# Patient Record
Sex: Female | Born: 1985 | Race: Black or African American | Hispanic: No | Marital: Single | Smoking: Former smoker
Health system: Southern US, Community
[De-identification: ages and names within clinical notes are randomized; demographics above are authoritative.]

## PROBLEM LIST (undated history)

## (undated) DIAGNOSIS — R059 Cough, unspecified: Secondary | ICD-10-CM

## (undated) DIAGNOSIS — J45909 Unspecified asthma, uncomplicated: Secondary | ICD-10-CM

## (undated) DIAGNOSIS — M5431 Sciatica, right side: Secondary | ICD-10-CM

## (undated) DIAGNOSIS — Z8619 Personal history of other infectious and parasitic diseases: Secondary | ICD-10-CM

## (undated) DIAGNOSIS — R05 Cough: Secondary | ICD-10-CM

## (undated) DIAGNOSIS — O24419 Gestational diabetes mellitus in pregnancy, unspecified control: Secondary | ICD-10-CM

## (undated) DIAGNOSIS — R0602 Shortness of breath: Secondary | ICD-10-CM

## (undated) DIAGNOSIS — M549 Dorsalgia, unspecified: Secondary | ICD-10-CM

## (undated) DIAGNOSIS — J449 Chronic obstructive pulmonary disease, unspecified: Secondary | ICD-10-CM

## (undated) DIAGNOSIS — R222 Localized swelling, mass and lump, trunk: Secondary | ICD-10-CM

## (undated) DIAGNOSIS — J40 Bronchitis, not specified as acute or chronic: Secondary | ICD-10-CM

## (undated) DIAGNOSIS — I8390 Asymptomatic varicose veins of unspecified lower extremity: Secondary | ICD-10-CM

## (undated) DIAGNOSIS — R079 Chest pain, unspecified: Secondary | ICD-10-CM

## (undated) DIAGNOSIS — D649 Anemia, unspecified: Secondary | ICD-10-CM

## (undated) DIAGNOSIS — E049 Nontoxic goiter, unspecified: Secondary | ICD-10-CM

## (undated) DIAGNOSIS — E739 Lactose intolerance, unspecified: Secondary | ICD-10-CM

## (undated) HISTORY — DX: Shortness of breath: R06.02

## (undated) HISTORY — DX: Anemia, unspecified: D64.9

## (undated) HISTORY — PX: TONSILLECTOMY: SUR1361

## (undated) HISTORY — DX: Dorsalgia, unspecified: M54.9

## (undated) HISTORY — PX: TONSILECTOMY, ADENOIDECTOMY, BILATERAL MYRINGOTOMY AND TUBES: SHX2538

## (undated) HISTORY — PX: ADENOIDECTOMY: SUR15

## (undated) HISTORY — DX: Nontoxic goiter, unspecified: E04.9

## (undated) HISTORY — DX: Chronic obstructive pulmonary disease, unspecified: J44.9

## (undated) HISTORY — DX: Chest pain, unspecified: R07.9

## (undated) HISTORY — DX: Asymptomatic varicose veins of unspecified lower extremity: I83.90

## (undated) HISTORY — DX: Bronchitis, not specified as acute or chronic: J40

## (undated) HISTORY — DX: Gestational diabetes mellitus in pregnancy, unspecified control: O24.419

## (undated) HISTORY — DX: Lactose intolerance, unspecified: E73.9

## (undated) HISTORY — DX: Localized swelling, mass and lump, trunk: R22.2

## (undated) HISTORY — DX: Personal history of other infectious and parasitic diseases: Z86.19

## (undated) HISTORY — DX: Unspecified asthma, uncomplicated: J45.909

## (undated) HISTORY — DX: Cough: R05

## (undated) HISTORY — DX: Cough, unspecified: R05.9

## (undated) HISTORY — DX: Sciatica, right side: M54.31

---

## 2006-05-05 ENCOUNTER — Emergency Department (HOSPITAL_COMMUNITY): Admission: EM | Admit: 2006-05-05 | Discharge: 2006-05-06 | Payer: Self-pay | Admitting: *Deleted

## 2006-11-01 ENCOUNTER — Emergency Department (HOSPITAL_COMMUNITY): Admission: EM | Admit: 2006-11-01 | Discharge: 2006-11-01 | Payer: Self-pay | Admitting: Emergency Medicine

## 2007-12-08 ENCOUNTER — Emergency Department (HOSPITAL_COMMUNITY): Admission: EM | Admit: 2007-12-08 | Discharge: 2007-12-08 | Payer: Self-pay | Admitting: Emergency Medicine

## 2008-03-20 ENCOUNTER — Emergency Department (HOSPITAL_COMMUNITY): Admission: EM | Admit: 2008-03-20 | Discharge: 2008-03-20 | Payer: Self-pay | Admitting: Emergency Medicine

## 2008-06-08 ENCOUNTER — Emergency Department (HOSPITAL_COMMUNITY): Admission: EM | Admit: 2008-06-08 | Discharge: 2008-06-08 | Payer: Self-pay | Admitting: Emergency Medicine

## 2009-12-01 DIAGNOSIS — Z8619 Personal history of other infectious and parasitic diseases: Secondary | ICD-10-CM

## 2009-12-01 HISTORY — DX: Personal history of other infectious and parasitic diseases: Z86.19

## 2010-12-01 NOTE — L&D Delivery Note (Signed)
Delivery Note At 1:25 PM a viable and healthy female was delivered via Vaginal, Spontaneous Delivery (Presentation: Right Occiput Anterior).  APGAR: , ; weight 7 lb 3.3 oz (3269 g).   Placenta status: Intact, Spontaneous.  Cord: 2 vessels with the following complications: None.  Anesthesia: Epidural Local   Lacerations: 2nd degree Suture Repair: 3.0 vicryl Est. Blood Loss (mL): 400  Mom to postpartum.  Baby to nursery-stable.  Denyce Harr D 08/19/2011, 1:55 PM

## 2011-01-16 LAB — RPR: RPR: NONREACTIVE

## 2011-01-16 LAB — ANTIBODY SCREEN: Antibody Screen: NEGATIVE

## 2011-01-16 LAB — GC/CHLAMYDIA PROBE AMP, GENITAL: Gonorrhea: POSITIVE

## 2011-01-16 LAB — ABO/RH

## 2011-01-16 LAB — HIV ANTIBODY (ROUTINE TESTING W REFLEX): HIV: NONREACTIVE

## 2011-02-18 ENCOUNTER — Other Ambulatory Visit (HOSPITAL_COMMUNITY): Payer: Self-pay | Admitting: Obstetrics and Gynecology

## 2011-02-18 DIAGNOSIS — O2690 Pregnancy related conditions, unspecified, unspecified trimester: Secondary | ICD-10-CM

## 2011-02-26 ENCOUNTER — Other Ambulatory Visit (HOSPITAL_COMMUNITY): Payer: Self-pay | Admitting: Obstetrics and Gynecology

## 2011-02-26 ENCOUNTER — Ambulatory Visit (HOSPITAL_COMMUNITY)
Admission: RE | Admit: 2011-02-26 | Discharge: 2011-02-26 | Disposition: A | Discharge status: Home / Self Care | Payer: BC Managed Care – PPO | Source: Ambulatory Visit | Attending: Obstetrics and Gynecology | Admitting: Obstetrics and Gynecology

## 2011-02-26 ENCOUNTER — Encounter (HOSPITAL_COMMUNITY): Payer: Self-pay

## 2011-02-26 DIAGNOSIS — O2690 Pregnancy related conditions, unspecified, unspecified trimester: Secondary | ICD-10-CM

## 2011-02-26 DIAGNOSIS — Z3689 Encounter for other specified antenatal screening: Secondary | ICD-10-CM | POA: Insufficient documentation

## 2011-03-19 ENCOUNTER — Ambulatory Visit (HOSPITAL_COMMUNITY)
Admission: RE | Admit: 2011-03-19 | Discharge: 2011-03-19 | Disposition: A | Discharge status: Home / Self Care | Payer: BC Managed Care – PPO | Source: Ambulatory Visit | Attending: Obstetrics and Gynecology | Admitting: Obstetrics and Gynecology

## 2011-03-19 ENCOUNTER — Other Ambulatory Visit (HOSPITAL_COMMUNITY): Payer: Self-pay | Admitting: Obstetrics and Gynecology

## 2011-03-19 DIAGNOSIS — Z363 Encounter for antenatal screening for malformations: Secondary | ICD-10-CM | POA: Insufficient documentation

## 2011-03-19 DIAGNOSIS — O2690 Pregnancy related conditions, unspecified, unspecified trimester: Secondary | ICD-10-CM

## 2011-03-19 DIAGNOSIS — O26879 Cervical shortening, unspecified trimester: Secondary | ICD-10-CM | POA: Insufficient documentation

## 2011-03-19 DIAGNOSIS — Z1389 Encounter for screening for other disorder: Secondary | ICD-10-CM | POA: Insufficient documentation

## 2011-03-19 DIAGNOSIS — O358XX0 Maternal care for other (suspected) fetal abnormality and damage, not applicable or unspecified: Secondary | ICD-10-CM | POA: Insufficient documentation

## 2011-04-23 ENCOUNTER — Ambulatory Visit (HOSPITAL_COMMUNITY)
Admission: RE | Admit: 2011-04-23 | Discharge: 2011-04-23 | Disposition: A | Discharge status: Home / Self Care | Payer: BC Managed Care – PPO | Source: Ambulatory Visit | Attending: Obstetrics and Gynecology | Admitting: Obstetrics and Gynecology

## 2011-04-23 DIAGNOSIS — O26879 Cervical shortening, unspecified trimester: Secondary | ICD-10-CM | POA: Insufficient documentation

## 2011-04-23 DIAGNOSIS — O358XX0 Maternal care for other (suspected) fetal abnormality and damage, not applicable or unspecified: Secondary | ICD-10-CM | POA: Insufficient documentation

## 2011-04-23 DIAGNOSIS — O2690 Pregnancy related conditions, unspecified, unspecified trimester: Secondary | ICD-10-CM

## 2011-07-28 ENCOUNTER — Emergency Department (HOSPITAL_COMMUNITY): Payer: BC Managed Care – PPO

## 2011-07-28 ENCOUNTER — Emergency Department (HOSPITAL_COMMUNITY)
Admission: EM | Admit: 2011-07-28 | Discharge: 2011-07-29 | Disposition: A | Discharge status: Home / Self Care | Payer: BC Managed Care – PPO | Attending: Emergency Medicine | Admitting: Emergency Medicine

## 2011-07-28 DIAGNOSIS — R059 Cough, unspecified: Secondary | ICD-10-CM | POA: Insufficient documentation

## 2011-07-28 DIAGNOSIS — R1110 Vomiting, unspecified: Secondary | ICD-10-CM | POA: Insufficient documentation

## 2011-07-28 DIAGNOSIS — J40 Bronchitis, not specified as acute or chronic: Secondary | ICD-10-CM | POA: Insufficient documentation

## 2011-07-28 DIAGNOSIS — R079 Chest pain, unspecified: Secondary | ICD-10-CM | POA: Insufficient documentation

## 2011-07-28 DIAGNOSIS — O99891 Other specified diseases and conditions complicating pregnancy: Secondary | ICD-10-CM | POA: Insufficient documentation

## 2011-07-28 DIAGNOSIS — R05 Cough: Secondary | ICD-10-CM | POA: Insufficient documentation

## 2011-07-28 DIAGNOSIS — R062 Wheezing: Secondary | ICD-10-CM | POA: Insufficient documentation

## 2011-07-28 DIAGNOSIS — R0602 Shortness of breath: Secondary | ICD-10-CM | POA: Insufficient documentation

## 2011-08-15 ENCOUNTER — Telehealth (HOSPITAL_COMMUNITY): Payer: Self-pay | Admitting: *Deleted

## 2011-08-15 ENCOUNTER — Encounter (HOSPITAL_COMMUNITY): Payer: Self-pay | Admitting: *Deleted

## 2011-08-15 NOTE — Telephone Encounter (Signed)
Preadmission screen  

## 2011-08-18 ENCOUNTER — Other Ambulatory Visit: Payer: Self-pay | Admitting: Obstetrics and Gynecology

## 2011-08-19 ENCOUNTER — Encounter (HOSPITAL_COMMUNITY): Payer: Self-pay

## 2011-08-19 ENCOUNTER — Inpatient Hospital Stay (HOSPITAL_COMMUNITY)
Admission: RE | Admit: 2011-08-19 | Discharge: 2011-08-21 | DRG: 373 | Disposition: A | Discharge status: Home / Self Care | Payer: BC Managed Care – PPO | Source: Ambulatory Visit | Attending: Obstetrics and Gynecology | Admitting: Obstetrics and Gynecology

## 2011-08-19 ENCOUNTER — Encounter (HOSPITAL_COMMUNITY): Payer: Self-pay | Admitting: Anesthesiology

## 2011-08-19 ENCOUNTER — Inpatient Hospital Stay (HOSPITAL_COMMUNITY): Payer: BC Managed Care – PPO | Admitting: Anesthesiology

## 2011-08-19 DIAGNOSIS — Z22330 Carrier of Group B streptococcus: Secondary | ICD-10-CM

## 2011-08-19 DIAGNOSIS — Z3490 Encounter for supervision of normal pregnancy, unspecified, unspecified trimester: Secondary | ICD-10-CM

## 2011-08-19 DIAGNOSIS — O99892 Other specified diseases and conditions complicating childbirth: Secondary | ICD-10-CM | POA: Diagnosis present

## 2011-08-19 LAB — CBC
Hemoglobin: 10.2 g/dL — ABNORMAL LOW (ref 12.0–15.0)
MCH: 26.4 pg (ref 26.0–34.0)
MCV: 82.6 fL (ref 78.0–100.0)
RBC: 3.86 MIL/uL — ABNORMAL LOW (ref 3.87–5.11)

## 2011-08-19 LAB — ABO/RH: ABO/RH(D): O POS

## 2011-08-19 LAB — RPR: RPR Ser Ql: NONREACTIVE

## 2011-08-19 MED ORDER — METHYLERGONOVINE MALEATE 0.2 MG/ML IJ SOLN: 0.2000 mg | INTRAMUSCULAR | Status: DC | PRN

## 2011-08-19 MED ORDER — EPHEDRINE 5 MG/ML INJ
10.0000 mg | INTRAVENOUS | Status: DC | PRN
  Filled 2011-08-19: qty 4

## 2011-08-19 MED ORDER — OXYTOCIN BOLUS FROM INFUSION
500.0000 mL | Freq: Once | INTRAVENOUS | Status: DC
  Filled 2011-08-19: qty 500

## 2011-08-19 MED ORDER — FENTANYL 2.5 MCG/ML BUPIVACAINE 1/10 % EPIDURAL INFUSION (WH - ANES)
INTRAMUSCULAR | Status: AC
  Filled 2011-08-19: qty 60

## 2011-08-19 MED ORDER — SENNOSIDES-DOCUSATE SODIUM 8.6-50 MG PO TABS
2.0000 | ORAL_TABLET | Freq: Every day | ORAL | Status: DC
  Administered 2011-08-19 – 2011-08-20 (×2): 2 via ORAL

## 2011-08-19 MED ORDER — OXYTOCIN 20 UNITS IN LACTATED RINGERS INFUSION - SIMPLE: 125.0000 mL/h | Freq: Once | INTRAVENOUS | Status: DC

## 2011-08-19 MED ORDER — PENICILLIN G POTASSIUM 5000000 UNITS IJ SOLR
2.5000 10*6.[IU] | INTRAMUSCULAR | Status: DC
  Filled 2011-08-19 (×5): qty 2.5

## 2011-08-19 MED ORDER — FENTANYL 2.5 MCG/ML BUPIVACAINE 1/10 % EPIDURAL INFUSION (WH - ANES)
INTRAMUSCULAR | Status: DC | PRN
  Administered 2011-08-19: 14 mL/h via EPIDURAL

## 2011-08-19 MED ORDER — ZOLPIDEM TARTRATE 5 MG PO TABS: 5.0000 mg | ORAL_TABLET | Freq: Every evening | ORAL | Status: DC | PRN

## 2011-08-19 MED ORDER — PENICILLIN G POTASSIUM 5000000 UNITS IJ SOLR
5.0000 10*6.[IU] | Freq: Once | INTRAVENOUS | Status: DC
  Filled 2011-08-19 (×2): qty 5

## 2011-08-19 MED ORDER — PHENYLEPHRINE 40 MCG/ML (10ML) SYRINGE FOR IV PUSH (FOR BLOOD PRESSURE SUPPORT)
PREFILLED_SYRINGE | INTRAVENOUS | Status: AC
  Filled 2011-08-19: qty 5

## 2011-08-19 MED ORDER — LACTATED RINGERS IV SOLN: 500.0000 mL | INTRAVENOUS | Status: DC | PRN

## 2011-08-19 MED ORDER — PRENATAL PLUS 27-1 MG PO TABS: 1.0000 | ORAL_TABLET | Freq: Every day | ORAL | Status: DC

## 2011-08-19 MED ORDER — ONDANSETRON HCL 4 MG PO TABS: 4.0000 mg | ORAL_TABLET | ORAL | Status: DC | PRN

## 2011-08-19 MED ORDER — SIMETHICONE 80 MG PO CHEW: 80.0000 mg | CHEWABLE_TABLET | ORAL | Status: DC | PRN

## 2011-08-19 MED ORDER — METHYLERGONOVINE MALEATE 0.2 MG PO TABS: 0.2000 mg | ORAL_TABLET | ORAL | Status: DC | PRN

## 2011-08-19 MED ORDER — DIPHENHYDRAMINE HCL 25 MG PO CAPS: 25.0000 mg | ORAL_CAPSULE | Freq: Four times a day (QID) | ORAL | Status: DC | PRN

## 2011-08-19 MED ORDER — LIDOCAINE HCL (PF) 1 % IJ SOLN
30.0000 mL | INTRAMUSCULAR | Status: DC | PRN
  Filled 2011-08-19 (×2): qty 30

## 2011-08-19 MED ORDER — ONDANSETRON HCL 4 MG/2ML IJ SOLN: 4.0000 mg | INTRAMUSCULAR | Status: DC | PRN

## 2011-08-19 MED ORDER — TERBUTALINE SULFATE 1 MG/ML IJ SOLN: 0.2500 mg | Freq: Once | INTRAMUSCULAR | Status: DC | PRN

## 2011-08-19 MED ORDER — LACTATED RINGERS IV SOLN: INTRAVENOUS | Status: DC

## 2011-08-19 MED ORDER — BENZOCAINE-MENTHOL 20-0.5 % EX AERO
INHALATION_SPRAY | CUTANEOUS | Status: AC
  Filled 2011-08-19: qty 56

## 2011-08-19 MED ORDER — MEASLES, MUMPS & RUBELLA VAC ~~LOC~~ INJ
0.5000 mL | INJECTION | Freq: Once | SUBCUTANEOUS | Status: DC
  Filled 2011-08-19: qty 0.5

## 2011-08-19 MED ORDER — IBUPROFEN 600 MG PO TABS
600.0000 mg | ORAL_TABLET | Freq: Four times a day (QID) | ORAL | Status: DC
  Administered 2011-08-20 – 2011-08-21 (×7): 600 mg via ORAL
  Filled 2011-08-19 (×7): qty 1

## 2011-08-19 MED ORDER — LACTATED RINGERS IV SOLN: 500.0000 mL | Freq: Once | INTRAVENOUS | Status: DC

## 2011-08-19 MED ORDER — LIDOCAINE HCL 1.5 % IJ SOLN
INTRAMUSCULAR | Status: DC | PRN
  Administered 2011-08-19 (×2): 5 mL via EPIDURAL

## 2011-08-19 MED ORDER — MAGNESIUM HYDROXIDE 400 MG/5ML PO SUSP: 30.0000 mL | ORAL | Status: DC | PRN

## 2011-08-19 MED ORDER — PHENYLEPHRINE 40 MCG/ML (10ML) SYRINGE FOR IV PUSH (FOR BLOOD PRESSURE SUPPORT)
80.0000 ug | PREFILLED_SYRINGE | INTRAVENOUS | Status: DC | PRN
  Administered 2011-08-19: 80 ug via INTRAVENOUS
  Filled 2011-08-19: qty 5

## 2011-08-19 MED ORDER — IBUPROFEN 600 MG PO TABS: 600.0000 mg | ORAL_TABLET | Freq: Four times a day (QID) | ORAL | Status: DC | PRN

## 2011-08-19 MED ORDER — DIPHENHYDRAMINE HCL 50 MG/ML IJ SOLN: 12.5000 mg | INTRAMUSCULAR | Status: DC | PRN

## 2011-08-19 MED ORDER — CITRIC ACID-SODIUM CITRATE 334-500 MG/5ML PO SOLN: 30.0000 mL | ORAL | Status: DC | PRN

## 2011-08-19 MED ORDER — ONDANSETRON HCL 4 MG/2ML IJ SOLN: 4.0000 mg | Freq: Four times a day (QID) | INTRAMUSCULAR | Status: DC | PRN

## 2011-08-19 MED ORDER — ACETAMINOPHEN 325 MG PO TABS: 650.0000 mg | ORAL_TABLET | ORAL | Status: DC | PRN

## 2011-08-19 MED ORDER — LANOLIN HYDROUS EX OINT: TOPICAL_OINTMENT | CUTANEOUS | Status: DC | PRN

## 2011-08-19 MED ORDER — ALBUTEROL SULFATE HFA 108 (90 BASE) MCG/ACT IN AERS
2.0000 | INHALATION_SPRAY | RESPIRATORY_TRACT | Status: DC | PRN
  Filled 2011-08-19: qty 6.7

## 2011-08-19 MED ORDER — BENZOCAINE-MENTHOL 20-0.5 % EX AERO: 1.0000 "application " | INHALATION_SPRAY | CUTANEOUS | Status: DC | PRN

## 2011-08-19 MED ORDER — FENTANYL 2.5 MCG/ML BUPIVACAINE 1/10 % EPIDURAL INFUSION (WH - ANES): 14.0000 mL/h | INTRAMUSCULAR | Status: DC

## 2011-08-19 MED ORDER — OXYCODONE-ACETAMINOPHEN 5-325 MG PO TABS: 2.0000 | ORAL_TABLET | ORAL | Status: DC | PRN

## 2011-08-19 MED ORDER — PRENATAL PLUS 27-1 MG PO TABS
1.0000 | ORAL_TABLET | Freq: Every day | ORAL | Status: DC
  Administered 2011-08-21: 1 via ORAL
  Filled 2011-08-19: qty 1

## 2011-08-19 MED ORDER — PHENYLEPHRINE 40 MCG/ML (10ML) SYRINGE FOR IV PUSH (FOR BLOOD PRESSURE SUPPORT)
80.0000 ug | PREFILLED_SYRINGE | INTRAVENOUS | Status: DC | PRN
  Filled 2011-08-19: qty 5

## 2011-08-19 MED ORDER — OXYTOCIN 20 UNITS IN LACTATED RINGERS INFUSION - SIMPLE
1.0000 m[IU]/min | INTRAVENOUS | Status: DC
  Administered 2011-08-19: 2 m[IU]/min via INTRAVENOUS
  Filled 2011-08-19: qty 1000

## 2011-08-19 MED ORDER — DIBUCAINE 1 % RE OINT: 1.0000 "application " | TOPICAL_OINTMENT | RECTAL | Status: DC | PRN

## 2011-08-19 MED ORDER — TETANUS-DIPHTH-ACELL PERTUSSIS 5-2.5-18.5 LF-MCG/0.5 IM SUSP
0.5000 mL | Freq: Once | INTRAMUSCULAR | Status: AC
  Administered 2011-08-20: 0.5 mL via INTRAMUSCULAR
  Filled 2011-08-19: qty 0.5

## 2011-08-19 MED ORDER — WITCH HAZEL-GLYCERIN EX PADS: 1.0000 "application " | MEDICATED_PAD | CUTANEOUS | Status: DC | PRN

## 2011-08-19 MED ORDER — OXYCODONE-ACETAMINOPHEN 5-325 MG PO TABS: 1.0000 | ORAL_TABLET | ORAL | Status: DC | PRN

## 2011-08-19 MED ORDER — EPHEDRINE 5 MG/ML INJ
INTRAVENOUS | Status: AC
  Filled 2011-08-19: qty 4

## 2011-08-19 NOTE — H&P (Signed)
Candice Kane is a 25 y.o. female presenting for induction of labor given 39+ weeks with favorable cervix.  PNC with possible early cystic hygroma that resolved, otherwise uncomplicated.  See prenatal records for complete history. Maternal Medical History:  Reason for admission: Induction of labor  Fetal activity: Perceived fetal activity is normal.    Prenatal complications: no prenatal complications   OB History    Grav Para Term Preterm Abortions TAB SAB Ect Mult Living   1              Past Medical History  Diagnosis Date  . History of chlamydia 2011  . Bronchitis     recent occurance   Past Surgical History  Procedure Date  . Hernia repair    Family History: family history includes COPD in her maternal grandfather; Cancer in her maternal grandmother and mother; and Diabetes in her father. Social History:  reports that she has never smoked. She has never used smokeless tobacco. She reports that she does not drink alcohol or use illicit drugs.  Review of Systems  Respiratory: Negative.   Cardiovascular: Negative.    On admission, VE 3/80/-1, vtx, AROM clear Dilation: 10 Effacement (%): 80 Station: +1 Exam by:: D Herr rn Blood pressure 151/86, pulse 85, temperature 98.4 F (36.9 C), temperature source Oral, resp. rate 22, height 6\' 1"  (1.854 m), weight 146.965 kg (324 lb), last menstrual period 11/15/2010, SpO2 100.00%. Maternal Exam:  Uterine Assessment: Contraction strength is firm.  Contraction frequency is regular.   Abdomen: Patient reports no abdominal tenderness. Estimated fetal weight is 8 1/2 lbs.   Fetal presentation: vertex  Introitus: Normal vulva. Normal vagina.    Physical Exam  Constitutional: She appears well-developed and well-nourished.  Neck: Neck supple. No thyromegaly present.  Cardiovascular: Normal rate, regular rhythm and normal heart sounds.   Respiratory: Effort normal and breath sounds normal.    Prenatal labs: ABO, Rh: O/Positive/--  (02/16 0000) Antibody: Negative (02/16 0000) Rubella:  Immune RPR: Nonreactive (02/16 0000)  HBsAg: Negative (02/16 0000)  HIV: Non-reactive (02/16 0000)  GBS: Positive (09/18 0000)   Assessment/Plan: IUP at 39+ weeks with favorable cervix for induction.  Has progressed well with pitocin and AROM, now complete and ready to push.  Anticipate SVD.  Has received PCN for +GBS.    Lima Chillemi D 08/19/2011, 1:03 PM

## 2011-08-19 NOTE — Anesthesia Preprocedure Evaluation (Signed)
Anesthesia Evaluation  Name, MR# and DOB Patient awake  General Assessment Comment  Reviewed: Allergy & Precautions, H&P , NPO status , Patient's Chart, lab work & pertinent test results  Airway Mallampati: II TM Distance: >3 FB     Dental  (+) Teeth Intact   Pulmonary  asthma COPD inhaler    pulmonary exam normalPulmonary Exam Normal     Cardiovascular     Neuro/Psych Negative Neurological ROS  Negative Psych ROS  GI/Hepatic/Renal negative GI ROS  negative Liver ROS  negative Renal ROS        Endo/Other  Negative Endocrine ROS (+)      Abdominal (+) obese,   Musculoskeletal negative musculoskeletal ROS (+)   Hematology negative hematology ROS (+)   Peds  Reproductive/Obstetrics (+) Pregnancy    Anesthesia Other Findings             Anesthesia Physical Anesthesia Plan  ASA: III  Anesthesia Plan: Epidural   Post-op Pain Management:    Induction:   Airway Management Planned:   Additional Equipment:   Intra-op Plan:   Post-operative Plan:   Informed Consent: I have reviewed the patients History and Physical, chart, labs and discussed the procedure including the risks, benefits and alternatives for the proposed anesthesia with the patient or authorized representative who has indicated his/her understanding and acceptance.     Plan Discussed with:   Anesthesia Plan Comments:         Anesthesia Quick Evaluation

## 2011-08-19 NOTE — Anesthesia Procedure Notes (Addendum)
Epidural Patient location during procedure: OB Start time: 08/19/2011 11:00 AM End time: 08/19/2011 11:08 AM Reason for block: procedure for pain  Staffing Anesthesiologist: Sandrea Hughs Performed by: anesthesiologist   Preanesthetic Checklist Completed: patient identified, site marked, surgical consent, pre-op evaluation, timeout performed, IV checked, risks and benefits discussed and monitors and equipment checked  Epidural Patient position: sitting Prep: site prepped and draped and DuraPrep Patient monitoring: continuous pulse ox and blood pressure Approach: midline Injection technique: LOR air  Needle:  Needle type: Tuohy  Needle gauge: 17 G Needle length: 9 cm Needle insertion depth: 9 cm Catheter type: closed end flexible Catheter size: 19 Gauge Catheter at skin depth: 15 cm Test dose: negative and 1.5% lidocaine  Assessment Sensory level: T10 Events: blood not aspirated, injection not painful, no injection resistance, negative IV test and no paresthesia

## 2011-08-20 NOTE — Progress Notes (Signed)
Afebrile, vss. Fundus firm, U/1. Small amt lochia, voiding without difficulty, + flatus.  Baby nursing for short periods of time.Plan for d/c tomorrow.

## 2011-08-20 NOTE — Progress Notes (Signed)
UR chart review completed.  

## 2011-08-20 NOTE — Anesthesia Postprocedure Evaluation (Signed)
  Anesthesia Post-op Note  Patient: Candice Kane  Procedure(s) Performed: * No procedures listed *  Patient Location: PACU and Mother/Baby  Anesthesia Type: Epidural  Level of Consciousness: awake, alert  and oriented  Airway and Oxygen Therapy: Patient Spontanous Breathing  Post-op Pain: mild  Post-op Assessment: Patient's Cardiovascular Status Stable, Respiratory Function Stable, No headache, No backache, No residual numbness and No residual motor weakness  Post-op Vital Signs: stable  Complications: No apparent anesthesia complications

## 2011-08-21 MED ORDER — IBUPROFEN 600 MG PO TABS: 600.0000 mg | ORAL_TABLET | Freq: Four times a day (QID) | ORAL | Status: AC

## 2011-08-21 NOTE — Progress Notes (Signed)
Pt feeling well, ready to go home. VSS, voiding without difficulty, no BM yet. Fundus U/2, firm. Small amt bleeding. Nursing well. Ibuprofen for cramping with good relief.

## 2011-08-21 NOTE — Discharge Summary (Signed)
Obstetric Discharge Summary Reason for Admission: induction of labor Prenatal Procedures: none Intrapartum Procedures: spontaneous vaginal delivery Postpartum Procedures: none Complications-Operative and Postpartum: 2nd degree perineal laceration Hemoglobin  Date Value Range Status  08/19/2011 10.2* 12.0-15.0 (g/dL) Final     HCT  Date Value Range Status  08/19/2011 31.9* 36.0-46.0 (%) Final    Discharge Diagnoses: Term Pregnancy-delivered  Discharge Information: Date: 08/21/2011 Activity: pelvic rest Diet: routine Medications: Ibuprophen Condition: stable Instructions: refer to practice specific booklet Discharge to: home Follow-up Information    Follow up with Sylvester Salonga D. Make an appointment in 6 weeks.   Contact information:   [address] [place] [address] [phone]          Newborn Data: Live born female  Birth Weight: 7 lb 3.3 oz (3269 g) APGAR: 9,   Home with mother.  Maisen Klingler D 08/21/2011, 8:17 AM

## 2011-08-22 NOTE — Anesthesia Postprocedure Evaluation (Signed)
Anesthesia Post Note  Patient: Candice Kane  Procedure(s) Performed: * No procedures listed *  Anesthesia type: Epidural  Patient location: Mother/Baby  Post pain: Pain level controlled  Post assessment: Post-op Vital signs reviewed  Last Vitals: There were no vitals filed for this visit.  Post vital signs: Reviewed  Level of consciousness: awake  Complications: No apparent anesthesia complications

## 2011-08-28 LAB — CBC
HCT: 32.6 — ABNORMAL LOW
Hemoglobin: 10.7 — ABNORMAL LOW
MCHC: 32.7
MCV: 84.6
RBC: 3.86 — ABNORMAL LOW
RDW: 14.7

## 2011-08-28 LAB — WET PREP, GENITAL
Trich, Wet Prep: NONE SEEN
Yeast Wet Prep HPF POC: NONE SEEN

## 2011-08-28 LAB — DIFFERENTIAL
Basophils Relative: 1
Eosinophils Absolute: 0
Eosinophils Relative: 0
Monocytes Absolute: 0.5
Monocytes Relative: 6

## 2011-08-28 LAB — POCT I-STAT, CHEM 8
Calcium, Ion: 1.2
Chloride: 101
Glucose, Bld: 75
HCT: 35 — ABNORMAL LOW
TCO2: 29

## 2011-08-28 LAB — URINALYSIS, ROUTINE W REFLEX MICROSCOPIC
Nitrite: NEGATIVE
Specific Gravity, Urine: 1.025
Urobilinogen, UA: 0.2
pH: 7

## 2012-02-18 ENCOUNTER — Encounter (HOSPITAL_COMMUNITY): Payer: Self-pay | Admitting: *Deleted

## 2012-02-18 ENCOUNTER — Emergency Department (HOSPITAL_COMMUNITY)
Admission: EM | Admit: 2012-02-18 | Discharge: 2012-02-18 | Disposition: A | Discharge status: Home / Self Care | Payer: BC Managed Care – PPO | Attending: Emergency Medicine | Admitting: Emergency Medicine

## 2012-02-18 DIAGNOSIS — J020 Streptococcal pharyngitis: Secondary | ICD-10-CM | POA: Insufficient documentation

## 2012-02-18 DIAGNOSIS — H9209 Otalgia, unspecified ear: Secondary | ICD-10-CM | POA: Insufficient documentation

## 2012-02-18 MED ORDER — PENICILLIN G BENZATHINE 1200000 UNIT/2ML IM SUSP
1.2000 10*6.[IU] | Freq: Once | INTRAMUSCULAR | Status: AC
  Administered 2012-02-18: 1.2 10*6.[IU] via INTRAMUSCULAR
  Filled 2012-02-18: qty 2

## 2012-02-18 MED ORDER — HYDROCODONE-ACETAMINOPHEN 7.5-500 MG/15ML PO SOLN: 10.0000 mL | Freq: Four times a day (QID) | ORAL | Status: AC | PRN

## 2012-02-18 NOTE — Discharge Instructions (Signed)
FOLLOW UP WITH YOUR DOCTOR FOR ANY PERSISTENT SYMPTOMS. CONTINUE TYLENOL FOR ACHES AND IF YOU DEVELOP ANY FEVER. PUSH FLUIDS. YOU CAN USE HYDROCODONE ELIXIR FOR PAIN AND RECOMMEND WARM SALT WATER GARGLES AS WELL.   Strep Throat Strep throat is an infection of the throat caused by a bacteria named Streptococcus pyogenes. Your caregiver may call the infection streptococcal "tonsillitis" or "pharyngitis" depending on whether there are signs of inflammation in the tonsils or back of the throat. Strep throat is most common in children from 84 to 28 years old during the cold months of the year, but it can occur in people of any age during any season. This infection is spread from person to person (contagious) through coughing, sneezing, or other close contact. SYMPTOMS   Fever or chills.   Painful, swollen, red tonsils or throat.   Pain or difficulty when swallowing.   White or yellow spots on the tonsils or throat.   Swollen, tender lymph nodes or "glands" of the neck or under the jaw.   Red rash all over the body (rare).  DIAGNOSIS  Many different infections can cause the same symptoms. A test must be done to confirm the diagnosis so the right treatment can be given. A "rapid strep test" can help your caregiver make the diagnosis in a few minutes. If this test is not available, a light swab of the infected area can be used for a throat culture test. If a throat culture test is done, results are usually available in a day or two. TREATMENT  Strep throat is treated with antibiotic medicine. HOME CARE INSTRUCTIONS   Gargle with 1 tsp of salt in 1 cup of warm water, 3 to 4 times per day or as needed for comfort.   Family members who also have a sore throat or fever should be tested for strep throat and treated with antibiotics if they have the strep infection.   Make sure everyone in your household washes their hands well.   Do not share food, drinking cups, or personal items that could cause the  infection to spread to others.   You may need to eat a soft food diet until your sore throat gets better.   Drink enough water and fluids to keep your urine clear or pale yellow. This will help prevent dehydration.   Get plenty of rest.   Stay home from school, daycare, or work until you have been on antibiotics for 24 hours.   Only take over-the-counter or prescription medicines for pain, discomfort, or fever as directed by your caregiver.   If antibiotics are prescribed, take them as directed. Finish them even if you start to feel better.  SEEK MEDICAL CARE IF:   The glands in your neck continue to enlarge.   You develop a rash, cough, or earache.   You cough up green, yellow-brown, or bloody sputum.   You have pain or discomfort not controlled by medicines.   Your problems seem to be getting worse rather than better.  SEEK IMMEDIATE MEDICAL CARE IF:   You develop any new symptoms such as vomiting, severe headache, stiff or painful neck, chest pain, shortness of breath, or trouble swallowing.   You develop severe throat pain, drooling, or changes in your voice.   You develop swelling of the neck, or the skin on the neck becomes red and tender.   You have a fever.   You develop signs of dehydration, such as fatigue, dry mouth, and decreased urination.  You become increasingly sleepy, or you cannot wake up completely.  Document Released: 11/14/2000 Document Revised: 11/06/2011 Document Reviewed: 01/16/2011 [facility] Patient Information 2012 [place], [place].

## 2012-02-18 NOTE — ED Notes (Signed)
Patient states bilateral ear pain dull and sore throat 7-10/10. Airway intact bilateral equal chest rise and fall. Ax4

## 2012-02-18 NOTE — ED Provider Notes (Signed)
History     CSN: [id number]  Arrival date & time 02/18/12  7829   First MD Initiated Contact with Patient 02/18/12 0757      Chief Complaint  Patient presents with  . Sore Throat  . Otalgia    (Consider location/radiation/quality/duration/timing/severity/associated sxs/prior treatment) Patient is a 26 y.o. female presenting with pharyngitis. The history is provided by the patient.  Sore Throat This is a new problem. The current episode started in the past 7 days. The problem occurs constantly. The problem has been gradually worsening. Associated symptoms include chills, congestion, coughing, fatigue, a sore throat and swollen glands. Pertinent negatives include no fever, nausea, rash or vomiting. Associated symptoms comments: She states other family members ill as well.. The symptoms are aggravated by swallowing. She has tried acetaminophen for the symptoms.    Past Medical History  Diagnosis Date  . History of chlamydia 2011  . Bronchitis     recent occurance    Past Surgical History  Procedure Date  . Hernia repair     Family History  Problem Relation Age of Onset  . Cancer Mother     breast  . Diabetes Father   . Cancer Maternal Grandmother     colon  . COPD Maternal Grandfather     History  Substance Use Topics  . Smoking status: Never Smoker   . Smokeless tobacco: Never Used  . Alcohol Use: No    OB History    Grav Para Term Preterm Abortions TAB SAB Ect Mult Living   1 1 1              Review of Systems  Constitutional: Positive for chills and fatigue. Negative for fever.  HENT: Positive for ear pain, congestion and sore throat.   Respiratory: Positive for cough. Negative for shortness of breath.   Cardiovascular: Negative.   Gastrointestinal: Negative.  Negative for nausea and vomiting.  Musculoskeletal: Negative.   Skin: Negative.  Negative for rash.  Neurological: Negative.     Allergies  Review of patient's allergies indicates no known  allergies.  Home Medications   Current Outpatient Rx  Name Route Sig Dispense Refill  . CHLORPHEN-PHENYLEPH-ASA 2-7.8-325 MG PO TBEF Oral Take 1 tablet by mouth 2 (two) times daily as needed. For sore throat    . PSEUDOEPH-DOXYLAMINE-DM-APAP 60-7.04-29-999 MG/30ML PO LIQD Oral Take 30 mLs by mouth at bedtime as needed. For cold      BP 138/84  Pulse 109  Temp(Src) 98.3 F (36.8 C) (Oral)  Resp 20  SpO2 96%  Breastfeeding? Unknown  Physical Exam  Constitutional: She appears well-developed and well-nourished.  HENT:  Head: Normocephalic.  Right Ear: Tympanic membrane, external ear and ear canal normal. No middle ear effusion.  Left Ear: Tympanic membrane, external ear and ear canal normal.  No middle ear effusion.  Mouth/Throat: Mucous membranes are not dry. Posterior oropharyngeal erythema present. No oropharyngeal exudate.  Neck: Normal range of motion. Neck supple.  Cardiovascular: Normal rate and regular rhythm.   Pulmonary/Chest: Effort normal and breath sounds normal.  Abdominal: Soft. Bowel sounds are normal. There is no tenderness. There is no rebound and no guarding.  Musculoskeletal: Normal range of motion.  Neurological: She is alert. No cranial nerve deficit.  Skin: Skin is warm and dry. No rash noted.  Psychiatric: She has a normal mood and affect.    ED Course  Procedures (including critical care time)  Labs Reviewed  RAPID STREP SCREEN - Abnormal; Notable for the following:  Streptococcus, Group A Screen (Direct) POSITIVE (*)    All other components within normal limits   No results found.   No diagnosis found.  1. Strep pharyngitis  MDM          Rodena Medin, PA-C 02/18/12 [phone]

## 2012-02-18 NOTE — ED Notes (Signed)
Pt reports sore throat, bilateral ear pain, and generalized body aches since Monday denies fevers, reports the entire house is sick.

## 2012-02-19 NOTE — ED Provider Notes (Signed)
Medical screening examination/treatment/procedure(s) were performed by non-physician practitioner and as supervising physician I was immediately available for consultation/collaboration.   Zyionna Pesce A. Patrica Duel, MD 02/19/12 1610

## 2013-03-01 DIAGNOSIS — E049 Nontoxic goiter, unspecified: Secondary | ICD-10-CM

## 2013-03-01 HISTORY — DX: Nontoxic goiter, unspecified: E04.9

## 2013-03-10 ENCOUNTER — Encounter: Payer: Self-pay | Admitting: Family Medicine

## 2013-03-10 ENCOUNTER — Ambulatory Visit (INDEPENDENT_AMBULATORY_CARE_PROVIDER_SITE_OTHER): Payer: BC Managed Care – PPO | Admitting: Family Medicine

## 2013-03-10 ENCOUNTER — Other Ambulatory Visit (HOSPITAL_COMMUNITY)
Admission: RE | Admit: 2013-03-10 | Discharge: 2013-03-10 | Disposition: A | Discharge status: Home / Self Care | Payer: BC Managed Care – PPO | Source: Ambulatory Visit | Attending: Family Medicine | Admitting: Family Medicine

## 2013-03-10 VITALS — BP 104/60 | HR 84 | Temp 98.9°F | Ht 72.5 in | Wt 308.0 lb

## 2013-03-10 DIAGNOSIS — Z0000 Encounter for general adult medical examination without abnormal findings: Secondary | ICD-10-CM

## 2013-03-10 DIAGNOSIS — I83893 Varicose 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 complications: Secondary | ICD-10-CM | POA: Insufficient documentation

## 2013-03-10 DIAGNOSIS — Z309 Encounter for contraceptive management, unspecified: Secondary | ICD-10-CM

## 2013-03-10 DIAGNOSIS — Z01419 Encounter for gynecological examination (general) (routine) without abnormal findings: Secondary | ICD-10-CM | POA: Insufficient documentation

## 2013-03-10 DIAGNOSIS — E049 Nontoxic goiter, unspecified: Secondary | ICD-10-CM | POA: Insufficient documentation

## 2013-03-10 DIAGNOSIS — Z124 Encounter for screening for malignant neoplasm of cervix: Secondary | ICD-10-CM

## 2013-03-10 DIAGNOSIS — D1779 Benign lipomatous neoplasm of other sites: Secondary | ICD-10-CM

## 2013-03-10 DIAGNOSIS — D171 Benign lipomatous neoplasm of skin and subcutaneous tissue of trunk: Secondary | ICD-10-CM

## 2013-03-10 DIAGNOSIS — IMO0001 Reserved for inherently not codable concepts without codable children: Secondary | ICD-10-CM

## 2013-03-10 LAB — HEPATIC FUNCTION PANEL
Bilirubin, Direct: 0 mg/dL (ref 0.0–0.3)
Total Bilirubin: 0.2 mg/dL — ABNORMAL LOW (ref 0.3–1.2)

## 2013-03-10 LAB — BASIC METABOLIC PANEL
BUN: 7 mg/dL (ref 6–23)
Calcium: 8.5 mg/dL (ref 8.4–10.5)
Chloride: 102 mEq/L (ref 96–112)
Creatinine, Ser: 0.8 mg/dL (ref 0.4–1.2)

## 2013-03-10 LAB — LIPID PANEL
HDL: 28.4 mg/dL — ABNORMAL LOW (ref >39.00)
LDL Cholesterol: 128 mg/dL — ABNORMAL HIGH (ref 0–99)
Total CHOL/HDL Ratio: 6
Triglycerides: 90 mg/dL (ref 0.0–149.0)

## 2013-03-10 LAB — CBC WITH DIFFERENTIAL/PLATELET
Eosinophils Absolute: 0.1 10*3/uL (ref 0.0–0.7)
Eosinophils Relative: 1.2 % (ref 0.0–5.0)
MCHC: 31.8 g/dL (ref 30.0–36.0)
MCV: 81 fl (ref 78.0–100.0)
Monocytes Absolute: 0.3 10*3/uL (ref 0.1–1.0)
Neutrophils Relative %: 53.8 % (ref 43.0–77.0)
Platelets: 366 10*3/uL (ref 150.0–400.0)
WBC: 5.4 10*3/uL (ref 4.5–10.5)

## 2013-03-10 LAB — POCT URINALYSIS DIPSTICK
Blood, UA: NEGATIVE
Nitrite, UA: NEGATIVE
Urobilinogen, UA: 0.2
pH, UA: 6.5

## 2013-03-10 MED ORDER — NORGESTIM-ETH ESTRAD TRIPHASIC 0.18/0.215/0.25 MG-25 MCG PO TABS: ORAL_TABLET | ORAL | Status: DC

## 2013-03-10 NOTE — Patient Instructions (Addendum)
Preventive Care for Adults, Female A healthy lifestyle and preventive care can promote health and wellness. Preventive health guidelines for women include the following key practices.  A routine yearly physical is a good way to check with your caregiver about your health and preventive screening. It is a chance to share any concerns and updates on your health, and to receive a thorough exam.  Visit your dentist for a routine exam and preventive care every 6 months. Brush your teeth twice a day and floss once a day. Good oral hygiene prevents tooth decay and gum disease.  The frequency of eye exams is based on your age, health, family medical history, use of contact lenses, and other factors. Follow your caregiver's recommendations for frequency of eye exams.  Eat a healthy diet. Foods like vegetables, fruits, whole grains, low-fat dairy products, and lean protein foods contain the nutrients you need without too many calories. Decrease your intake of foods high in solid fats, added sugars, and salt. Eat the right amount of calories for you.Get information about a proper diet from your caregiver, if necessary.  Regular physical exercise is one of the most important things you can do for your health. Most adults should get at least 150 minutes of moderate-intensity exercise (any activity that increases your heart rate and causes you to sweat) each week. In addition, most adults need muscle-strengthening exercises on 2 or more days a week.  Maintain a healthy weight. The body mass index (BMI) is a screening tool to identify possible weight problems. It provides an estimate of body fat based on height and weight. Your caregiver can help determine your BMI, and can help you achieve or maintain a healthy weight.For adults 20 years and older:  A BMI below 18.5 is considered underweight.  A BMI of 18.5 to 24.9 is normal.  A BMI of 25 to 29.9 is considered overweight.  A BMI of 30 and above is  considered obese.  Maintain normal blood lipids and cholesterol levels by exercising and minimizing your intake of saturated fat. Eat a balanced diet with plenty of fruit and vegetables. Blood tests for lipids and cholesterol should begin at age 20 and be repeated every 5 years. If your lipid or cholesterol levels are high, you are over 50, or you are at high risk for heart disease, you may need your cholesterol levels checked more frequently.Ongoing high lipid and cholesterol levels should be treated with medicines if diet and exercise are not effective.  If you smoke, find out from your caregiver how to quit. If you do not use tobacco, do not start.  If you are pregnant, do not drink alcohol. If you are breastfeeding, be very cautious about drinking alcohol. If you are not pregnant and choose to drink alcohol, do not exceed 1 drink per day. One drink is considered to be 12 ounces (355 mL) of beer, 5 ounces (148 mL) of wine, or 1.5 ounces (44 mL) of liquor.  Avoid use of street drugs. Do not share needles with anyone. Ask for help if you need support or instructions about stopping the use of drugs.  High blood pressure causes heart disease and increases the risk of stroke. Your blood pressure should be checked at least every 1 to 2 years. Ongoing high blood pressure should be treated with medicines if weight loss and exercise are not effective.  If you are 55 to 27 years old, ask your caregiver if you should take aspirin to prevent strokes.  Diabetes   screening involves taking a blood sample to check your fasting blood sugar level. This should be done once every 3 years, after age 45, if you are within normal weight and without risk factors for diabetes. Testing should be considered at a younger age or be carried out more frequently if you are overweight and have at least 1 risk factor for diabetes.  Breast cancer screening is essential preventive care for women. You should practice "breast  self-awareness." This means understanding the normal appearance and feel of your breasts and may include breast self-examination. Any changes detected, no matter how small, should be reported to a caregiver. Women in their 20s and 30s should have a clinical breast exam (CBE) by a caregiver as part of a regular health exam every 1 to 3 years. After age 40, women should have a CBE every year. Starting at age 40, women should consider having a mammography (breast X-ray test) every year. Women who have a family history of breast cancer should talk to their caregiver about genetic screening. Women at a high risk of breast cancer should talk to their caregivers about having magnetic resonance imaging (MRI) and a mammography every year.  The Pap test is a screening test for cervical cancer. A Pap test can show cell changes on the cervix that might become cervical cancer if left untreated. A Pap test is a procedure in which cells are obtained and examined from the lower end of the uterus (cervix).  Women should have a Pap test starting at age 21.  Between ages 21 and 29, Pap tests should be repeated every 2 years.  Beginning at age 30, you should have a Pap test every 3 years as long as the past 3 Pap tests have been normal.  Some women have medical problems that increase the chance of getting cervical cancer. Talk to your caregiver about these problems. It is especially important to talk to your caregiver if a new problem develops soon after your last Pap test. In these cases, your caregiver may recommend more frequent screening and Pap tests.  The above recommendations are the same for women who have or have not gotten the vaccine for human papillomavirus (HPV).  If you had a hysterectomy for a problem that was not cancer or a condition that could lead to cancer, then you no longer need Pap tests. Even if you no longer need a Pap test, a regular exam is a good idea to make sure no other problems are  starting.  If you are between ages 65 and 70, and you have had normal Pap tests going back 10 years, you no longer need Pap tests. Even if you no longer need a Pap test, a regular exam is a good idea to make sure no other problems are starting.  If you have had past treatment for cervical cancer or a condition that could lead to cancer, you need Pap tests and screening for cancer for at least 20 years after your treatment.  If Pap tests have been discontinued, risk factors (such as a new sexual partner) need to be reassessed to determine if screening should be resumed.  The HPV test is an additional test that may be used for cervical cancer screening. The HPV test looks for the virus that can cause the cell changes on the cervix. The cells collected during the Pap test can be tested for HPV. The HPV test could be used to screen women aged 30 years and older, and should   be used in women of any age who have unclear Pap test results. After the age of 30, women should have HPV testing at the same frequency as a Pap test.  Colorectal cancer can be detected and often prevented. Most routine colorectal cancer screening begins at the age of 50 and continues through age 75. However, your caregiver may recommend screening at an earlier age if you have risk factors for colon cancer. On a yearly basis, your caregiver may provide home test kits to check for hidden blood in the stool. Use of a small camera at the end of a tube, to directly examine the colon (sigmoidoscopy or colonoscopy), can detect the earliest forms of colorectal cancer. Talk to your caregiver about this at age 50, when routine screening begins. Direct examination of the colon should be repeated every 5 to 10 years through age 75, unless early forms of pre-cancerous polyps or small growths are found.  Hepatitis C blood testing is recommended for all people born from 1945 through 1965 and any individual with known risks for hepatitis C.  Practice  safe sex. Use condoms and avoid high-risk sexual practices to reduce the spread of sexually transmitted infections (STIs). STIs include gonorrhea, chlamydia, syphilis, trichomonas, herpes, HPV, and human immunodeficiency virus (HIV). Herpes, HIV, and HPV are viral illnesses that have no cure. They can result in disability, cancer, and death. Sexually active women aged 25 and younger should be checked for chlamydia. Older women with new or multiple partners should also be tested for chlamydia. Testing for other STIs is recommended if you are sexually active and at increased risk.  Osteoporosis is a disease in which the bones lose minerals and strength with aging. This can result in serious bone fractures. The risk of osteoporosis can be identified using a bone density scan. Women ages 65 and over and women at risk for fractures or osteoporosis should discuss screening with their caregivers. Ask your caregiver whether you should take a calcium supplement or vitamin D to reduce the rate of osteoporosis.  Menopause can be associated with physical symptoms and risks. Hormone replacement therapy is available to decrease symptoms and risks. You should talk to your caregiver about whether hormone replacement therapy is right for you.  Use sunscreen with sun protection factor (SPF) of 30 or more. Apply sunscreen liberally and repeatedly throughout the day. You should seek shade when your shadow is shorter than you. Protect yourself by wearing long sleeves, pants, a wide-brimmed hat, and sunglasses year round, whenever you are outdoors.  Once a month, do a whole body skin exam, using a mirror to look at the skin on your back. Notify your caregiver of new moles, moles that have irregular borders, moles that are larger than a pencil eraser, or moles that have changed in shape or color.  Stay current with required immunizations.  Influenza. You need a dose every fall (or winter). The composition of the flu vaccine  changes each year, so being vaccinated once is not enough.  Pneumococcal polysaccharide. You need 1 to 2 doses if you smoke cigarettes or if you have certain chronic medical conditions. You need 1 dose at age 65 (or older) if you have never been vaccinated.  Tetanus, diphtheria, pertussis (Tdap, Td). Get 1 dose of Tdap vaccine if you are younger than age 65, are over 65 and have contact with an infant, are a healthcare worker, are pregnant, or simply want to be protected from whooping cough. After that, you need a Td   booster dose every 10 years. Consult your caregiver if you have not had at least 3 tetanus and diphtheria-containing shots sometime in your life or have a deep or dirty wound.  HPV. You need this vaccine if you are a woman age 26 or younger. The vaccine is given in 3 doses over 6 months.  Measles, mumps, rubella (MMR). You need at least 1 dose of MMR if you were born in 1957 or later. You may also need a second dose.  Meningococcal. If you are age 19 to 21 and a first-year college student living in a residence hall, or have one of several medical conditions, you need to get vaccinated against meningococcal disease. You may also need additional booster doses.  Zoster (shingles). If you are age 60 or older, you should get this vaccine.  Varicella (chickenpox). If you have never had chickenpox or you were vaccinated but received only 1 dose, talk to your caregiver to find out if you need this vaccine.  Hepatitis A. You need this vaccine if you have a specific risk factor for hepatitis A virus infection or you simply wish to be protected from this disease. The vaccine is usually given as 2 doses, 6 to 18 months apart.  Hepatitis B. You need this vaccine if you have a specific risk factor for hepatitis B virus infection or you simply wish to be protected from this disease. The vaccine is given in 3 doses, usually over 6 months. Preventive Services / Frequency Ages 19 to 39  Blood  pressure check.** / Every 1 to 2 years.  Lipid and cholesterol check.** / Every 5 years beginning at age 20.  Clinical breast exam.** / Every 3 years for women in their 20s and 30s.  Pap test.** / Every 2 years from ages 21 through 29. Every 3 years starting at age 30 through age 65 or 70 with a history of 3 consecutive normal Pap tests.  HPV screening.** / Every 3 years from ages 30 through ages 65 to 70 with a history of 3 consecutive normal Pap tests.  Hepatitis C blood test.** / For any individual with known risks for hepatitis C.  Skin self-exam. / Monthly.  Influenza immunization.** / Every year.  Pneumococcal polysaccharide immunization.** / 1 to 2 doses if you smoke cigarettes or if you have certain chronic medical conditions.  Tetanus, diphtheria, pertussis (Tdap, Td) immunization. / A one-time dose of Tdap vaccine. After that, you need a Td booster dose every 10 years.  HPV immunization. / 3 doses over 6 months, if you are 26 and younger.  Measles, mumps, rubella (MMR) immunization. / You need at least 1 dose of MMR if you were born in 1957 or later. You may also need a second dose.  Meningococcal immunization. / 1 dose if you are age 19 to 21 and a first-year college student living in a residence hall, or have one of several medical conditions, you need to get vaccinated against meningococcal disease. You may also need additional booster doses.  Varicella immunization.** / Consult your caregiver.  Hepatitis A immunization.** / Consult your caregiver. 2 doses, 6 to 18 months apart.  Hepatitis B immunization.** / Consult your caregiver. 3 doses usually over 6 months. Ages 40 to 64  Blood pressure check.** / Every 1 to 2 years.  Lipid and cholesterol check.** / Every 5 years beginning at age 20.  Clinical breast exam.** / Every year after age 40.  Mammogram.** / Every year beginning at age 40   and continuing for as long as you are in good health. Consult with your  caregiver.  Pap test.** / Every 3 years starting at age 30 through age 65 or 70 with a history of 3 consecutive normal Pap tests.  HPV screening.** / Every 3 years from ages 30 through ages 65 to 70 with a history of 3 consecutive normal Pap tests.  Fecal occult blood test (FOBT) of stool. / Every year beginning at age 50 and continuing until age 75. You may not need to do this test if you get a colonoscopy every 10 years.  Flexible sigmoidoscopy or colonoscopy.** / Every 5 years for a flexible sigmoidoscopy or every 10 years for a colonoscopy beginning at age 50 and continuing until age 75.  Hepatitis C blood test.** / For all people born from 1945 through 1965 and any individual with known risks for hepatitis C.  Skin self-exam. / Monthly.  Influenza immunization.** / Every year.  Pneumococcal polysaccharide immunization.** / 1 to 2 doses if you smoke cigarettes or if you have certain chronic medical conditions.  Tetanus, diphtheria, pertussis (Tdap, Td) immunization.** / A one-time dose of Tdap vaccine. After that, you need a Td booster dose every 10 years.  Measles, mumps, rubella (MMR) immunization. / You need at least 1 dose of MMR if you were born in 1957 or later. You may also need a second dose.  Varicella immunization.** / Consult your caregiver.  Meningococcal immunization.** / Consult your caregiver.  Hepatitis A immunization.** / Consult your caregiver. 2 doses, 6 to 18 months apart.  Hepatitis B immunization.** / Consult your caregiver. 3 doses, usually over 6 months. Ages 65 and over  Blood pressure check.** / Every 1 to 2 years.  Lipid and cholesterol check.** / Every 5 years beginning at age 20.  Clinical breast exam.** / Every year after age 40.  Mammogram.** / Every year beginning at age 40 and continuing for as long as you are in good health. Consult with your caregiver.  Pap test.** / Every 3 years starting at age 30 through age 65 or 70 with a 3  consecutive normal Pap tests. Testing can be stopped between 65 and 70 with 3 consecutive normal Pap tests and no abnormal Pap or HPV tests in the past 10 years.  HPV screening.** / Every 3 years from ages 30 through ages 65 or 70 with a history of 3 consecutive normal Pap tests. Testing can be stopped between 65 and 70 with 3 consecutive normal Pap tests and no abnormal Pap or HPV tests in the past 10 years.  Fecal occult blood test (FOBT) of stool. / Every year beginning at age 50 and continuing until age 75. You may not need to do this test if you get a colonoscopy every 10 years.  Flexible sigmoidoscopy or colonoscopy.** / Every 5 years for a flexible sigmoidoscopy or every 10 years for a colonoscopy beginning at age 50 and continuing until age 75.  Hepatitis C blood test.** / For all people born from 1945 through 1965 and any individual with known risks for hepatitis C.  Osteoporosis screening.** / A one-time screening for women ages 65 and over and women at risk for fractures or osteoporosis.  Skin self-exam. / Monthly.  Influenza immunization.** / Every year.  Pneumococcal polysaccharide immunization.** / 1 dose at age 65 (or older) if you have never been vaccinated.  Tetanus, diphtheria, pertussis (Tdap, Td) immunization. / A one-time dose of Tdap vaccine if you are over   65 and have contact with an infant, are a healthcare worker, or simply want to be protected from whooping cough. After that, you need a Td booster dose every 10 years.  Varicella immunization.** / Consult your caregiver.  Meningococcal immunization.** / Consult your caregiver.  Hepatitis A immunization.** / Consult your caregiver. 2 doses, 6 to 18 months apart.  Hepatitis B immunization.** / Check with your caregiver. 3 doses, usually over 6 months. ** Family history and personal history of risk and conditions may change your caregiver's recommendations. Document Released: 01/13/2002 Document Revised: 02/09/2012  Document Reviewed: 04/14/2011 Exit[employer] Patient Information 2013 [employer].  

## 2013-03-10 NOTE — Assessment & Plan Note (Signed)
US thyroid and check labs

## 2013-03-10 NOTE — Assessment & Plan Note (Signed)
Causing pain Refer to surgery

## 2013-03-10 NOTE — Assessment & Plan Note (Signed)
Pt given rx for compression hose Refer to vascular

## 2013-03-10 NOTE — Progress Notes (Signed)
Subjective:     Candice Kane is a 27 y.o. female and is here for a comprehensive physical exam. The patient reports problems - lump on low back R side.  History   Social History  . Marital Status: Single    Spouse Name: N/A    Number of Children: N/A  . Years of Education: N/A   Occupational History  . [employer]-- Therapist, occupational    Social History Main Topics  . Smoking status: Never Smoker   . Smokeless tobacco: Never Used  . Alcohol Use: 0.0 oz/week     Comment: rare  . Drug Use: No  . Sexually Active: Yes   Other Topics Concern  . Not on file   Social History Narrative   Exercise-- yes, walking 3x a week   Health Maintenance  Topic Date Due  . Pap Smear  08/06/2004  . Influenza Vaccine  08/01/2013  . Tetanus/tdap  08/19/2021    The following portions of the patient's history were reviewed and updated as appropriate:  She  has a past medical history of History of chlamydia (2011) and Bronchitis. She  does not have any pertinent problems on file. She  has no past surgical history on file. Her family history includes COPD in her maternal grandfather; Cancer in her maternal grandmother and mother; Diabetes in her father; Hyperlipidemia in her father; and Hypertension in her father. She  reports that she has never smoked. She has never used smokeless tobacco. She reports that  drinks alcohol. She reports that she does not use illicit drugs. She has a current medication list which includes the following prescription(s): norgestimate-ethinyl estradiol triphasic. Current Outpatient Prescriptions on File Prior to Visit  Medication Sig Dispense Refill  . [DISCONTINUED] albuterol (PROVENTIL HFA;VENTOLIN HFA) 108 (90 BASE) MCG/ACT inhaler Inhale 2 puffs into the lungs every 4 (four) hours as needed. For acute bronchitis        No current facility-administered medications on file prior to visit.   She has No Known Allergies..  Review of Systems Review of Systems   Constitutional: Negative for activity change, appetite change and fatigue.  HENT: Negative for hearing loss, congestion, tinnitus and ear discharge.  dentist q90m Eyes: Negative for visual disturbance (see optho q1y -- vision corrected to 20/20 with glasses).  Respiratory: Negative for cough, chest tightness and shortness of breath.   Cardiovascular: Negative for chest pain, palpitations and leg swelling.  Gastrointestinal: Negative for abdominal pain, diarrhea, constipation and abdominal distention.  Genitourinary: Negative for urgency, frequency, decreased urine volume and difficulty urinating.  Musculoskeletal: Negative for back pain, arthralgias and gait problem.  Skin: Negative for color change, pallor and rash.  Neurological: Negative for dizziness, light-headedness, numbness and headaches.  Hematological: Negative for adenopathy. Does not bruise/bleed easily.  Psychiatric/Behavioral: Negative for suicidal ideas, confusion, sleep disturbance, self-injury, dysphoric mood, decreased concentration and agitation.       Objective:    BP 104/60  Pulse 84  Temp(Src) 98.9 F (37.2 C) (Oral)  Ht 6' 0.5" (1.842 m)  Wt 308 lb (139.708 kg)  BMI 41.18 kg/m2  SpO2 99%  LMP 02/13/2013  Breastfeeding? No General appearance: alert, cooperative, appears stated age and no distress Head: Normocephalic, without obvious abnormality, atraumatic Eyes: conjunctivae/corneas clear. PERRL, EOM's intact. Fundi benign. Ears: normal TM's and external ear canals both ears Nose: Nares normal. Septum midline. Mucosa normal. No drainage or sinus tenderness. Throat: lips, mucosa, and tongue normal; teeth and gums normal Neck:  + thyroid enlarged,  supple Back: symmetric, no curvature. ROM normal. No CVA tenderness. Lungs: clear to auscultation bilaterally Breasts: normal appearance, no masses or tenderness Heart: regular rate and rhythm, S1, S2 normal, no murmur, click, rub or gallop Abdomen: soft,  non-tender; bowel sounds normal; no masses,  no organomegaly Pelvic: cervix normal in appearance, external genitalia normal, no adnexal masses or tenderness, no cervical motion tenderness, rectovaginal septum normal, uterus normal size, shape, and consistency and vagina normal without discharge, pap done Extremities: extremities normal, atraumatic, no cyanosis or edema--+ varicose veins---b/l  Pulses: 2+ and symmetric Skin: Skin color, texture, turgor normal. No rashes or lesions Lymph nodes: Cervical, supraclavicular, and axillary nodes normal. Neurologic: Alert and oriented X 3, normal strength and tone. Normal symmetric reflexes. Normal coordination and gait Psych-- no depression, no anxiety      Assessment:    Healthy female exam.     Plan:    ghm utd check See After Visit Summary for Counseling Recommendations

## 2013-03-11 ENCOUNTER — Other Ambulatory Visit: Payer: Self-pay | Admitting: *Deleted

## 2013-03-11 ENCOUNTER — Telehealth: Payer: Self-pay | Admitting: Family Medicine

## 2013-03-11 DIAGNOSIS — I83893 Varicose 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 complications: Secondary | ICD-10-CM

## 2013-03-11 MED ORDER — NORGESTIM-ETH ESTRAD TRIPHASIC 0.18/0.215/0.25 MG-25 MCG PO TABS: 1.0000 | ORAL_TABLET | Freq: Every day | ORAL | Status: DC

## 2013-03-11 NOTE — Telephone Encounter (Signed)
Please advise      KP 

## 2013-03-11 NOTE — Telephone Encounter (Signed)
Rx faxed for 1 mo with 11 refills instead of 3 mos to see if this helps with the cost, patient said it was expensive when she went to pick it up. I made her aware to check the price with the new direction and let us know if this works for her or if we need to try a different pharmacy, she agreed    [facility]

## 2013-03-11 NOTE — Telephone Encounter (Signed)
Patient states she told Dr. Laury Axon she was taking orthotricyclen low, but when she got home she realized it was actually the generic for tri sprintec. She would like her rx switched back to generic tri sprintec. Patient uses [facility] [address]

## 2013-03-11 NOTE — Telephone Encounter (Signed)
Ok to fill  For 1 year

## 2013-03-23 ENCOUNTER — Ambulatory Visit (INDEPENDENT_AMBULATORY_CARE_PROVIDER_SITE_OTHER): Payer: Self-pay | Admitting: Surgery

## 2013-03-23 IMAGING — US US OB TRANSVAGINAL
1 series · 14 of 28 positions shown · non-contrast
Comparison: none

[Series 1: us ob transvaginal · 0.24mm/px · 14 of 75 slices shown]
[im 3/75]
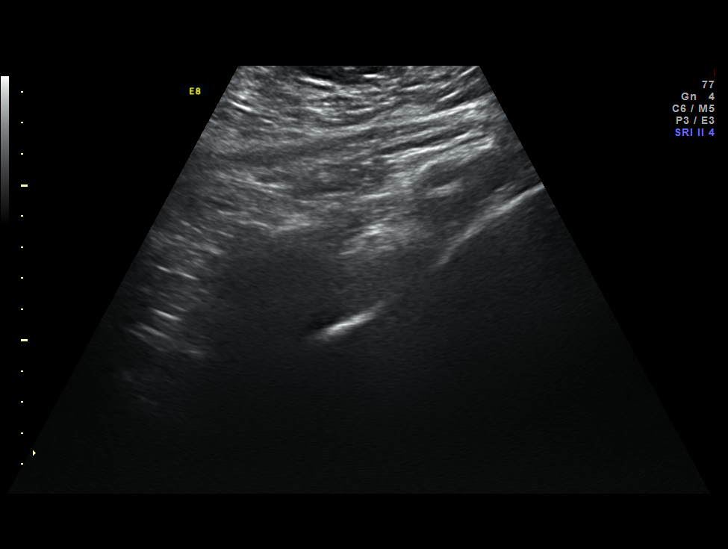
[im 9/75]
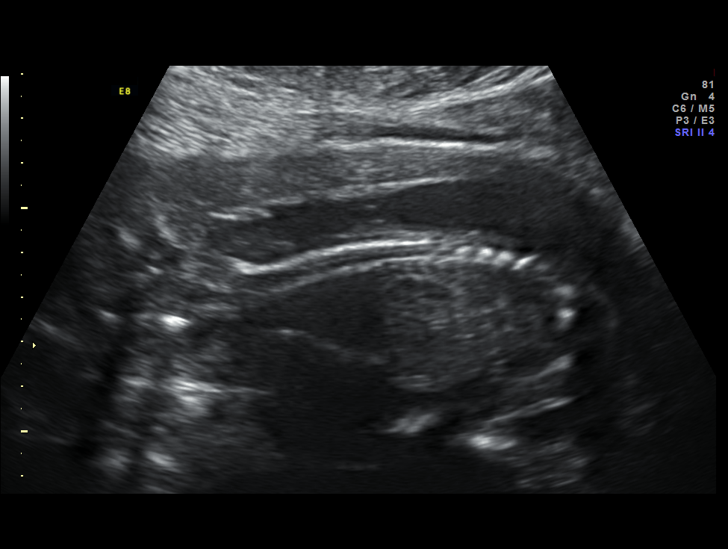
[im 14/75]
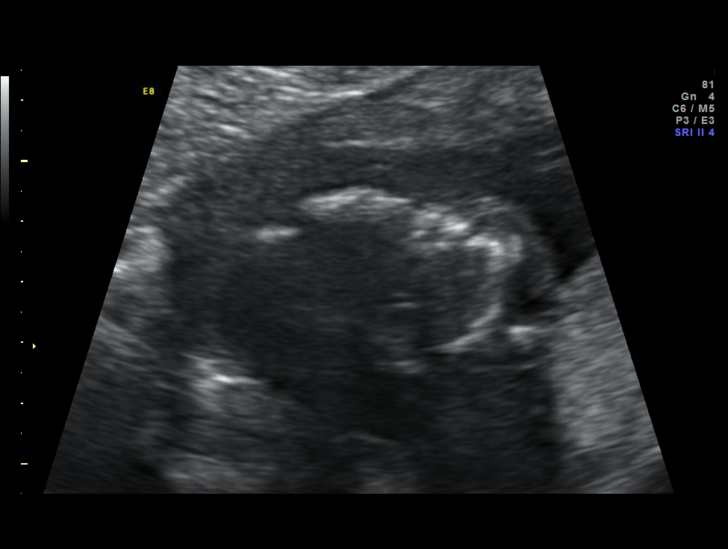
[im 20/75]
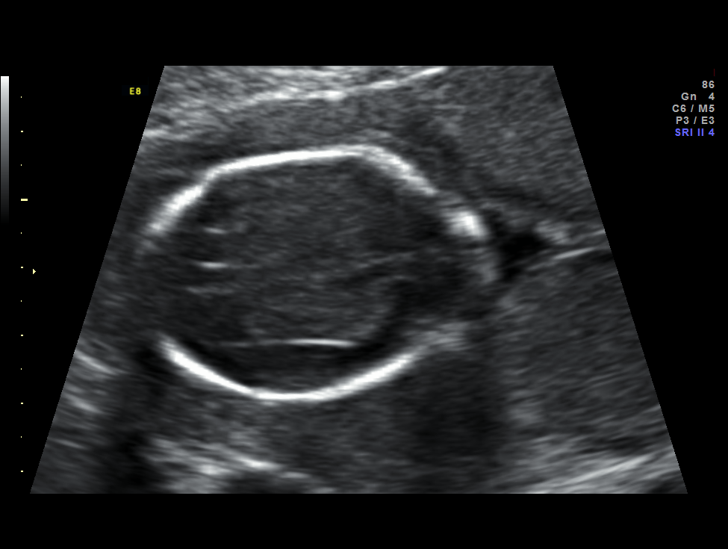
[im 25/75]
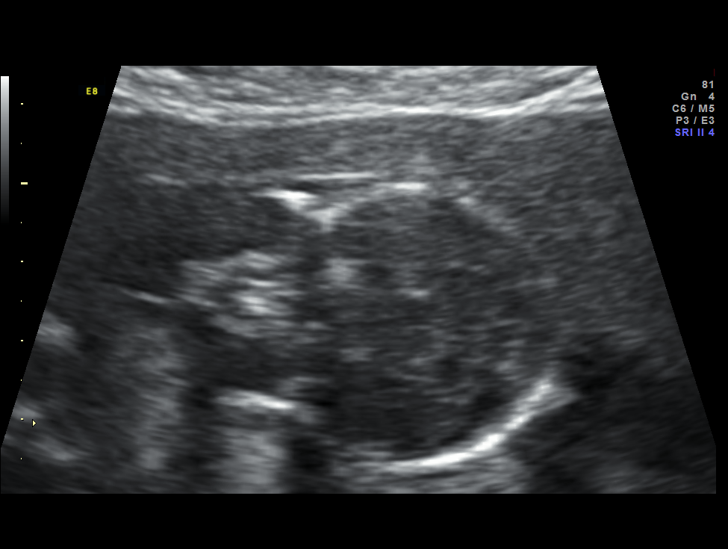
[im 31/75]
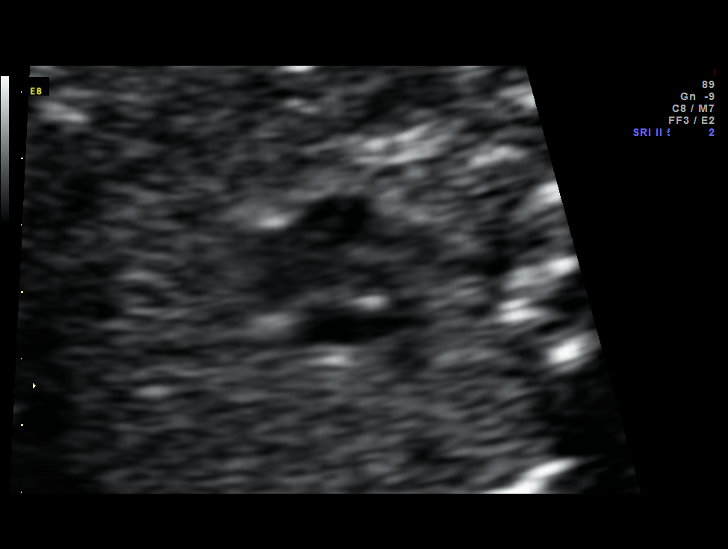
[im 36/75]
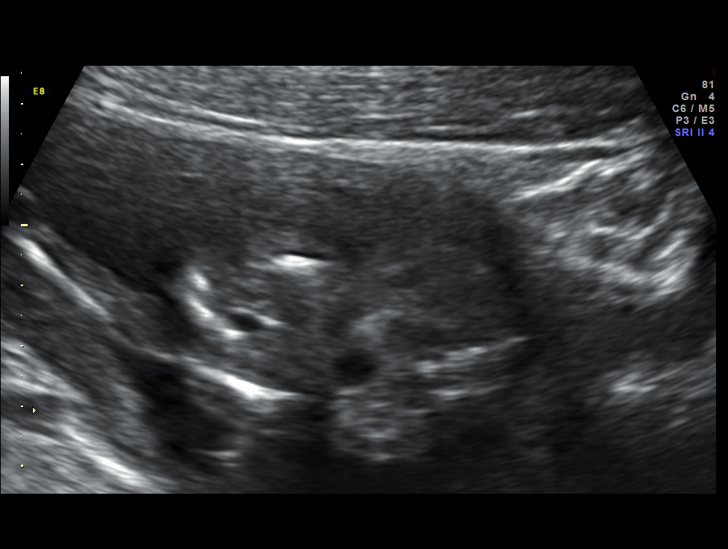
[im 42/75]
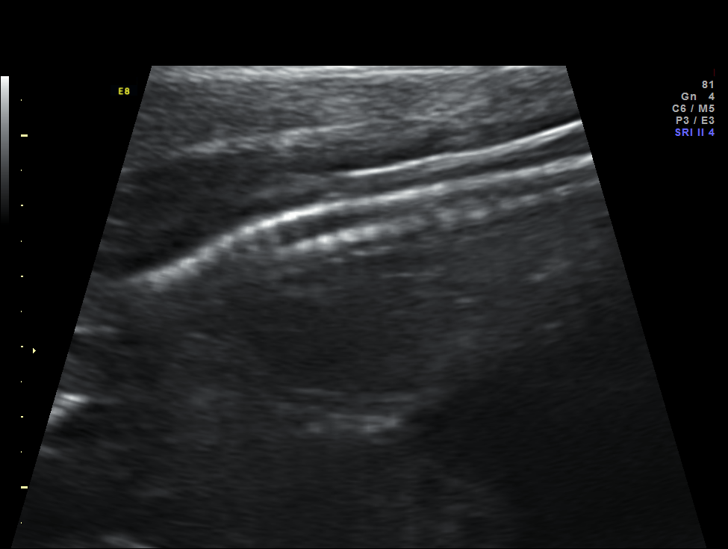
[im 47/75]
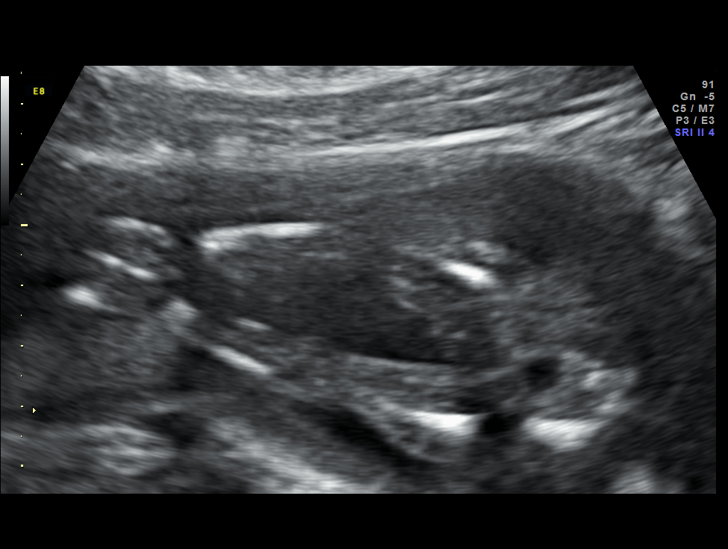
[im 53/75]
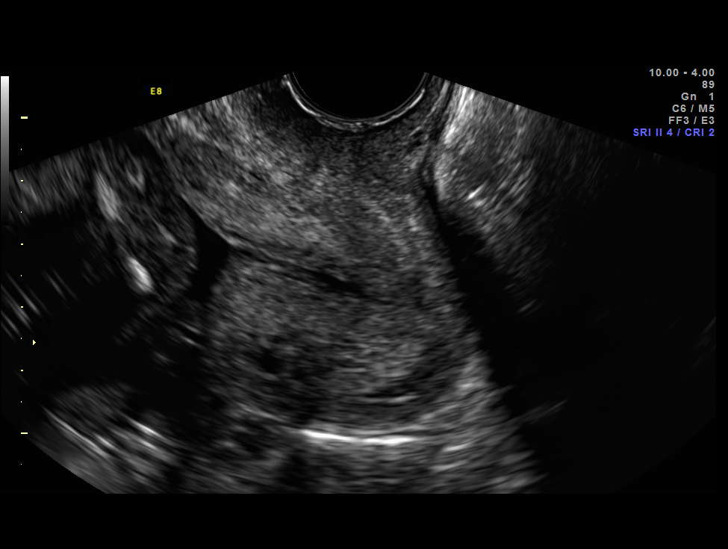
[im 58/75]
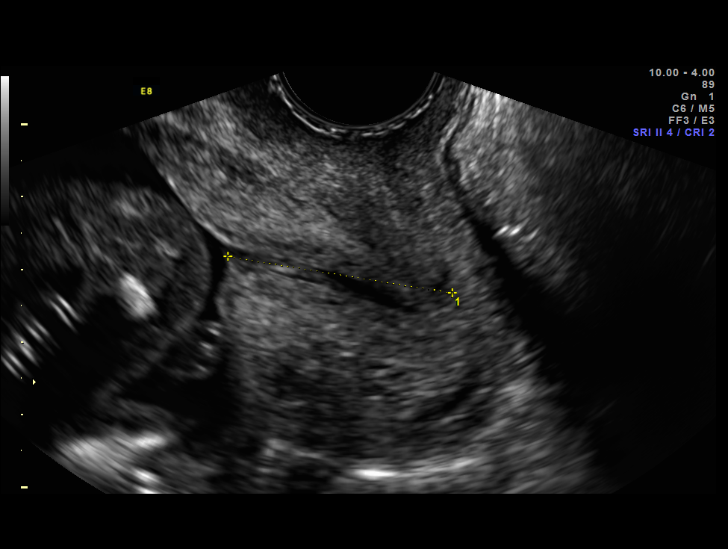
[im 64/75]
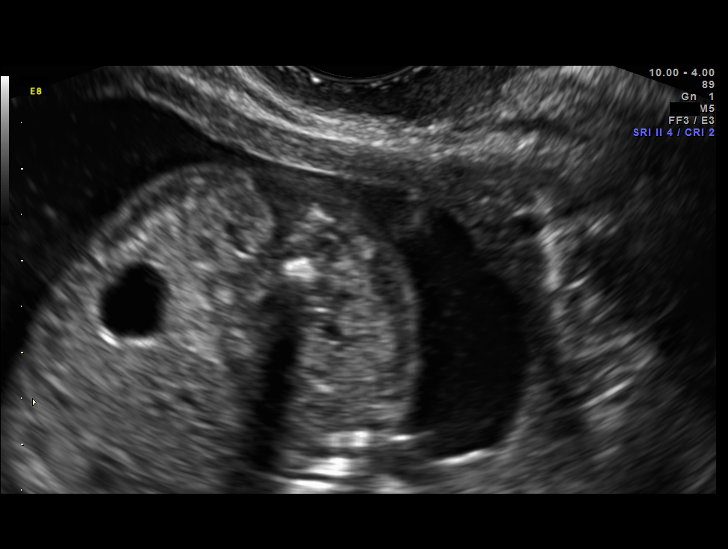
[im 69/75]
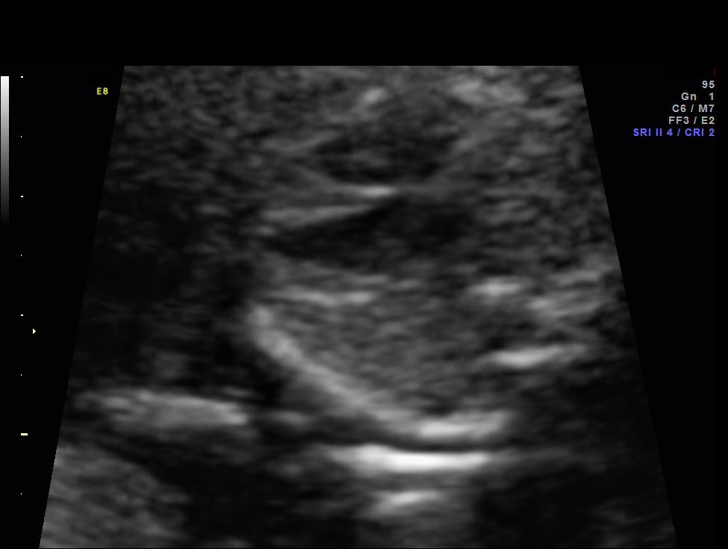
[im 75/75]
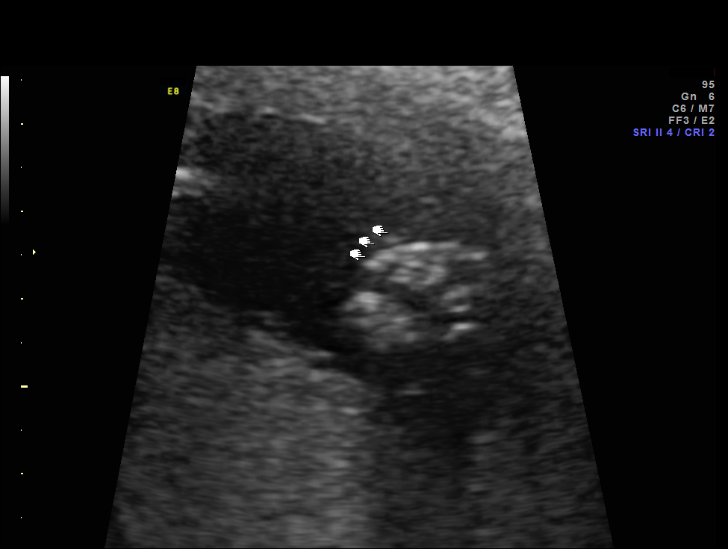

[14 of 28 positions shown; findings below may reference images not displayed]

Canned report from images found in remote index.

Refer to host system for actual result text.

## 2013-03-25 ENCOUNTER — Encounter: Payer: Self-pay | Admitting: Vascular Surgery

## 2013-03-31 ENCOUNTER — Other Ambulatory Visit (HOSPITAL_BASED_OUTPATIENT_CLINIC_OR_DEPARTMENT_OTHER): Payer: BC Managed Care – PPO

## 2013-03-31 ENCOUNTER — Ambulatory Visit (HOSPITAL_BASED_OUTPATIENT_CLINIC_OR_DEPARTMENT_OTHER): Payer: BC Managed Care – PPO

## 2013-04-07 ENCOUNTER — Ambulatory Visit (HOSPITAL_BASED_OUTPATIENT_CLINIC_OR_DEPARTMENT_OTHER): Payer: BC Managed Care – PPO

## 2013-04-13 ENCOUNTER — Ambulatory Visit (INDEPENDENT_AMBULATORY_CARE_PROVIDER_SITE_OTHER): Payer: Self-pay | Admitting: Surgery

## 2013-04-20 ENCOUNTER — Ambulatory Visit (HOSPITAL_BASED_OUTPATIENT_CLINIC_OR_DEPARTMENT_OTHER)
Admission: RE | Admit: 2013-04-20 | Discharge: 2013-04-20 | Disposition: A | Discharge status: Home / Self Care | Payer: BC Managed Care – PPO | Source: Ambulatory Visit | Attending: Family Medicine | Admitting: Family Medicine

## 2013-04-20 DIAGNOSIS — E049 Nontoxic goiter, unspecified: Secondary | ICD-10-CM | POA: Insufficient documentation

## 2013-04-26 ENCOUNTER — Encounter: Payer: Self-pay | Admitting: Vascular Surgery

## 2013-04-27 ENCOUNTER — Encounter: Payer: BC Managed Care – PPO | Admitting: Vascular Surgery

## 2013-05-10 ENCOUNTER — Ambulatory Visit (INDEPENDENT_AMBULATORY_CARE_PROVIDER_SITE_OTHER): Payer: Self-pay | Admitting: Surgery

## 2013-05-31 ENCOUNTER — Encounter (INDEPENDENT_AMBULATORY_CARE_PROVIDER_SITE_OTHER): Payer: Self-pay | Admitting: Surgery

## 2013-05-31 ENCOUNTER — Ambulatory Visit (INDEPENDENT_AMBULATORY_CARE_PROVIDER_SITE_OTHER): Payer: BC Managed Care – PPO | Admitting: Surgery

## 2013-05-31 VITALS — BP 126/84 | HR 76 | Temp 97.4°F | Resp 14 | Ht 73.0 in | Wt 319.2 lb

## 2013-05-31 DIAGNOSIS — D171 Benign lipomatous neoplasm of skin and subcutaneous tissue of trunk: Secondary | ICD-10-CM

## 2013-05-31 DIAGNOSIS — D1779 Benign lipomatous neoplasm of other sites: Secondary | ICD-10-CM

## 2013-05-31 NOTE — Patient Instructions (Signed)

## 2013-05-31 NOTE — Progress Notes (Signed)
General Surgery [facility]  Chief Complaint  Patient presents with  . New Evaluation    soft tissue mass on back - referral from Dr. Loreen Freud    HISTORY: Patient is a 27 year old female referred by her primary care physician for evaluation of a soft tissue mass in the right lower back. This was noted on routine physical examination in April 2014. Patient states it has increased slightly in size. It waxes and wanes in size. She has had some discomfort. She has had no previous such lesions removed in the past. She presents today to discuss surgical excision.  Past Medical History  Diagnosis Date  . History of chlamydia 2011  . Bronchitis     recent occurance  . Chest pain   . Cough   . Lump of skin of back     Lower Right side - Lipoma  . Varicose veins   . Goiter April 2014  . Asthma   . COPD (chronic obstructive pulmonary disease)   . Anemia      Current Outpatient Prescriptions  Medication Sig Dispense Refill  . Norgestimate-Ethinyl Estradiol Triphasic (TRI-SPRINTEC) 0.18/0.215/0.25 MG-35 MCG tablet Take 1 tablet by mouth daily.       No current facility-administered medications for this visit.     No Known Allergies   Family History  Problem Relation Age of Onset  . Cancer Mother     breast  . Diabetes Father   . Hyperlipidemia Father   . Hypertension Father   . Colon cancer Maternal Grandmother     colon  . COPD Maternal Grandfather      History   Social History  . Marital Status: Single    Spouse Name: N/A    Number of Children: N/A  . Years of Education: N/A   Occupational History  . [employer]-- Therapist, occupational    Social History Main Topics  . Smoking status: Current Some Day Smoker    Types: Cigars  . Smokeless tobacco: Never Used  . Alcohol Use: 0.0 oz/week     Comment: rare  . Drug Use: No  . Sexually Active: Yes   Other Topics Concern  . None   Social History Narrative   Exercise-- yes, walking 3x a  week     REVIEW OF SYSTEMS - PERTINENT POSITIVES ONLY: Denies any recent trauma. Denies drainage. Denies any previous such surgical procedures.  EXAM: Filed Vitals:   05/31/13 1352  BP: 126/84  Pulse: 76  Temp: 97.4 F (36.3 C)  Resp: 14    HEENT: normocephalic; pupils equal and reactive; sclerae clear; dentition good; mucous membranes moist NECK:  symmetric on extension; no palpable anterior or posterior cervical lymphadenopathy; no supraclavicular masses; no tenderness CHEST: clear to auscultation bilaterally without rales, rhonchi, or wheezes CARDIAC: regular rate and rhythm without significant murmur; peripheral pulses are full BACK:  Soft tissue mass right lower flank between the costal margin and iliac crest measuring 6 by 4 x 4 centimeters; mild tenderness; relatively firm EXT:  non-tender without edema; no deformity NEURO: no gross focal deficits; no sign of tremor   LABORATORY RESULTS: See [facility] (CHL-Epic) for most recent results   RADIOLOGY RESULTS: See [facility] (CHL-Epic) for most recent results   IMPRESSION: Soft tissue mass right flank, likely lipoma, 6 by 4 x 4 centimeters  PLAN: Patient and I discussed this finding. She is interested in having this surgically excised for definitive diagnosis and for relief of symptoms. I  think this can be performed as an outpatient surgical procedure under sedation. We will make arrangements for her procedure at a time convenient for her in the near future. We discussed the size and location of the surgical wound, the possibility of postoperative seroma, and the remote possibility of malignancy. She understands and wishes to proceed.  The risks and benefits of the procedure have been discussed at length with the patient.  The patient understands the proposed procedure, potential alternative treatments, and the course of recovery to be expected.  All of the patient's questions have been answered at this time.   The patient wishes to proceed with surgery.  Velora Heckler, MD, FACS General & Endocrine Surgery [facility]   Visit Diagnoses: 1. Lipoma of back     Primary Care Physician: Loreen Freud, DO

## 2013-06-10 ENCOUNTER — Encounter: Payer: Self-pay | Admitting: Surgery

## 2013-06-13 ENCOUNTER — Encounter: Payer: BC Managed Care – PPO | Admitting: Surgery

## 2013-06-24 ENCOUNTER — Telehealth: Payer: Self-pay | Admitting: *Deleted

## 2013-06-24 MED ORDER — NORGESTIM-ETH ESTRAD TRIPHASIC 0.18/0.215/0.25 MG-35 MCG PO TABS: 1.0000 | ORAL_TABLET | Freq: Every day | ORAL | Status: DC

## 2013-06-24 NOTE — Telephone Encounter (Signed)
Pharmacy is requesting a prescription change per patient.  In the past the patient has been receiving regular ortho-tri-cyclen in the past which was written by Dr. Jackelyn Knife  And the patient never had to pay a co-pay for the low dose birth control. However the patient has a co-pay now for $45.00 Pharmacy is requesting per patient that the prescription be changed to the regular strength ortho tri-cyclen that does not charge her a co-pay.  Last ov 03/10/13 last date filled 03/10/13 for 3 packs and 3 Refills.  Please Advise.

## 2013-06-24 NOTE — Telephone Encounter (Signed)
Patient was seen for her CPE with Pap on 4/14 and the wrong Rx was sent it, It was sent or Orthotricyclen lo instead of the regular Orthotricyclen . Rx called into the pharmacy      KP

## 2013-06-24 NOTE — Telephone Encounter (Signed)
She sees Dr. Jackelyn Knife for OB/Gyn. This should be sent back to them.

## 2013-06-30 ENCOUNTER — Telehealth: Payer: Self-pay | Admitting: Family Medicine

## 2013-06-30 NOTE — Telephone Encounter (Signed)
Patient left vm on triage line stating we sent in the wrong medication and now it is going to cost her more money. She would like rx for generic tri sprintec. She uses [address]

## 2013-07-04 NOTE — Telephone Encounter (Signed)
msg left to call the office     KP 

## 2013-07-05 MED ORDER — NORGESTIM-ETH ESTRAD TRIPHASIC 0.18/0.215/0.25 MG-35 MCG PO TABS: 1.0000 | ORAL_TABLET | Freq: Every day | ORAL | Status: DC

## 2013-07-05 NOTE — Telephone Encounter (Signed)
Discussed with patient and she was not really sure which medication will cost her 4 dollars, she said she though it was the tri-sprintec. I advised we do not have any way of telling either. The patient has been made aware that I would send in the tri-sprintec and she can check with the pharmacy or Ins company to see which medication would be cheaper and she agreed, she will call back with details or any concerns. Rx sent        KP

## 2014-04-25 ENCOUNTER — Telehealth: Payer: Self-pay | Admitting: Family Medicine

## 2014-04-25 NOTE — Telephone Encounter (Signed)
Caller name:   [place] Relation to pt: Call back [phone]   Reason for call: pt needs immunization records faxed to: Fax  [place]

## 2014-04-26 NOTE — Telephone Encounter (Signed)
Spoke with patient and I made her aware of what we had on file, she will try to get her record from the college and fax to [place] so we can have a full record to fax.     KP

## 2014-08-15 ENCOUNTER — Telehealth: Payer: Self-pay | Admitting: Family Medicine

## 2014-08-15 NOTE — Telephone Encounter (Signed)
She needs and appointment, We have not seen her in over a year.      KP

## 2014-08-15 NOTE — Telephone Encounter (Signed)
Appointment scheduled for pt. °

## 2014-08-15 NOTE — Telephone Encounter (Signed)
Caller name:[place] Caryl Pina Relation to XG:ZFPO Call back number:[phone] Pharmacy: [facility] [address]  Reason for call: pt is needing new rx for Norgestimate-Ethinyl Estradiol Triphasic 0.18/0.215/0.25 MG-35 MCG tablet, pt states she stop taking them because she was thinking of having another child however her situation has changed and she wants to go back on the bc.

## 2014-08-17 ENCOUNTER — Encounter: Payer: Self-pay | Admitting: Family Medicine

## 2014-08-17 ENCOUNTER — Ambulatory Visit (INDEPENDENT_AMBULATORY_CARE_PROVIDER_SITE_OTHER): Payer: BC Managed Care – PPO | Admitting: Family Medicine

## 2014-08-17 VITALS — BP 112/77 | HR 79 | Temp 98.3°F | Ht 72.5 in | Wt 324.3 lb

## 2014-08-17 DIAGNOSIS — Z309 Encounter for contraceptive management, unspecified: Secondary | ICD-10-CM

## 2014-08-17 MED ORDER — NORGESTIM-ETH ESTRAD TRIPHASIC 0.18/0.215/0.25 MG-35 MCG PO TABS: 1.0000 | ORAL_TABLET | Freq: Every day | ORAL | Status: DC

## 2014-08-17 NOTE — Progress Notes (Signed)
   Subjective:    Patient ID: Candice Kane, female    DOB: 1986/01/19, 28 y.o.   MRN: [id number]  HPI Pt here to discuss restarting her bcp.  No complaints.     Review of Systems As above    Objective:   Physical Exam BP 112/77  Pulse 79  Temp(Src) 98.3 F (36.8 C) (Oral)  Ht 6' 0.5" (1.842 m)  Wt 324 lb 4.8 oz (147.1 kg)  BMI 43.35 kg/m2  SpO2 98%  LMP 08/09/2014 General appearance: alert, cooperative, appears stated age and no distress Lungs: clear to auscultation bilaterally Heart: S1, S2 normal Extremities: extremities normal, atraumatic, no cyanosis or edema        Assessment & Plan:  1. Unspecified contraceptive management Restart bcp--rto cpe - Norgestimate-Ethinyl Estradiol Triphasic 0.18/0.215/0.25 MG-35 MCG tablet; Take 1 tablet by mouth daily.  Dispense: 1 Package; Refill: 11

## 2014-08-17 NOTE — Progress Notes (Signed)
Pre visit review using our clinic review tool, if applicable. No additional management support is needed unless otherwise documented below in the visit note. 

## 2014-08-17 NOTE — Patient Instructions (Signed)
Contraception Choices Contraception (birth control) is the use of any methods or devices to prevent pregnancy. Below are some methods to help avoid pregnancy. HORMONAL METHODS   Contraceptive implant. This is a thin, plastic tube containing progesterone hormone. It does not contain estrogen hormone. Your health care provider inserts the tube in the inner part of the upper arm. The tube can remain in place for up to 3 years. After 3 years, the implant must be removed. The implant prevents the ovaries from releasing an egg (ovulation), thickens the cervical mucus to prevent sperm from entering the uterus, and thins the lining of the inside of the uterus.  Progesterone-only injections. These injections are given every 3 months by your health care provider to prevent pregnancy. This synthetic progesterone hormone stops the ovaries from releasing eggs. It also thickens cervical mucus and changes the uterine lining. This makes it harder for sperm to survive in the uterus.  Birth control pills. These pills contain estrogen and progesterone hormone. They work by preventing the ovaries from releasing eggs (ovulation). They also cause the cervical mucus to thicken, preventing the sperm from entering the uterus. Birth control pills are prescribed by a health care provider.Birth control pills can also be used to treat heavy periods.  Minipill. This type of birth control pill contains only the progesterone hormone. They are taken every day of each month and must be prescribed by your health care provider.  Birth control patch. The patch contains hormones similar to those in birth control pills. It must be changed once a week and is prescribed by a health care provider.  Vaginal ring. The ring contains hormones similar to those in birth control pills. It is left in the vagina for 3 weeks, removed for 1 week, and then a new one is put back in place. The patient must be comfortable inserting and removing the ring  from the vagina.A health care provider's prescription is necessary.  Emergency contraception. Emergency contraceptives prevent pregnancy after unprotected sexual intercourse. This pill can be taken right after sex or up to 5 days after unprotected sex. It is most effective the sooner you take the pills after having sexual intercourse. Most emergency contraceptive pills are available without a prescription. Check with your pharmacist. Do not use emergency contraception as your only form of birth control. BARRIER METHODS   Female condom. This is a thin sheath (latex or rubber) that is worn over the penis during sexual intercourse. It can be used with spermicide to increase effectiveness.  Female condom. This is a soft, loose-fitting sheath that is put into the vagina before sexual intercourse.  Diaphragm. This is a soft, latex, dome-shaped barrier that must be fitted by a health care provider. It is inserted into the vagina, along with a spermicidal jelly. It is inserted before intercourse. The diaphragm should be left in the vagina for 6 to 8 hours after intercourse.  Cervical cap. This is a round, soft, latex or plastic cup that fits over the cervix and must be fitted by a health care provider. The cap can be left in place for up to 48 hours after intercourse.  Sponge. This is a soft, circular piece of polyurethane foam. The sponge has spermicide in it. It is inserted into the vagina after wetting it and before sexual intercourse.  Spermicides. These are chemicals that kill or block sperm from entering the cervix and uterus. They come in the form of creams, jellies, suppositories, foam, or tablets. They do not require a   prescription. They are inserted into the vagina with an applicator before having sexual intercourse. The process must be repeated every time you have sexual intercourse. INTRAUTERINE CONTRACEPTION  Intrauterine device (IUD). This is a T-shaped device that is put in a woman's uterus  during a menstrual period to prevent pregnancy. There are 2 types:  Copper IUD. This type of IUD is wrapped in copper wire and is placed inside the uterus. Copper makes the uterus and fallopian tubes produce a fluid that kills sperm. It can stay in place for 10 years.  Hormone IUD. This type of IUD contains the hormone progestin (synthetic progesterone). The hormone thickens the cervical mucus and prevents sperm from entering the uterus, and it also thins the uterine lining to prevent implantation of a fertilized egg. The hormone can weaken or kill the sperm that get into the uterus. It can stay in place for 3-5 years, depending on which type of IUD is used. PERMANENT METHODS OF CONTRACEPTION  Female tubal ligation. This is when the woman's fallopian tubes are surgically sealed, tied, or blocked to prevent the egg from traveling to the uterus.  Hysteroscopic sterilization. This involves placing a small coil or insert into each fallopian tube. Your doctor uses a technique called hysteroscopy to do the procedure. The device causes scar tissue to form. This results in permanent blockage of the fallopian tubes, so the sperm cannot fertilize the egg. It takes about 3 months after the procedure for the tubes to become blocked. You must use another form of birth control for these 3 months.  Female sterilization. This is when the female has the tubes that carry sperm tied off (vasectomy).This blocks sperm from entering the vagina during sexual intercourse. After the procedure, the man can still ejaculate fluid (semen). NATURAL PLANNING METHODS  Natural family planning. This is not having sexual intercourse or using a barrier method (condom, diaphragm, cervical cap) on days the woman could become pregnant.  Calendar method. This is keeping track of the length of each menstrual cycle and identifying when you are fertile.  Ovulation method. This is avoiding sexual intercourse during ovulation.  Symptothermal  method. This is avoiding sexual intercourse during ovulation, using a thermometer and ovulation symptoms.  Post-ovulation method. This is timing sexual intercourse after you have ovulated. Regardless of which type or method of contraception you choose, it is important that you use condoms to protect against the transmission of sexually transmitted infections (STIs). Talk with your health care provider about which form of contraception is most appropriate for you. Document Released: 11/17/2005 Document Revised: 11/22/2013 Document Reviewed: 05/12/2013 ExitCare Patient Information 2015 [employer]. This information is not intended to replace advice given to you by your health care provider. Make sure you discuss any questions you have with your health care provider.  

## 2014-10-02 ENCOUNTER — Encounter: Payer: Self-pay | Admitting: Family Medicine

## 2015-11-08 ENCOUNTER — Encounter (HOSPITAL_BASED_OUTPATIENT_CLINIC_OR_DEPARTMENT_OTHER): Payer: Self-pay | Admitting: Emergency Medicine

## 2015-11-08 ENCOUNTER — Emergency Department (HOSPITAL_BASED_OUTPATIENT_CLINIC_OR_DEPARTMENT_OTHER)
Admission: EM | Admit: 2015-11-08 | Discharge: 2015-11-08 | Disposition: A | Discharge status: Home / Self Care | Payer: BC Managed Care – PPO | Attending: Emergency Medicine | Admitting: Emergency Medicine

## 2015-11-08 DIAGNOSIS — Z793 Long term (current) use of hormonal contraceptives: Secondary | ICD-10-CM | POA: Insufficient documentation

## 2015-11-08 DIAGNOSIS — Z8639 Personal history of other endocrine, nutritional and metabolic disease: Secondary | ICD-10-CM | POA: Insufficient documentation

## 2015-11-08 DIAGNOSIS — R21 Rash and other nonspecific skin eruption: Secondary | ICD-10-CM

## 2015-11-08 DIAGNOSIS — J449 Chronic obstructive pulmonary disease, unspecified: Secondary | ICD-10-CM | POA: Diagnosis not present

## 2015-11-08 DIAGNOSIS — Z87891 Personal history of nicotine dependence: Secondary | ICD-10-CM | POA: Insufficient documentation

## 2015-11-08 DIAGNOSIS — S50862A Insect bite (nonvenomous) of left forearm, initial encounter: Secondary | ICD-10-CM | POA: Insufficient documentation

## 2015-11-08 DIAGNOSIS — Z862 Personal history of diseases of the blood and blood-forming organs and certain disorders involving the immune mechanism: Secondary | ICD-10-CM | POA: Diagnosis not present

## 2015-11-08 DIAGNOSIS — Y998 Other external cause status: Secondary | ICD-10-CM | POA: Insufficient documentation

## 2015-11-08 DIAGNOSIS — S50861A Insect bite (nonvenomous) of right forearm, initial encounter: Secondary | ICD-10-CM | POA: Insufficient documentation

## 2015-11-08 DIAGNOSIS — S40862A Insect bite (nonvenomous) of left upper arm, initial encounter: Secondary | ICD-10-CM | POA: Diagnosis not present

## 2015-11-08 DIAGNOSIS — W57XXXA Bitten or stung by nonvenomous insect and other nonvenomous arthropods, initial encounter: Secondary | ICD-10-CM

## 2015-11-08 DIAGNOSIS — Z8619 Personal history of other infectious and parasitic diseases: Secondary | ICD-10-CM | POA: Diagnosis not present

## 2015-11-08 DIAGNOSIS — Z8679 Personal history of other diseases of the circulatory system: Secondary | ICD-10-CM | POA: Diagnosis not present

## 2015-11-08 DIAGNOSIS — Y9289 Other specified places as the place of occurrence of the external cause: Secondary | ICD-10-CM | POA: Diagnosis not present

## 2015-11-08 DIAGNOSIS — Y9389 Activity, other specified: Secondary | ICD-10-CM | POA: Diagnosis not present

## 2015-11-08 MED ORDER — HYDROCORTISONE 1 % EX CREA: TOPICAL_CREAM | CUTANEOUS | Status: DC

## 2015-11-08 NOTE — Discharge Instructions (Signed)
Apply cortisone cream to affected area several times per day. May take Benadryl for itch. Follow up with her primary care provider if symptoms are not improved. Return to the emergency department if symptoms worsen or you experience spreading of the redness and mild lip or tongue swelling, fever.

## 2015-11-08 NOTE — ED Notes (Signed)
Patient states that she has "bites" all over her upper arms and wrist. Patient states that they are itching

## 2015-11-11 NOTE — ED Provider Notes (Signed)
CSN: [id number]     Arrival date & time 11/08/15  1744 History   First MD Initiated Contact with Patient 11/08/15 1838     Chief Complaint  Patient presents with  . Rash     (Consider location/radiation/quality/duration/timing/severity/associated sxs/prior Treatment) HPI   Candice Kane is a 29 y.o F with no significant pmhx who presents for a rash. Pt states that she stayed the night with a friend 2 night ago who has a dog and when she woke up she had "bug bites" on her left upper arm, left forearm and R forearm. Pt states that they are very itchy and she has been scratching them constantly. The areas are now swollen and red. Pt has not tried anything at home for itch relief. No fever or chills. No new laundry detergents, lotions, perfumes, etc.   Past Medical History  Diagnosis Date  . History of chlamydia 2011  . Bronchitis     recent occurance  . Chest pain   . Cough   . Lump of skin of back     Lower Right side - Lipoma  . Varicose veins   . Goiter April 2014  . Asthma   . COPD (chronic obstructive pulmonary disease) ([place])   . Anemia    Past Surgical History  Procedure Laterality Date  . Tonsilectomy, adenoidectomy, bilateral myringotomy and tubes     Family History  Problem Relation Age of Onset  . Cancer Mother     breast  . Diabetes Father   . Hyperlipidemia Father   . Hypertension Father   . Colon cancer Maternal Grandmother     colon  . COPD Maternal Grandfather    Social History  Substance Use Topics  . Smoking status: Former Smoker    Types: Cigars  . Smokeless tobacco: Never Used  . Alcohol Use: 0.0 oz/week     Comment: rare   OB History    Gravida Para Term Preterm AB TAB SAB Ectopic Multiple Living   1 1 1             Review of Systems  Skin: Positive for rash.  All other systems reviewed and are negative.     Allergies  Review of patient's allergies indicates no known allergies.  Home Medications   Prior to Admission medications     Medication Sig Start Date End Date Taking? Authorizing Provider  hydrocortisone cream 1 % Apply to affected area 2 times daily 11/08/15   Alazay Leicht Tripp Jaemarie Hochberg, PA-C  Norgestimate-Ethinyl Estradiol Triphasic 0.18/0.215/0.25 MG-35 MCG tablet Take 1 tablet by mouth daily. 08/17/14   Yvonne R Lowne, DO   BP 144/88 mmHg  Pulse 82  Temp(Src) 98.1 F (36.7 C) (Oral)  Resp 18  SpO2 100%  LMP 11/03/2015 Physical Exam  Constitutional: She is oriented to person, place, and time. She appears well-developed and well-nourished. No distress.  HENT:  Head: Normocephalic and atraumatic.  Mouth/Throat: Oropharynx is clear and moist. No oropharyngeal exudate.  Eyes: Conjunctivae are normal. Right eye exhibits no discharge. Left eye exhibits no discharge. No scleral icterus.  Neck: Neck supple.  Cardiovascular: Normal rate.   Pulmonary/Chest: Effort normal. No stridor. No respiratory distress.  Neurological: She is alert and oriented to person, place, and time. Coordination normal.  Skin: Skin is warm and dry. Rash noted. No ecchymosis, no petechiae and no purpura noted. Rash is not pustular and not vesicular. She is not diaphoretic. There is erythema. No pallor.     Psychiatric: She has a  normal mood and affect. Her behavior is normal.  Nursing note and vitals reviewed.   ED Course  Procedures (including critical care time) Labs Review Labs Reviewed - No data to display  Imaging Review No results found. I have personally reviewed and evaluated these images and lab results as part of my medical decision-making.   EKG Interpretation None      MDM   Final diagnoses:  Rash  Bug bite    Pt with bug bites to R and L forearm and R upper arm. Potential flea bites as she was around a dog with known fleas. Affected areas are pruritic and erythematous. No warmth. No concern for superimposed infection. No difficulty breathing. No airway compromise, tongue or lip swelling. Throat normal. Will d/c  home with hydrocortisone cream to apply several times per day. Pt may take benadryl for itch relief and antihistamine benefits. Pt will follow up with PCP if symptoms do not improve. Return precautions outlined in patient discharge instructions.      Carlos Levering, PA-C 11/11/15 [place], MD 11/21/15 [phone]

## 2016-01-09 ENCOUNTER — Encounter: Payer: Self-pay | Admitting: Behavioral Health

## 2016-01-09 ENCOUNTER — Telehealth: Payer: Self-pay | Admitting: Behavioral Health

## 2016-01-09 NOTE — Telephone Encounter (Signed)
Pre-Visit Call completed with patient and chart updated.   Pre-Visit Info documented in Specialty Comments under [employer].    

## 2016-01-10 ENCOUNTER — Ambulatory Visit (INDEPENDENT_AMBULATORY_CARE_PROVIDER_SITE_OTHER): Payer: BC Managed Care – PPO | Admitting: Family Medicine

## 2016-01-10 ENCOUNTER — Other Ambulatory Visit (HOSPITAL_COMMUNITY)
Admission: RE | Admit: 2016-01-10 | Discharge: 2016-01-10 | Disposition: A | Discharge status: Home / Self Care | Payer: BC Managed Care – PPO | Source: Ambulatory Visit | Attending: Family Medicine | Admitting: Family Medicine

## 2016-01-10 ENCOUNTER — Encounter: Payer: Self-pay | Admitting: Family Medicine

## 2016-01-10 VITALS — BP 120/86 | Temp 98.1°F | Resp 18 | Ht 73.0 in | Wt 314.4 lb

## 2016-01-10 DIAGNOSIS — Z0000 Encounter for general adult medical examination without abnormal findings: Secondary | ICD-10-CM

## 2016-01-10 DIAGNOSIS — I83009 Varicose veins of unspecified lower extremity with ulcer of unspecified site: Secondary | ICD-10-CM

## 2016-01-10 DIAGNOSIS — J069 Acute upper respiratory infection, unspecified: Secondary | ICD-10-CM

## 2016-01-10 DIAGNOSIS — Z124 Encounter for screening for malignant neoplasm of cervix: Secondary | ICD-10-CM

## 2016-01-10 DIAGNOSIS — Z1151 Encounter for screening for human papillomavirus (HPV): Secondary | ICD-10-CM | POA: Insufficient documentation

## 2016-01-10 DIAGNOSIS — Z01419 Encounter for gynecological examination (general) (routine) without abnormal findings: Secondary | ICD-10-CM | POA: Insufficient documentation

## 2016-01-10 DIAGNOSIS — L97909 Non-pressure chronic ulcer of unspecified part of unspecified lower leg with unspecified severity: Secondary | ICD-10-CM

## 2016-01-10 DIAGNOSIS — D179 Benign lipomatous neoplasm, unspecified: Secondary | ICD-10-CM | POA: Diagnosis not present

## 2016-01-10 MED ORDER — FLUTICASONE PROPIONATE 50 MCG/ACT NA SUSP: 2.0000 | Freq: Every day | NASAL | Status: DC

## 2016-01-10 NOTE — Progress Notes (Signed)
Pre visit review using our clinic review tool, if applicable. No additional management support is needed unless otherwise documented below in the visit note. 

## 2016-01-10 NOTE — Progress Notes (Signed)
Subjective:     Candice Kane is a 30 y.o. female and is here for a comprehensive physical exam. The patient reports problems - uri only.  Social History   Social History  . Marital Status: Single    Spouse Name: N/A  . Number of Children: N/A  . Years of Education: N/A   Occupational History  . [employer]-- Architectural technologist    Social History Main Topics  . Smoking status: Former Smoker    Types: Cigars  . Smokeless tobacco: Never Used  . Alcohol Use: 0.0 oz/week     Comment: rare  . Drug Use: No  . Sexual Activity: Yes   Other Topics Concern  . Not on file   Social History Narrative   Exercise-- yes, walking 3x a week   Health Maintenance  Topic Date Due  . INFLUENZA VACCINE  01/09/2017 (Originally 07/02/2015)  . PAP SMEAR  03/10/2016  . TETANUS/TDAP  08/19/2021  . HIV Screening  Completed    The following portions of the patient's history were reviewed and updated as appropriate:  She  has a past medical history of History of chlamydia (2011); Bronchitis; Chest pain; Cough; Lump of skin of back; Varicose veins; Goiter (April 2014); Asthma; COPD (chronic obstructive pulmonary disease) ([place]); and Anemia. She  does not have any pertinent problems on file. She  has past surgical history that includes Tonsilectomy, adenoidectomy, bilateral myringotomy and tubes. Her family history includes COPD in her maternal grandfather; Cancer in her mother; Colon cancer in her maternal grandmother; Diabetes in her father; Hyperlipidemia in her father; Hypertension in her father. She  reports that she has quit smoking. Her smoking use included Cigars. She has never used smokeless tobacco. She reports that she drinks alcohol. She reports that she does not use illicit drugs. She has a current medication list which includes the following prescription(s): multivitamin with minerals, pseudoephedrine-apap-dm, pseudoephedrine-ibuprofen, and fluticasone. Current Outpatient Prescriptions on File  Prior to Visit  Medication Sig Dispense Refill  . Multiple Vitamins-Minerals (MULTIVITAMIN WITH MINERALS) tablet Take 1 tablet by mouth daily.     No current facility-administered medications on file prior to visit.   She has No Known Allergies..  Review of Systems Review of Systems  Constitutional: Negative for activity change, appetite change and fatigue.  HENT: Negative for hearing loss, congestion, tinnitus and ear discharge.  dentist [id number]: Negative for visual disturbance (see optho q1y -- vision corrected to 20/20 with glasses).  Respiratory: Negative for cough, chest tightness and shortness of breath.   Cardiovascular: Negative for chest pain, palpitations and leg swelling.  Gastrointestinal: Negative for abdominal pain, diarrhea, constipation and abdominal distention.  Genitourinary: Negative for urgency, frequency, decreased urine volume and difficulty urinating.  Musculoskeletal: Negative for back pain, arthralgias and gait problem.  Skin: Negative for color change, pallor and rash.  Neurological: Negative for dizziness, light-headedness, numbness and headaches.  Hematological: Negative for adenopathy. Does not bruise/bleed easily.  Psychiatric/Behavioral: Negative for suicidal ideas, confusion, sleep disturbance, self-injury, dysphoric mood, decreased concentration and agitation.       Objective:    BP 120/86 mmHg  Temp(Src) 98.1 F (36.7 C) (Oral)  Resp 18  Ht _0  (1.854 m)  Wt 314 lb 6.4 oz (142.611 kg)  BMI 41.49 kg/m2  LMP 12/26/2015 General appearance: alert, cooperative, appears stated age and no distress Head: Normocephalic, without obvious abnormality, atraumatic Eyes: conjunctivae/corneas clear. PERRL, EOM's intact. Fundi benign. Ears: normal TM's and external ear canals both ears Nose: clear  discharge, mild congestion, turbinates red, swollen, no sinus tenderness Throat: lips, mucosa, and tongue normal; teeth and gums normal Neck: moderate anterior  cervical adenopathy, supple, symmetrical, trachea midline and thyroid not enlarged, symmetric, no tenderness/mass/nodules Back: symmetric, no curvature. ROM normal. No CVA tenderness. Lungs: clear to auscultation bilaterally Breasts: normal appearance, no masses or tenderness Heart: regular rate and rhythm, S1, S2 normal, no murmur, click, rub or gallop Abdomen: soft, non-tender; bowel sounds normal; no masses,  no organomegaly Pelvic: cervix normal in appearance, external genitalia normal, no adnexal masses or tenderness, no cervical motion tenderness, rectovaginal septum normal, uterus normal size, shape, and consistency, vagina normal without discharge and pap done Extremities: extremities normal, atraumatic, no cyanosis or edema Pulses: 2+ and symmetric Skin: Skin color, texture, turgor normal. No rashes or lesions Lymph nodes: Cervical, supraclavicular, and axillary nodes normal. Neurologic: Alert and oriented X 3, normal strength and tone. Normal symmetric reflexes. Normal coordination and gait Psych- no depression      Assessment:    Healthy female exam.      Plan:    ghm utd  Check labs See After Visit Summary for Counseling Recommendations    1. Acute upper respiratory infection  - fluticasone (FLONASE) 50 MCG/ACT nasal spray; Place 2 sprays into both nostrils daily.  Dispense: 16 g; Refill: 6  2. Preventative health care  - Comp Met (CMET) - Lipid panel - TSH - POCT urinalysis dipstick - CBC with Differential/Platelet  3. Varicose veins of lower extremities with ulcer, unspecified laterality ([place])  - Ambulatory referral to Vascular Surgery  4. Lipoma  - Ambulatory referral to General Surgery  5. Cervical cancer screening  - Cytology - PAP

## 2016-01-10 NOTE — Patient Instructions (Signed)
Preventive Care for Adults, Female A healthy lifestyle and preventive care can promote health and wellness. Preventive health guidelines for women include the following key practices.  A routine yearly physical is a good way to check with your health care provider about your health and preventive screening. It is a chance to share any concerns and updates on your health and to receive a thorough exam.  Visit your dentist for a routine exam and preventive care every 6 months. Brush your teeth twice a day and floss once a day. Good oral hygiene prevents tooth decay and gum disease.  The frequency of eye exams is based on your age, health, family medical history, use of contact lenses, and other factors. Follow your health care provider's recommendations for frequency of eye exams.  Eat a healthy diet. Foods like vegetables, fruits, whole grains, low-fat dairy products, and lean protein foods contain the nutrients you need without too many calories. Decrease your intake of foods high in solid fats, added sugars, and salt. Eat the right amount of calories for you.Get information about a proper diet from your health care provider, if necessary.  Regular physical exercise is one of the most important things you can do for your health. Most adults should get at least 150 minutes of moderate-intensity exercise (any activity that increases your heart rate and causes you to sweat) each week. In addition, most adults need muscle-strengthening exercises on 2 or more days a week.  Maintain a healthy weight. The body mass index (BMI) is a screening tool to identify possible weight problems. It provides an estimate of body fat based on height and weight. Your health care provider can find your BMI and can help you achieve or maintain a healthy weight.For adults 20 years and older:  A BMI below 18.5 is considered underweight.  A BMI of 18.5 to 24.9 is normal.  A BMI of 25 to 29.9 is considered overweight.  A  BMI of 30 and above is considered obese.  Maintain normal blood lipids and cholesterol levels by exercising and minimizing your intake of saturated fat. Eat a balanced diet with plenty of fruit and vegetables. Blood tests for lipids and cholesterol should begin at age 45 and be repeated every 5 years. If your lipid or cholesterol levels are high, you are over 50, or you are at high risk for heart disease, you may need your cholesterol levels checked more frequently.Ongoing high lipid and cholesterol levels should be treated with medicines if diet and exercise are not working.  If you smoke, find out from your health care provider how to quit. If you do not use tobacco, do not start.  Lung cancer screening is recommended for adults aged 45-80 years who are at high risk for developing lung cancer because of a history of smoking. A yearly low-dose CT scan of the lungs is recommended for people who have at least a 30-pack-year history of smoking and are a current smoker or have quit within the past 15 years. A pack year of smoking is smoking an average of 1 pack of cigarettes a day for 1 year (for example: 1 pack a day for 30 years or 2 packs a day for 15 years). Yearly screening should continue until the smoker has stopped smoking for at least 15 years. Yearly screening should be stopped for people who develop a health problem that would prevent them from having lung cancer treatment.  If you are pregnant, do not drink alcohol. If you are  breastfeeding, be very cautious about drinking alcohol. If you are not pregnant and choose to drink alcohol, do not have more than 1 drink per day. One drink is considered to be 12 ounces (355 mL) of beer, 5 ounces (148 mL) of wine, or 1.5 ounces (44 mL) of liquor.  Avoid use of street drugs. Do not share needles with anyone. Ask for help if you need support or instructions about stopping the use of drugs.  High blood pressure causes heart disease and increases the risk  of stroke. Your blood pressure should be checked at least every 1 to 2 years. Ongoing high blood pressure should be treated with medicines if weight loss and exercise do not work.  If you are 55-79 years old, ask your health care provider if you should take aspirin to prevent strokes.  Diabetes screening is done by taking a blood sample to check your blood glucose level after you have not eaten for a certain period of time (fasting). If you are not overweight and you do not have risk factors for diabetes, you should be screened once every 3 years starting at age 45. If you are overweight or obese and you are 40-70 years of age, you should be screened for diabetes every year as part of your cardiovascular risk assessment.  Breast cancer screening is essential preventive care for women. You should practice "breast self-awareness." This means understanding the normal appearance and feel of your breasts and may include breast self-examination. Any changes detected, no matter how small, should be reported to a health care provider. Women in their 20s and 30s should have a clinical breast exam (CBE) by a health care provider as part of a regular health exam every 1 to 3 years. After age 40, women should have a CBE every year. Starting at age 40, women should consider having a mammogram (breast X-ray test) every year. Women who have a family history of breast cancer should talk to their health care provider about genetic screening. Women at a high risk of breast cancer should talk to their health care providers about having an MRI and a mammogram every year.  Breast cancer gene (BRCA)-related cancer risk assessment is recommended for women who have family members with BRCA-related cancers. BRCA-related cancers include breast, ovarian, tubal, and peritoneal cancers. Having family members with these cancers may be associated with an increased risk for harmful changes (mutations) in the breast cancer genes BRCA1 and  BRCA2. Results of the assessment will determine the need for genetic counseling and BRCA1 and BRCA2 testing.  Your health care provider may recommend that you be screened regularly for cancer of the pelvic organs (ovaries, uterus, and vagina). This screening involves a pelvic examination, including checking for microscopic changes to the surface of your cervix (Pap test). You may be encouraged to have this screening done every 3 years, beginning at age 21.  For women ages 30-65, health care providers may recommend pelvic exams and Pap testing every 3 years, or they may recommend the Pap and pelvic exam, combined with testing for human papilloma virus (HPV), every 5 years. Some types of HPV increase your risk of cervical cancer. Testing for HPV may also be done on women of any age with unclear Pap test results.  Other health care providers may not recommend any screening for nonpregnant women who are considered low risk for pelvic cancer and who do not have symptoms. Ask your health care provider if a screening pelvic exam is right for   you.  If you have had past treatment for cervical cancer or a condition that could lead to cancer, you need Pap tests and screening for cancer for at least 20 years after your treatment. If Pap tests have been discontinued, your risk factors (such as having a new sexual partner) need to be reassessed to determine if screening should resume. Some women have medical problems that increase the chance of getting cervical cancer. In these cases, your health care provider may recommend more frequent screening and Pap tests.  Colorectal cancer can be detected and often prevented. Most routine colorectal cancer screening begins at the age of 50 years and continues through age 75 years. However, your health care provider may recommend screening at an earlier age if you have risk factors for colon cancer. On a yearly basis, your health care provider may provide home test kits to check  for hidden blood in the stool. Use of a small camera at the end of a tube, to directly examine the colon (sigmoidoscopy or colonoscopy), can detect the earliest forms of colorectal cancer. Talk to your health care provider about this at age 50, when routine screening begins. Direct exam of the colon should be repeated every 5-10 years through age 75 years, unless early forms of precancerous polyps or small growths are found.  People who are at an increased risk for hepatitis B should be screened for this virus. You are considered at high risk for hepatitis B if:  You were born in a country where hepatitis B occurs often. Talk with your health care provider about which countries are considered high risk.  Your parents were born in a high-risk country and you have not received a shot to protect against hepatitis B (hepatitis B vaccine).  You have HIV or AIDS.  You use needles to inject street drugs.  You live with, or have sex with, someone who has hepatitis B.  You get hemodialysis treatment.  You take certain medicines for conditions like cancer, organ transplantation, and autoimmune conditions.  Hepatitis C blood testing is recommended for all people born from 1945 through 1965 and any individual with known risks for hepatitis C.  Practice safe sex. Use condoms and avoid high-risk sexual practices to reduce the spread of sexually transmitted infections (STIs). STIs include gonorrhea, chlamydia, syphilis, trichomonas, herpes, HPV, and human immunodeficiency virus (HIV). Herpes, HIV, and HPV are viral illnesses that have no cure. They can result in disability, cancer, and death.  You should be screened for sexually transmitted illnesses (STIs) including gonorrhea and chlamydia if:  You are sexually active and are younger than 24 years.  You are older than 24 years and your health care provider tells you that you are at risk for this type of infection.  Your sexual activity has changed  since you were last screened and you are at an increased risk for chlamydia or gonorrhea. Ask your health care provider if you are at risk.  If you are at risk of being infected with HIV, it is recommended that you take a prescription medicine daily to prevent HIV infection. This is called preexposure prophylaxis (PrEP). You are considered at risk if:  You are sexually active and do not regularly use condoms or know the HIV status of your partner(s).  You take drugs by injection.  You are sexually active with a partner who has HIV.  Talk with your health care provider about whether you are at high risk of being infected with HIV. If   you choose to begin PrEP, you should first be tested for HIV. You should then be tested every 3 months for as long as you are taking PrEP.  Osteoporosis is a disease in which the bones lose minerals and strength with aging. This can result in serious bone fractures or breaks. The risk of osteoporosis can be identified using a bone density scan. Women ages 67 years and over and women at risk for fractures or osteoporosis should discuss screening with their health care providers. Ask your health care provider whether you should take a calcium supplement or vitamin D to reduce the rate of osteoporosis.  Menopause can be associated with physical symptoms and risks. Hormone replacement therapy is available to decrease symptoms and risks. You should talk to your health care provider about whether hormone replacement therapy is right for you.  Use sunscreen. Apply sunscreen liberally and repeatedly throughout the day. You should seek shade when your shadow is shorter than you. Protect yourself by wearing long sleeves, pants, a wide-brimmed hat, and sunglasses year round, whenever you are outdoors.  Once a month, do a whole body skin exam, using a mirror to look at the skin on your back. Tell your health care provider of new moles, moles that have irregular borders, moles that  are larger than a pencil eraser, or moles that have changed in shape or color.  Stay current with required vaccines (immunizations).  Influenza vaccine. All adults should be immunized every year.  Tetanus, diphtheria, and acellular pertussis (Td, Tdap) vaccine. Pregnant women should receive 1 dose of Tdap vaccine during each pregnancy. The dose should be obtained regardless of the length of time since the last dose. Immunization is preferred during the 27th-36th week of gestation. An adult who has not previously received Tdap or who does not know her vaccine status should receive 1 dose of Tdap. This initial dose should be followed by tetanus and diphtheria toxoids (Td) booster doses every 10 years. Adults with an unknown or incomplete history of completing a 3-dose immunization series with Td-containing vaccines should begin or complete a primary immunization series including a Tdap dose. Adults should receive a Td booster every 10 years.  Varicella vaccine. An adult without evidence of immunity to varicella should receive 2 doses or a second dose if she has previously received 1 dose. Pregnant females who do not have evidence of immunity should receive the first dose after pregnancy. This first dose should be obtained before leaving the health care facility. The second dose should be obtained 4-8 weeks after the first dose.  Human papillomavirus (HPV) vaccine. Females aged 13-26 years who have not received the vaccine previously should obtain the 3-dose series. The vaccine is not recommended for use in pregnant females. However, pregnancy testing is not needed before receiving a dose. If a female is found to be pregnant after receiving a dose, no treatment is needed. In that case, the remaining doses should be delayed until after the pregnancy. Immunization is recommended for any person with an immunocompromised condition through the age of 61 years if she did not get any or all doses earlier. During the  3-dose series, the second dose should be obtained 4-8 weeks after the first dose. The third dose should be obtained 24 weeks after the first dose and 16 weeks after the second dose.  Zoster vaccine. One dose is recommended for adults aged 30 years or older unless certain conditions are present.  Measles, mumps, and rubella (MMR) vaccine. Adults born  before 1957 generally are considered immune to measles and mumps. Adults born in 1957 or later should have 1 or more doses of MMR vaccine unless there is a contraindication to the vaccine or there is laboratory evidence of immunity to each of the three diseases. A routine second dose of MMR vaccine should be obtained at least 28 days after the first dose for students attending postsecondary schools, health care workers, or international travelers. People who received inactivated measles vaccine or an unknown type of measles vaccine during 1963-1967 should receive 2 doses of MMR vaccine. People who received inactivated mumps vaccine or an unknown type of mumps vaccine before 1979 and are at high risk for mumps infection should consider immunization with 2 doses of MMR vaccine. For females of childbearing age, rubella immunity should be determined. If there is no evidence of immunity, females who are not pregnant should be vaccinated. If there is no evidence of immunity, females who are pregnant should delay immunization until after pregnancy. Unvaccinated health care workers born before 1957 who lack laboratory evidence of measles, mumps, or rubella immunity or laboratory confirmation of disease should consider measles and mumps immunization with 2 doses of MMR vaccine or rubella immunization with 1 dose of MMR vaccine.  Pneumococcal 13-valent conjugate (PCV13) vaccine. When indicated, a person who is uncertain of his immunization history and has no record of immunization should receive the PCV13 vaccine. All adults 65 years of age and older should receive this  vaccine. An adult aged 19 years or older who has certain medical conditions and has not been previously immunized should receive 1 dose of PCV13 vaccine. This PCV13 should be followed with a dose of pneumococcal polysaccharide (PPSV23) vaccine. Adults who are at high risk for pneumococcal disease should obtain the PPSV23 vaccine at least 8 weeks after the dose of PCV13 vaccine. Adults older than 30 years of age who have normal immune system function should obtain the PPSV23 vaccine dose at least 1 year after the dose of PCV13 vaccine.  Pneumococcal polysaccharide (PPSV23) vaccine. When PCV13 is also indicated, PCV13 should be obtained first. All adults aged 65 years and older should be immunized. An adult younger than age 65 years who has certain medical conditions should be immunized. Any person who resides in a nursing home or long-term care facility should be immunized. An adult smoker should be immunized. People with an immunocompromised condition and certain other conditions should receive both PCV13 and PPSV23 vaccines. People with human immunodeficiency virus (HIV) infection should be immunized as soon as possible after diagnosis. Immunization during chemotherapy or radiation therapy should be avoided. Routine use of PPSV23 vaccine is not recommended for American Indians, [place] Natives, or people younger than 65 years unless there are medical conditions that require PPSV23 vaccine. When indicated, people who have unknown immunization and have no record of immunization should receive PPSV23 vaccine. One-time revaccination 5 years after the first dose of PPSV23 is recommended for people aged 19-64 years who have chronic kidney failure, nephrotic syndrome, asplenia, or immunocompromised conditions. People who received 1-2 doses of PPSV23 before age 65 years should receive another dose of PPSV23 vaccine at age 65 years or later if at least 5 years have passed since the previous dose. Doses of PPSV23 are not  needed for people immunized with PPSV23 at or after age 65 years.  Meningococcal vaccine. Adults with asplenia or persistent complement component deficiencies should receive 2 doses of quadrivalent meningococcal conjugate (MenACWY-D) vaccine. The doses should be obtained   at least 2 months apart. Microbiologists working with certain meningococcal bacteria, [place] recruits, people at risk during an outbreak, and people who travel to or live in countries with a high rate of meningitis should be immunized. A first-year college student up through age 34 years who is living in a residence hall should receive a dose if she did not receive a dose on or after her 16th birthday. Adults who have certain high-risk conditions should receive one or more doses of vaccine.  Hepatitis A vaccine. Adults who wish to be protected from this disease, have certain high-risk conditions, work with hepatitis A-infected animals, work in hepatitis A research labs, or travel to or work in countries with a high rate of hepatitis A should be immunized. Adults who were previously unvaccinated and who anticipate close contact with an international adoptee during the first 60 days after arrival in the [place] from a country with a high rate of hepatitis A should be immunized.  Hepatitis B vaccine. Adults who wish to be protected from this disease, have certain high-risk conditions, may be exposed to blood or other infectious body fluids, are household contacts or sex partners of hepatitis B positive people, are clients or workers in certain care facilities, or travel to or work in countries with a high rate of hepatitis B should be immunized.  Haemophilus influenzae type b (Hib) vaccine. A previously unvaccinated person with asplenia or sickle cell disease or having a scheduled splenectomy should receive 1 dose of Hib vaccine. Regardless of previous immunization, a recipient of a hematopoietic stem cell transplant should receive a  3-dose series 6-12 months after her successful transplant. Hib vaccine is not recommended for adults with HIV infection. Preventive Services / Frequency Ages 35 to 4 years  Blood pressure check.** / Every 3-5 years.  Lipid and cholesterol check.** / Every 5 years beginning at age 60.  Clinical breast exam.** / Every 3 years for women in their 71s and 10s.  BRCA-related cancer risk assessment.** / For women who have family members with a BRCA-related cancer (breast, ovarian, tubal, or peritoneal cancers).  Pap test.** / Every 2 years from ages 76 through 26. Every 3 years starting at age 61 through age 76 or 93 with a history of 3 consecutive normal Pap tests.  HPV screening.** / Every 3 years from ages 37 through ages 60 to 51 with a history of 3 consecutive normal Pap tests.  Hepatitis C blood test.** / For any individual with known risks for hepatitis C.  Skin self-exam. / Monthly.  Influenza vaccine. / Every year.  Tetanus, diphtheria, and acellular pertussis (Tdap, Td) vaccine.** / Consult your health care provider. Pregnant women should receive 1 dose of Tdap vaccine during each pregnancy. 1 dose of Td every 10 years.  Varicella vaccine.** / Consult your health care provider. Pregnant females who do not have evidence of immunity should receive the first dose after pregnancy.  HPV vaccine. / 3 doses over 6 months, if 93 and younger. The vaccine is not recommended for use in pregnant females. However, pregnancy testing is not needed before receiving a dose.  Measles, mumps, rubella (MMR) vaccine.** / You need at least 1 dose of MMR if you were born in 1957 or later. You may also need a 2nd dose. For females of childbearing age, rubella immunity should be determined. If there is no evidence of immunity, females who are not pregnant should be vaccinated. If there is no evidence of immunity, females who are  pregnant should delay immunization until after pregnancy.  Pneumococcal  13-valent conjugate (PCV13) vaccine.** / Consult your health care provider.  Pneumococcal polysaccharide (PPSV23) vaccine.** / 1 to 2 doses if you smoke cigarettes or if you have certain conditions.  Meningococcal vaccine.** / 1 dose if you are age 68 to 8 years and a Market researcher living in a residence hall, or have one of several medical conditions, you need to get vaccinated against meningococcal disease. You may also need additional booster doses.  Hepatitis A vaccine.** / Consult your health care provider.  Hepatitis B vaccine.** / Consult your health care provider.  Haemophilus influenzae type b (Hib) vaccine.** / Consult your health care provider. Ages 7 to 53 years  Blood pressure check.** / Every year.  Lipid and cholesterol check.** / Every 5 years beginning at age 25 years.  Lung cancer screening. / Every year if you are aged 11-80 years and have a 30-pack-year history of smoking and currently smoke or have quit within the past 15 years. Yearly screening is stopped once you have quit smoking for at least 15 years or develop a health problem that would prevent you from having lung cancer treatment.  Clinical breast exam.** / Every year after age 48 years.  BRCA-related cancer risk assessment.** / For women who have family members with a BRCA-related cancer (breast, ovarian, tubal, or peritoneal cancers).  Mammogram.** / Every year beginning at age 41 years and continuing for as long as you are in good health. Consult with your health care provider.  Pap test.** / Every 3 years starting at age 65 years through age 37 or 70 years with a history of 3 consecutive normal Pap tests.  HPV screening.** / Every 3 years from ages 72 years through ages 60 to 40 years with a history of 3 consecutive normal Pap tests.  Fecal occult blood test (FOBT) of stool. / Every year beginning at age 21 years and continuing until age 5 years. You may not need to do this test if you get  a colonoscopy every 10 years.  Flexible sigmoidoscopy or colonoscopy.** / Every 5 years for a flexible sigmoidoscopy or every 10 years for a colonoscopy beginning at age 35 years and continuing until age 48 years.  Hepatitis C blood test.** / For all people born from [birth year] through 1965 and any individual with known risks for hepatitis C.  Skin self-exam. / Monthly.  Influenza vaccine. / Every year.  Tetanus, diphtheria, and acellular pertussis (Tdap/Td) vaccine.** / Consult your health care provider. Pregnant women should receive 1 dose of Tdap vaccine during each pregnancy. 1 dose of Td every 10 years.  Varicella vaccine.** / Consult your health care provider. Pregnant females who do not have evidence of immunity should receive the first dose after pregnancy.  Zoster vaccine.** / 1 dose for adults aged 30 years or older.  Measles, mumps, rubella (MMR) vaccine.** / You need at least 1 dose of MMR if you were born in 1957 or later. You may also need a second dose. For females of childbearing age, rubella immunity should be determined. If there is no evidence of immunity, females who are not pregnant should be vaccinated. If there is no evidence of immunity, females who are pregnant should delay immunization until after pregnancy.  Pneumococcal 13-valent conjugate (PCV13) vaccine.** / Consult your health care provider.  Pneumococcal polysaccharide (PPSV23) vaccine.** / 1 to 2 doses if you smoke cigarettes or if you have certain conditions.  Meningococcal vaccine.** /  Consult your health care provider.  Hepatitis A vaccine.** / Consult your health care provider.  Hepatitis B vaccine.** / Consult your health care provider.  Haemophilus influenzae type b (Hib) vaccine.** / Consult your health care provider. Ages 64 years and over  Blood pressure check.** / Every year.  Lipid and cholesterol check.** / Every 5 years beginning at age 23 years.  Lung cancer screening. / Every year if you  are aged 16-80 years and have a 30-pack-year history of smoking and currently smoke or have quit within the past 15 years. Yearly screening is stopped once you have quit smoking for at least 15 years or develop a health problem that would prevent you from having lung cancer treatment.  Clinical breast exam.** / Every year after age 74 years.  BRCA-related cancer risk assessment.** / For women who have family members with a BRCA-related cancer (breast, ovarian, tubal, or peritoneal cancers).  Mammogram.** / Every year beginning at age 44 years and continuing for as long as you are in good health. Consult with your health care provider.  Pap test.** / Every 3 years starting at age 58 years through age 22 or 39 years with 3 consecutive normal Pap tests. Testing can be stopped between 65 and 70 years with 3 consecutive normal Pap tests and no abnormal Pap or HPV tests in the past 10 years.  HPV screening.** / Every 3 years from ages 64 years through ages 70 or 61 years with a history of 3 consecutive normal Pap tests. Testing can be stopped between 65 and 70 years with 3 consecutive normal Pap tests and no abnormal Pap or HPV tests in the past 10 years.  Fecal occult blood test (FOBT) of stool. / Every year beginning at age 40 years and continuing until age 27 years. You may not need to do this test if you get a colonoscopy every 10 years.  Flexible sigmoidoscopy or colonoscopy.** / Every 5 years for a flexible sigmoidoscopy or every 10 years for a colonoscopy beginning at age 7 years and continuing until age 32 years.  Hepatitis C blood test.** / For all people born from [birth year] through 1965 and any individual with known risks for hepatitis C.  Osteoporosis screening.** / A one-time screening for women ages 30 years and over and women at risk for fractures or osteoporosis.  Skin self-exam. / Monthly.  Influenza vaccine. / Every year.  Tetanus, diphtheria, and acellular pertussis (Tdap/Td)  vaccine.** / 1 dose of Td every 10 years.  Varicella vaccine.** / Consult your health care provider.  Zoster vaccine.** / 1 dose for adults aged 35 years or older.  Pneumococcal 13-valent conjugate (PCV13) vaccine.** / Consult your health care provider.  Pneumococcal polysaccharide (PPSV23) vaccine.** / 1 dose for all adults aged 46 years and older.  Meningococcal vaccine.** / Consult your health care provider.  Hepatitis A vaccine.** / Consult your health care provider.  Hepatitis B vaccine.** / Consult your health care provider.  Haemophilus influenzae type b (Hib) vaccine.** / Consult your health care provider. ** Family history and personal history of risk and conditions may change your health care provider's recommendations.   This information is not intended to replace advice given to you by your health care provider. Make sure you discuss any questions you have with your health care provider.   Document Released: 01/13/2002 Document Revised: 12/08/2014 Document Reviewed: 04/14/2011 Elsevier Interactive Patient Education [employer].

## 2016-01-11 LAB — COMPREHENSIVE METABOLIC PANEL
ALT: 11 U/L (ref 0–35)
AST: 13 U/L (ref 0–37)
Albumin: 3.9 g/dL (ref 3.5–5.2)
Alkaline Phosphatase: 79 U/L (ref 39–117)
BUN: 8 mg/dL (ref 6–23)
CALCIUM: 8.7 mg/dL (ref 8.4–10.5)
CO2: 32 mEq/L (ref 19–32)
Chloride: 101 mEq/L (ref 96–112)
Creatinine, Ser: 0.76 mg/dL (ref 0.40–1.20)
GFR: 115.37 mL/min (ref >60.00)
Glucose, Bld: 90 mg/dL (ref 70–99)
POTASSIUM: 3.1 meq/L — AB (ref 3.5–5.1)
Sodium: 138 mEq/L (ref 135–145)
TOTAL PROTEIN: 7.2 g/dL (ref 6.0–8.3)
Total Bilirubin: 0.2 mg/dL (ref 0.2–1.2)

## 2016-01-11 LAB — LIPID PANEL
CHOLESTEROL: 193 mg/dL (ref 0–200)
HDL: 39.2 mg/dL (ref >39.00)
LDL Cholesterol: 130 mg/dL — ABNORMAL HIGH (ref 0–99)
NonHDL: 153.43
Total CHOL/HDL Ratio: 5
Triglycerides: 116 mg/dL (ref 0.0–149.0)
VLDL: 23.2 mg/dL (ref 0.0–40.0)

## 2016-01-11 LAB — TSH: TSH: 1.72 u[IU]/mL (ref 0.35–4.50)

## 2016-01-12 LAB — CBC WITH DIFFERENTIAL/PLATELET
Basophils Absolute: 0 10*3/uL (ref 0.0–0.1)
Basophils Relative: 0.3 % (ref 0.0–3.0)
Eosinophils Absolute: 0.1 10*3/uL (ref 0.0–0.7)
Eosinophils Relative: 0.8 % (ref 0.0–5.0)
HEMATOCRIT: 34.5 % — AB (ref 36.0–46.0)
Hemoglobin: 10.3 g/dL — ABNORMAL LOW (ref 12.0–15.0)
LYMPHS PCT: 32.9 % (ref 12.0–46.0)
Lymphs Abs: 2.1 10*3/uL (ref 0.7–4.0)
MCHC: 29.9 g/dL — ABNORMAL LOW (ref 30.0–36.0)
MCV: 86.3 fl (ref 78.0–100.0)
MONOS PCT: 8.6 % (ref 3.0–12.0)
Monocytes Absolute: 0.6 10*3/uL (ref 0.1–1.0)
Neutro Abs: 3.7 10*3/uL (ref 1.4–7.7)
Neutrophils Relative %: 57.4 % (ref 43.0–77.0)
Platelets: 427 10*3/uL — ABNORMAL HIGH (ref 150.0–400.0)
RBC: 4 Mil/uL (ref 3.87–5.11)
RDW: 17.8 % — ABNORMAL HIGH (ref 11.5–15.5)
WBC: 6.4 10*3/uL (ref 4.0–10.5)

## 2016-01-14 LAB — CYTOLOGY - PAP

## 2016-01-16 ENCOUNTER — Other Ambulatory Visit: Payer: Self-pay | Admitting: Family Medicine

## 2016-01-16 ENCOUNTER — Telehealth: Payer: Self-pay | Admitting: Family Medicine

## 2016-01-16 DIAGNOSIS — E876 Hypokalemia: Secondary | ICD-10-CM

## 2016-01-16 DIAGNOSIS — D509 Iron deficiency anemia, unspecified: Secondary | ICD-10-CM

## 2016-01-16 NOTE — Telephone Encounter (Signed)
Pt notified of lab results. Stated an understanding and pt was scheduled.

## 2016-01-16 NOTE — Telephone Encounter (Signed)
Pt is returning call for lab results.   CB: [id number]

## 2016-02-14 ENCOUNTER — Other Ambulatory Visit: Payer: BC Managed Care – PPO

## 2016-11-04 ENCOUNTER — Ambulatory Visit (INDEPENDENT_AMBULATORY_CARE_PROVIDER_SITE_OTHER): Payer: BC Managed Care – PPO | Admitting: Family Medicine

## 2016-11-04 ENCOUNTER — Encounter: Payer: Self-pay | Admitting: Family Medicine

## 2016-11-04 VITALS — BP 122/78 | Temp 98.0°F | Resp 16 | Ht 75.0 in | Wt 309.8 lb

## 2016-11-04 DIAGNOSIS — Z3009 Encounter for other general counseling and advice on contraception: Secondary | ICD-10-CM | POA: Diagnosis not present

## 2016-11-04 NOTE — Patient Instructions (Signed)
Natural Family Planning Natural Family Planning (NFP) is a type of birth control without using any form of contraception. Women who use NFP should not have sexual intercourse when the ovary produces an egg (ovulation) during the menstrual cycle. The NFP method is safe and can prevent pregnancy. It is 75% effective when practiced right. The man needs to also understand this method of birth control and the woman needs to be aware of how her body functions during her menstrual cycle. NFP can also be used as a method of getting pregnant.  HOW THE NFP METHOD WORKS  A woman's menstrual period usually happens every 28-30 days (it can vary from 23-35 days).  Ovulation happens 12-14 days before the start of the next menstrual period (the fertile period). The egg is fertile for 24 hours and the sperm can live for 3 days or more. If there is sexual intercourse at this time, pregnancy can occur. THERE ARE MANY TYPES OF NFP METHODS USED TO PREVENT PREGNANCY  The basal body temperature method. Often times, there is a slight increase of body temperature when a woman ovulates. Take your temperature every morning before getting out of bed. Write the temperature on a chart. An increase in the temperature shows ovulation has happened. Do not have sexual intercourse from the menstrual period up to three days after the increase in the temperature. Note that the body temperature may increase as a result of fever, restless sleep, and working schedules.  The ovulation cervical mucus method. During the menstrual cycle, the cervical mucus changes from dry and sticky to wet and slippery. Check the mucus of the vagina every day to look for these changes. Just before ovulation, the mucus becomes wet and slippery. On the last day of wetness, ovulation happens. To avoid getting pregnant, sexual intercourse is safe for about 10 days after the menstrual period and on the dry mucus days. Do not have sexual intercourse when the mucus  starts to show up and not until 4 days after the wet and slippery mucus goes away. Sexual intercourse after the 4 days have passed until the menstrual period starts is a safe time. Note that the mucus from the vagina can increase because of a vaginal or cervical infection, lubricants, some medicines, and sexual excitement.  The symptothermal method. This method uses both the temperature and the ovulation methods. Combine the two methods above to prevent pregnancy.  The calendar method. Record your menstrual periods and length of the cycles for 6 months. This is helpful when the menstrual cycle varies in the length of the cycle. The length of a menstrual cycle is from day 1 of the present menstrual period to day 1 of the next menstrual period. Then, find your fertile days of the month and do not have sexual intercourse during that time. You may need help from your health care provider to find out your fertile days. There are some signs of ovulation that may be helpful when trying to find the time of ovulation. This includes vaginal spotting or abdominal cramps during the middle of your menstrual cycle. Not all women have these symptoms. YOU SHOULD NOT USE NFP IF:  You have very irregular menstrual periods and may skip months.  You have abnormal bleeding.  You have a vaginal or cervical infection.  You are on medicines that can affect the vaginal mucus or body temperature. These medicines include antibiotics, thyroid medicines, and antihistamines (cold and allergy medicine). This information is not intended to replace advice given  to you by your health care provider. Make sure you discuss any questions you have with your health care provider. Document Released: 05/05/2008 Document Revised: 11/22/2013 Document Reviewed: 05/20/2013 Elsevier Interactive Patient Education  2017 [employer].

## 2016-11-04 NOTE — Progress Notes (Signed)
Pre visit review using our clinic review tool, if applicable. No additional management support is needed unless otherwise documented below in the visit note. 

## 2016-11-04 NOTE — Progress Notes (Signed)
Patient ID: Candice Kane, female    DOB: [birth year]  Age: 30 y.o. MRN: [id number]    Subjective:  Subjective  HPI Candice Kane presents to discuss her and her sig other getting pregnant.  They have been trying for 2 1/2 years and have not been able to get pregnant.  She has used an ovulation test and know she is ovulating.   She is taking PNV but not regularly.  Her boyfriend has not been tested.  She has had 2 prior normal pregnancies and says her boyfriend got a girl pregnant but it was terminated.  She is not sure she believes him and he smokes marijuana regularly.  Review of Systems  Constitutional: Negative for appetite change, diaphoresis, fatigue and unexpected weight change.  Eyes: Negative for pain, redness and visual disturbance.  Respiratory: Negative for cough, chest tightness, shortness of breath and wheezing.   Cardiovascular: Negative for chest pain, palpitations and leg swelling.  Endocrine: Negative for cold intolerance, heat intolerance, polydipsia, polyphagia and polyuria.  Genitourinary: Negative for difficulty urinating, dysuria and frequency.  Neurological: Negative for dizziness, light-headedness, numbness and headaches.    History Past Medical History:  Diagnosis Date  . Anemia   . Asthma   . Bronchitis    recent occurance  . Chest pain   . COPD (chronic obstructive pulmonary disease) ([place])   . Cough   . Goiter April 2014  . History of chlamydia 2011  . Lump of skin of back    Lower Right side - Lipoma  . Varicose veins     She has a past surgical history that includes Tonsilectomy, adenoidectomy, bilateral myringotomy and tubes.   Her family history includes COPD in her maternal grandfather; Cancer in her mother; Colon cancer in her maternal grandmother; Diabetes in her father; Hyperlipidemia in her father; Hypertension in her father.She reports that she has quit smoking. Her smoking use included Cigars. She has never used smokeless tobacco. She reports  that she drinks alcohol. She reports that she does not use drugs.  Current Outpatient Prescriptions on File Prior to Visit  Medication Sig Dispense Refill  . Multiple Vitamins-Minerals (MULTIVITAMIN WITH MINERALS) tablet Take 1 tablet by mouth daily.    . fluticasone (FLONASE) 50 MCG/ACT nasal spray Place 2 sprays into both nostrils daily. (Patient not taking: Reported on 11/04/2016) 16 g 6  . Pseudoephedrine-APAP-DM (DAYQUIL PO) Take by mouth.    . Pseudoephedrine-Ibuprofen (ADVIL COLD/SINUS PO) Take by mouth.     No current facility-administered medications on file prior to visit.      Objective:  Objective  Physical Exam  Constitutional: She is oriented to person, place, and time. She appears well-developed and well-nourished.  HENT:  Head: Normocephalic and atraumatic.  Eyes: Conjunctivae and EOM are normal.  Neck: Normal range of motion. Neck supple. No JVD present. Carotid bruit is not present. No thyromegaly present.  Cardiovascular: Normal rate, regular rhythm and normal heart sounds.   No murmur heard. Pulmonary/Chest: Effort normal and breath sounds normal. No respiratory distress. She has no wheezes. She has no rales. She exhibits no tenderness.  Musculoskeletal: She exhibits no edema.  Neurological: She is alert and oriented to person, place, and time.  Psychiatric: She has a normal mood and affect. Her behavior is normal. Judgment and thought content normal.  Nursing note and vitals reviewed.  BP 122/78 (BP Location: Left Arm, Patient Position: Sitting, Cuff Size: Normal)   Temp 98 F (36.7 C)   Resp 16   Ht  6\' 3"  (1.905 m)   Wt (!) 309 lb 12.8 oz (140.5 kg)   LMP 10/08/2016 (Exact Date)   BMI 38.72 kg/m  Wt Readings from Last 3 Encounters:  11/04/16 (!) 309 lb 12.8 oz (140.5 kg)  01/10/16 (!) 314 lb 6.4 oz (142.6 kg)  08/17/14 (!) 324 lb 4.8 oz (147.1 kg)     Lab Results  Component Value Date   WBC 6.4 01/10/2016   HGB 10.3 (L) 01/10/2016   HCT 34.5 (L)  01/10/2016   PLT 427.0 (H) 01/10/2016   GLUCOSE 90 01/10/2016   CHOL 193 01/10/2016   TRIG 116.0 01/10/2016   HDL 39.20 01/10/2016   LDLCALC 130 (H) 01/10/2016   ALT 11 01/10/2016   AST 13 01/10/2016   NA 138 01/10/2016   K 3.1 (L) 01/10/2016   CL 101 01/10/2016   CREATININE 0.76 01/10/2016   BUN 8 01/10/2016   CO2 32 01/10/2016   TSH 1.72 01/10/2016    No results found.   Assessment & Plan:  Plan  I am having Ms. Kalafut maintain her multivitamin with minerals, Pseudoephedrine-APAP-DM (DAYQUIL PO), Pseudoephedrine-Ibuprofen (ADVIL COLD/SINUS PO), and fluticasone.  No orders of the defined types were placed in this encounter.   Problem List Items Addressed This Visit    None    Visit Diagnoses    Family planning    -  Primary    take pnv daily Use ovulation test  F/u OB Boyfriend should be tested as well  Follow-up: No Follow-up on file.  Ann Held, DO

## 2018-04-29 ENCOUNTER — Encounter (HOSPITAL_COMMUNITY): Payer: Self-pay | Admitting: Emergency Medicine

## 2018-04-29 ENCOUNTER — Emergency Department (HOSPITAL_COMMUNITY)
Admission: EM | Admit: 2018-04-29 | Discharge: 2018-04-30 | Disposition: A | Discharge status: Home / Self Care | Payer: BC Managed Care – PPO | Attending: Emergency Medicine | Admitting: Emergency Medicine

## 2018-04-29 ENCOUNTER — Other Ambulatory Visit: Payer: Self-pay

## 2018-04-29 DIAGNOSIS — M79602 Pain in left arm: Secondary | ICD-10-CM | POA: Diagnosis present

## 2018-04-29 DIAGNOSIS — Z87891 Personal history of nicotine dependence: Secondary | ICD-10-CM | POA: Insufficient documentation

## 2018-04-29 DIAGNOSIS — L03114 Cellulitis of left upper limb: Secondary | ICD-10-CM | POA: Insufficient documentation

## 2018-04-29 DIAGNOSIS — J449 Chronic obstructive pulmonary disease, unspecified: Secondary | ICD-10-CM | POA: Diagnosis not present

## 2018-04-29 DIAGNOSIS — Z79899 Other long term (current) drug therapy: Secondary | ICD-10-CM | POA: Insufficient documentation

## 2018-04-29 DIAGNOSIS — Y999 Unspecified external cause status: Secondary | ICD-10-CM | POA: Insufficient documentation

## 2018-04-29 DIAGNOSIS — M542 Cervicalgia: Secondary | ICD-10-CM | POA: Insufficient documentation

## 2018-04-29 DIAGNOSIS — Y929 Unspecified place or not applicable: Secondary | ICD-10-CM | POA: Insufficient documentation

## 2018-04-29 DIAGNOSIS — Y939 Activity, unspecified: Secondary | ICD-10-CM | POA: Insufficient documentation

## 2018-04-29 NOTE — ED Notes (Signed)
See EDP assessment 
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
## 2018-04-30 MED ORDER — CEPHALEXIN 500 MG PO CAPS: 500.0000 mg | ORAL_CAPSULE | Freq: Four times a day (QID) | ORAL | 0 refills | Status: AC

## 2018-04-30 MED ORDER — CEPHALEXIN 250 MG PO CAPS
500.0000 mg | ORAL_CAPSULE | Freq: Once | ORAL | Status: AC
  Administered 2018-04-30: 500 mg via ORAL
  Filled 2018-04-30: qty 2

## 2018-04-30 MED ORDER — IBUPROFEN 800 MG PO TABS
800.0000 mg | ORAL_TABLET | Freq: Once | ORAL | Status: AC
  Administered 2018-04-30: 800 mg via ORAL
  Filled 2018-04-30: qty 1

## 2018-04-30 MED ORDER — HYDROXYZINE HCL 25 MG PO TABS
25.0000 mg | ORAL_TABLET | Freq: Once | ORAL | Status: AC
  Administered 2018-04-30: 25 mg via ORAL
  Filled 2018-04-30: qty 1

## 2018-04-30 MED ORDER — IBUPROFEN 800 MG PO TABS: 800.0000 mg | ORAL_TABLET | Freq: Three times a day (TID) | ORAL | 0 refills | Status: DC

## 2018-04-30 MED ORDER — CYCLOBENZAPRINE HCL 10 MG PO TABS: 10.0000 mg | ORAL_TABLET | Freq: Two times a day (BID) | ORAL | 0 refills | Status: DC | PRN

## 2018-04-30 MED ORDER — HYDROXYZINE HCL 25 MG PO TABS: 25.0000 mg | ORAL_TABLET | Freq: Four times a day (QID) | ORAL | 0 refills | Status: DC

## 2018-04-30 NOTE — ED Notes (Signed)
ED Provider at bedside. 

## 2018-04-30 NOTE — ED Notes (Signed)
Redness marked with skin pen on pt's left upper arm.
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
## 2018-07-24 ENCOUNTER — Encounter (HOSPITAL_COMMUNITY): Payer: Self-pay | Admitting: Emergency Medicine

## 2018-07-24 ENCOUNTER — Emergency Department (HOSPITAL_COMMUNITY)
Admission: EM | Admit: 2018-07-24 | Discharge: 2018-07-24 | Disposition: A | Discharge status: Home / Self Care | Payer: BC Managed Care – PPO | Attending: Emergency Medicine | Admitting: Emergency Medicine

## 2018-07-24 DIAGNOSIS — M545 Low back pain, unspecified: Secondary | ICD-10-CM

## 2018-07-24 DIAGNOSIS — Z87891 Personal history of nicotine dependence: Secondary | ICD-10-CM | POA: Diagnosis not present

## 2018-07-24 DIAGNOSIS — J449 Chronic obstructive pulmonary disease, unspecified: Secondary | ICD-10-CM | POA: Insufficient documentation

## 2018-07-24 DIAGNOSIS — Z79899 Other long term (current) drug therapy: Secondary | ICD-10-CM | POA: Diagnosis not present

## 2018-07-24 MED ORDER — LIDOCAINE 5 % EX PTCH
1.0000 | MEDICATED_PATCH | CUTANEOUS | Status: DC
  Administered 2018-07-24: 1 via TRANSDERMAL
  Filled 2018-07-24: qty 1

## 2018-07-24 MED ORDER — IBUPROFEN 800 MG PO TABS: 800.0000 mg | ORAL_TABLET | Freq: Three times a day (TID) | ORAL | 0 refills | Status: DC

## 2018-07-24 NOTE — ED Triage Notes (Signed)
Pt presents to eD for assessment of lower back pain since Tuesday, states she has spasms which cause her to not be able to stand up when she tries to stand up from sitting, especially from the touilet.  States pain is as low as her tailbone, and radiates across her lowerb ack.  Denies known injury, new bed or pillows

## 2018-07-24 NOTE — Discharge Instructions (Addendum)
Please continue Ibuprofen and Flexeril for muscle pain and spasms Try lidocaine patches (they are over the counter) Continue heating pads for stiff/sore muscles

## 2018-07-24 NOTE — ED Provider Notes (Signed)
[facility] EMERGENCY DEPARTMENT Provider Note   CSN: [id number] Arrival date & time: 07/24/18  1604     History   Chief Complaint Chief Complaint  Patient presents with  . Back Pain    HPI Candice Kane is a 32 y.o. female who presents with low back pain. PMH significant for asthma, COPD. She states that she woke up with low back pain about two days ago. It is over the lower back and radiates across the low back when she moves. It is a sharp pain. It does not radiate down the legs. No leg weakness, numbness, tingling. It hurts when she sits for a long time and then gets up to walk. She has been having more difficulty walking that today she went had to use a wheel chair to get around. She denies any specific trauma but thinks it might be related to when she was pushing a dresser last week. 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 No fever, syncope, trauma, unexplained weight loss, hx of cancer, loss of bowel/bladder function, saddle anesthesia, urinary retention, IVDU. She took a 800mg  Ibuprofen and Flexeril today without significant relief. She has been using heating pads which seems to help.  HPI  Past Medical History:  Diagnosis Date  . Anemia   . Asthma   . Bronchitis    recent occurance  . Chest pain   . COPD (chronic obstructive pulmonary disease) ([place])   . Cough   . Goiter April 2014  . History of chlamydia 2011  . Lump of skin of back    Lower Right side - Lipoma  . Varicose veins     Patient Active Problem List   Diagnosis Date Noted  . Varicose veins of lower extremities with other complications 05/26/9484  . Goiter 03/10/2013  . Lipoma of back 03/10/2013    Past Surgical History:  Procedure Laterality Date  . TONSILECTOMY, ADENOIDECTOMY, BILATERAL MYRINGOTOMY AND TUBES       OB History    Gravida  1   Para  1   Term  1   Preterm      AB      Living        SAB      TAB      Ectopic     Multiple      Live Births               Home Medications    Prior to Admission medications   Medication Sig Start Date End Date Taking? Authorizing Provider  cyclobenzaprine (FLEXERIL) 10 MG tablet Take 1 tablet (10 mg total) by mouth 2 (two) times daily as needed for muscle spasms. 04/30/18   McDonald, Mia A, PA-C  fluticasone (FLONASE) 50 MCG/ACT nasal spray Place 2 sprays into both nostrils daily. Patient not taking: Reported on 11/04/2016 01/10/16   Roma Schanz R, DO  hydrOXYzine (ATARAX/VISTARIL) 25 MG tablet Take 1 tablet (25 mg total) by mouth every 6 (six) hours. 04/30/18   McDonald, Mia A, PA-C  ibuprofen (ADVIL,MOTRIN) 800 MG tablet Take 1 tablet (800 mg total) by mouth 3 (three) times daily. 04/30/18   McDonald, Mia A, PA-C  Multiple Vitamins-Minerals (MULTIVITAMIN WITH MINERALS) tablet Take 1 tablet by mouth daily.    [provider]  Pseudoephedrine-APAP-DM (DAYQUIL PO) Take by mouth.    [provider]  Pseudoephedrine-Ibuprofen (ADVIL COLD/SINUS PO) Take by mouth.    [provider]    Family History Family History  Problem Relation Age of Onset  . Cancer Mother        breast  . Diabetes Father   . Hyperlipidemia Father   . Hypertension Father   . Colon cancer Maternal Grandmother        colon  . COPD Maternal Grandfather     Social History Social History   Tobacco Use  . Smoking status: Former Smoker    Types: Cigars  . Smokeless tobacco: Never Used  Substance Use Topics  . Alcohol use: Yes    Comment: rare  . Drug use: No     Allergies   Patient has no known allergies.   Review of Systems Review of Systems  Constitutional: Negative for fever.  Gastrointestinal: Negative for abdominal pain.  Musculoskeletal: Positive for back pain and myalgias.  Neurological: Negative for weakness and numbness.     Physical Exam Updated Vital Signs BP 123/85 (BP Location: Right Arm)   Pulse 80   Temp 98 F (36.7 C)  (Oral)   Resp 16   Ht 6\' 1"  (1.854 m)   Wt (!) 139.3 kg   LMP 07/22/2018   SpO2 100%   BMI 40.50 kg/m   Physical Exam  Constitutional: She is oriented to person, place, and time. She appears well-developed and well-nourished. No distress.  Calm and cooperative  HENT:  Head: Normocephalic and atraumatic.  Eyes: Pupils are equal, round, and reactive to light. Conjunctivae are normal. Right eye exhibits no discharge. Left eye exhibits no discharge. No scleral icterus.  Neck: Normal range of motion.  Cardiovascular: Normal rate.  Pulmonary/Chest: Effort normal. No respiratory distress.  Abdominal: She exhibits no distension.  Musculoskeletal:  Back: Inspection: No masses, deformity, or rash Palpation: No midline spinal tenderness. No paraspinal muscle tenderness. Pain is only elicited with movement. Strength: 5/5 in lower extremities and normal plantar and dorsiflexion Sensation: Intact sensation with light touch in lower extremities bilaterally Reflexes: Patellar reflex is 2+ bilaterally SLR: Negative seated straight leg raise Gait: Antalgic gait   Neurological: She is alert and oriented to person, place, and time.  Skin: Skin is warm and dry.  Psychiatric: She has a normal mood and affect. Her behavior is normal.  Nursing note and vitals reviewed.    ED Treatments / Results  Labs (all labs ordered are listed, but only abnormal results are displayed) Labs Reviewed - No data to display  EKG None  Radiology No results found.  Procedures Procedures (including critical care time)  Medications Ordered in ED Medications  lidocaine (LIDODERM) 5 % 1 patch (has no administration in time range)     Initial Impression / Assessment and Plan / ED Course  I have reviewed the triage vital signs and the nursing notes.  Pertinent labs & imaging results that were available during my care of the patient were reviewed by me and considered in my medical decision making (see chart  for details).  32 year old female presents with acute onset of low back pain without any significant injury or trauma.  Her vital signs are normal.  Her back is nontender to palpation however it is painful when she gets up to ambulate.  She is 5 out of 5 strength in her lower extremities and intact reflexes.  No red flags on history or exam.  Likely muscle strain. Discussed imaging was not recommended today and she verbalized understanding. She did get mild relief with heating pads, ibuprofen, Flexeril.  We will  give her a lidocaine patch here in the ED and advised gentle stretching, massage, continuing medicines and to follow-up with her doctor for follow-up.    Final Clinical Impressions(s) / ED Diagnoses   Final diagnoses:  Acute midline low back pain without sciatica    ED Discharge Orders    None       Recardo Evangelist, PA-C 07/24/18 1738    Virgel Manifold, MD 07/29/18 [phone]
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
## 2018-07-24 NOTE — ED Notes (Signed)
Called for patient x 3 with no response

## 2018-07-24 NOTE — ED Notes (Signed)
Declined W/C at D/C and was escorted to lobby by RN. 

## 2019-03-10 ENCOUNTER — Telehealth: Payer: Self-pay

## 2019-03-10 NOTE — Telephone Encounter (Signed)
Last ov 2017- virtual visit?

## 2019-03-16 NOTE — Telephone Encounter (Signed)
Appointment made for tomorrow

## 2019-03-17 ENCOUNTER — Other Ambulatory Visit: Payer: Self-pay

## 2019-03-17 ENCOUNTER — Encounter: Payer: BC Managed Care – PPO | Admitting: Family Medicine

## 2019-03-18 ENCOUNTER — Telehealth: Payer: Self-pay | Admitting: Family Medicine

## 2019-03-18 NOTE — Telephone Encounter (Signed)
-----   Message from Ann Held, DO sent at 03/18/2019  8:43 AM EDT ----- Would you please take this pt off my schedule from yesterday--- she never answered the  [email or web address]  Thank you

## 2019-03-18 NOTE — Telephone Encounter (Signed)
The appt check in was canceled but the appt was not able to cancel it since was yesterday.(has  note in tel note from provider)

## 2019-05-03 ENCOUNTER — Inpatient Hospital Stay (HOSPITAL_COMMUNITY)
Admission: EM | Admit: 2019-05-03 | Discharge: 2019-05-03 | Disposition: A | Discharge status: Home / Self Care | Payer: BC Managed Care – PPO | Attending: Obstetrics & Gynecology | Admitting: Obstetrics & Gynecology

## 2019-05-03 ENCOUNTER — Other Ambulatory Visit: Payer: Self-pay

## 2019-05-03 DIAGNOSIS — N939 Abnormal uterine and vaginal bleeding, unspecified: Secondary | ICD-10-CM | POA: Diagnosis not present

## 2019-05-03 LAB — POCT PREGNANCY, URINE: Preg Test, Ur: NEGATIVE

## 2019-05-03 NOTE — MAU Provider Note (Signed)
First Provider Initiated Contact with Patient 05/03/19 0534      S Ms. Ebonique Hallstrom is a 33 y.o. G7P1000 non-pregnant female who presents to MAU today with complaint of vaginal bleeding s/p positive home UPT.  She reports she started bleeding yesterday and has passed several clots.    O BP 127/85   Pulse 85   Temp 98.7 F (37.1 C)   Resp 19   SpO2 100%  Physical Exam  Constitutional: She is oriented to person, place, and time. She appears well-developed.  HENT:  Head: Normocephalic and atraumatic.  Eyes: Conjunctivae are normal.  Neck: Normal range of motion.  Cardiovascular: Normal rate.  Respiratory: Effort normal.  Musculoskeletal: Normal range of motion.  Neurological: She is alert and oriented to person, place, and time.  Skin: Skin is warm and dry.  Psychiatric: She has a normal mood and affect. Her behavior is normal.    A Non pregnant female Medical screening exam complete Vaginal Bleeding  P Patient informed of Negative UPT today in MAU. Offered and declined hCG testing today. Questions and concerns addressed regarding why UPT may return negative today. Encouraged to follow up with primary ob. Patient given the option of transfer to [facility] for further evaluation or seek care in outpatient facility of choice. List of options for follow-up given. Warning signs for worsening condition that would warrant emergency follow-up discussed. Patient may return to MAU as needed for pregnancy related complaints. Discharged from MAU in stable condition  Gavin Pound, [place] 05/03/2019 5:36 AM

## 2019-05-03 NOTE — MAU Note (Addendum)
Started having spotting last night and filled 2 panty liners with blood.  Went to the restroom and had a lot more come out with clots.  Had a positive pregnancy test on Saturday (negative one on Friday).  Started having cramping w/ the bleeding-subsided some now.  Hasn't taken anything for her pain.  Not feeling lightheaded or dizzy.  LMP 4/28

## 2019-07-28 ENCOUNTER — Other Ambulatory Visit: Payer: Self-pay

## 2019-07-28 DIAGNOSIS — Z20822 Contact with and (suspected) exposure to covid-19: Secondary | ICD-10-CM

## 2019-07-30 LAB — NOVEL CORONAVIRUS, NAA: SARS-CoV-2, NAA: NOT DETECTED

## 2019-09-10 ENCOUNTER — Encounter (INDEPENDENT_AMBULATORY_CARE_PROVIDER_SITE_OTHER): Payer: Self-pay

## 2019-09-10 ENCOUNTER — Telehealth: Payer: Self-pay | Admitting: Family Medicine

## 2019-09-10 ENCOUNTER — Telehealth: Payer: BC Managed Care – PPO | Admitting: Physician Assistant

## 2019-09-10 DIAGNOSIS — Z20822 Contact with and (suspected) exposure to covid-19: Secondary | ICD-10-CM

## 2019-09-10 NOTE — Progress Notes (Signed)
I have spent 5 minutes in review of e-visit questionnaire, review and updating patient chart, medical decision making and response to patient.   Alford Gamero Cody Jeorge Reister, PA-C    

## 2019-09-10 NOTE — Telephone Encounter (Signed)
Attempted to reach pt several times throughout evening, busy signal each attempt. Received BPA for worsening cough and weakness.Povided instructions on MyChart as to symptoms that warrant ED visit.

## 2019-09-10 NOTE — Progress Notes (Signed)
E-Visit for Corona Virus Screening   Your current symptoms could be consistent with the coronavirus.  Many health care providers can now test patients at their office but not all are.  [facility] has multiple testing sites. For information on our COVID testing locations and hours go to [email or web address]  Please quarantine yourself while awaiting your test results.  We are enrolling you in our MyChart Home Montioring for COVID19 . Daily you will receive a questionnaire within the MyChart website. Our COVID 19 response team willl be monitoriing your responses daily.    COVID-19 is a respiratory illness with symptoms that are similar to the flu. Symptoms are typically mild to moderate, but there have been cases of severe illness and death due to the virus. The following symptoms may appear 2-14 days after exposure: . Fever . Cough . Shortness of breath or difficulty breathing . Chills . Repeated shaking with chills . Muscle pain . Headache . Sore throat . New loss of taste or smell . Fatigue . Congestion or runny nose . Nausea or vomiting . Diarrhea  It is vitally important that if you feel that you have an infection such as this virus or any other virus that you stay home and away from places where you may spread it to others.  You should self-quarantine for 14 days if you have symptoms that could potentially be coronavirus or have been in close contact a with a person diagnosed with COVID-19 within the last 2 weeks. You should avoid contact with people age 65 and older.   You should wear a mask or cloth face covering over your nose and mouth if you must be around other people or animals, including pets (even at home). Try to stay at least 6 feet away from other people. This will protect the people around you.  You may also take acetaminophen (Tylenol) as needed for fever.   Reduce your risk of any infection by using the same precautions used for avoiding the  common cold or flu:  . Wash your hands often with soap and warm water for at least 20 seconds.  If soap and water are not readily available, use an alcohol-based hand sanitizer with at least 60% alcohol.  . If coughing or sneezing, cover your mouth and nose by coughing or sneezing into the elbow areas of your shirt or coat, into a tissue or into your sleeve (not your hands). . Avoid shaking hands with others and consider head nods or verbal greetings only. . Avoid touching your eyes, nose, or mouth with unwashed hands.  . Avoid close contact with people who are sick. . Avoid places or events with large numbers of people in one location, like concerts or sporting events. . Carefully consider travel plans you have or are making. . If you are planning any travel outside or inside the [place], visit the CDC's Travelers' Health webpage for the latest health notices. . If you have some symptoms but not all symptoms, continue to monitor at home and seek medical attention if your symptoms worsen. . If you are having a medical emergency, call 911.  HOME CARE . Only take medications as instructed by your medical team. . Drink plenty of fluids and get plenty of rest. . A steam or ultrasonic humidifier can help if you have congestion.   GET HELP RIGHT AWAY IF YOU HAVE EMERGENCY WARNING SIGNS** FOR COVID-19. If you or someone is showing any of these signs seek emergency medical care immediately. Call   911 or proceed to your closest emergency facility if: . You develop worsening high fever. . Trouble breathing . Bluish lips or face . Persistent pain or pressure in the chest . New confusion . Inability to wake or stay awake . You cough up blood. . Your symptoms become more severe  **This list is not all possible symptoms. Contact your medical provider for any symptoms that are sever or concerning to you.   MAKE SURE YOU   Understand these instructions.  Will watch your condition.  Will get help right  away if you are not doing well or get worse.  Your e-visit answers were reviewed by a board certified advanced clinical practitioner to complete your personal care plan.  Depending on the condition, your plan could have included both over the counter or prescription medications.  If there is a problem please reply once you have received a response from your provider.  Your safety is important to us.  If you have drug allergies check your prescription carefully.    You can use MyChart to ask questions about today's visit, request a non-urgent call back, or ask for a work or school excuse for 24 hours related to this e-Visit. If it has been greater than 24 hours you will need to follow up with your provider, or enter a new e-Visit to address those concerns. You will get an e-mail in the next two days asking about your experience.  I hope that your e-visit has been valuable and will speed your recovery. Thank you for using e-visits.    

## 2019-09-12 ENCOUNTER — Other Ambulatory Visit: Payer: Self-pay

## 2019-09-12 DIAGNOSIS — Z20822 Contact with and (suspected) exposure to covid-19: Secondary | ICD-10-CM

## 2019-09-12 NOTE — Telephone Encounter (Signed)
Left message on machine for patient to call back to make virtual appointment if she is not doing better.

## 2019-09-13 ENCOUNTER — Encounter (INDEPENDENT_AMBULATORY_CARE_PROVIDER_SITE_OTHER): Payer: Self-pay

## 2019-09-13 LAB — NOVEL CORONAVIRUS, NAA: SARS-CoV-2, NAA: NOT DETECTED

## 2019-11-07 ENCOUNTER — Other Ambulatory Visit: Payer: Self-pay

## 2019-11-07 ENCOUNTER — Emergency Department (HOSPITAL_COMMUNITY)
Admission: EM | Admit: 2019-11-07 | Discharge: 2019-11-07 | Disposition: A | Discharge status: Home / Self Care | Payer: BC Managed Care – PPO | Attending: Emergency Medicine | Admitting: Emergency Medicine

## 2019-11-07 ENCOUNTER — Encounter (HOSPITAL_COMMUNITY): Payer: Self-pay | Admitting: *Deleted

## 2019-11-07 DIAGNOSIS — Z79899 Other long term (current) drug therapy: Secondary | ICD-10-CM | POA: Diagnosis not present

## 2019-11-07 DIAGNOSIS — J449 Chronic obstructive pulmonary disease, unspecified: Secondary | ICD-10-CM | POA: Insufficient documentation

## 2019-11-07 DIAGNOSIS — Z87891 Personal history of nicotine dependence: Secondary | ICD-10-CM | POA: Insufficient documentation

## 2019-11-07 DIAGNOSIS — K0889 Other specified disorders of teeth and supporting structures: Secondary | ICD-10-CM | POA: Diagnosis not present

## 2019-11-07 MED ORDER — PENICILLIN V POTASSIUM 500 MG PO TABS: 500.0000 mg | ORAL_TABLET | Freq: Four times a day (QID) | ORAL | 0 refills | Status: AC

## 2019-11-07 MED ORDER — ACETAMINOPHEN 500 MG PO TABS: 1000.0000 mg | ORAL_TABLET | Freq: Three times a day (TID) | ORAL | 0 refills | Status: DC | PRN

## 2019-11-07 MED ORDER — IBUPROFEN 800 MG PO TABS: 800.0000 mg | ORAL_TABLET | Freq: Three times a day (TID) | ORAL | 0 refills | Status: DC

## 2019-11-07 MED ORDER — PENICILLIN V POTASSIUM 250 MG PO TABS
500.0000 mg | ORAL_TABLET | Freq: Once | ORAL | Status: AC
  Administered 2019-11-07: 500 mg via ORAL
  Filled 2019-11-07: qty 2

## 2019-11-07 MED ORDER — HYDROCODONE-ACETAMINOPHEN 5-325 MG PO TABS
2.0000 | ORAL_TABLET | Freq: Once | ORAL | Status: AC
  Administered 2019-11-07: 2 via ORAL
  Filled 2019-11-07: qty 2

## 2019-11-07 NOTE — ED Notes (Signed)
Pt verbalized understanding of d/c instructions and has no further questions, VSS, NAD. Pt has dentist appointment on Thursday morning.

## 2019-11-07 NOTE — ED Triage Notes (Signed)
Toothache since yesterday no temp   12 01

## 2019-11-07 NOTE — ED Provider Notes (Signed)
[facility] EMERGENCY DEPARTMENT Provider Note   CSN: [id number] Arrival date & time: 11/07/19  [id number]     History   Chief Complaint Chief Complaint  Patient presents with  . Dental Pain    HPI Candice Kane is a 33 y.o. female with history of asthma, COPD who presents with left lower dental pain that began yesterday.  Patient reports she has had problems with the tooth in the past.  She believes she may need a root canal.  She has a dentist appointment in 3 days.  She has been taking ibuprofen and Tylenol without relief.  She denies any fevers, neck pain, or any other symptoms.     HPI  Past Medical History:  Diagnosis Date  . Anemia   . Asthma   . Bronchitis    recent occurance  . Chest pain   . COPD (chronic obstructive pulmonary disease) ([place])   . Cough   . Goiter April 2014  . History of chlamydia 2011  . Lump of skin of back    Lower Right side - Lipoma  . Varicose veins     Patient Active Problem List   Diagnosis Date Noted  . Varicose veins of lower extremities with other complications 0000000  . Goiter 03/10/2013  . Lipoma of back 03/10/2013    Past Surgical History:  Procedure Laterality Date  . TONSILECTOMY, ADENOIDECTOMY, BILATERAL MYRINGOTOMY AND TUBES       OB History    Gravida  1   Para  1   Term  1   Preterm      AB      Living        SAB      TAB      Ectopic      Multiple      Live Births               Home Medications    Prior to Admission medications   Medication Sig Start Date End Date Taking? Authorizing Provider  acetaminophen (TYLENOL) 500 MG tablet Take 2 tablets (1,000 mg total) by mouth every 8 (eight) hours as needed for moderate pain. 11/07/19   Andrick Rust, Bea Graff, PA-C  cyclobenzaprine (FLEXERIL) 10 MG tablet Take 1 tablet (10 mg total) by mouth 2 (two) times daily as needed for muscle spasms. 04/30/18   McDonald, Mia A, PA-C  hydrOXYzine (ATARAX/VISTARIL) 25 MG tablet Take 1 tablet (25  mg total) by mouth every 6 (six) hours. 04/30/18   McDonald, Mia A, PA-C  ibuprofen (ADVIL) 800 MG tablet Take 1 tablet (800 mg total) by mouth 3 (three) times daily. 11/07/19   Briauna Gilmartin, Bea Graff, PA-C  Multiple Vitamins-Minerals (MULTIVITAMIN WITH MINERALS) tablet Take 1 tablet by mouth daily.    [provider]  penicillin v potassium (VEETID) 500 MG tablet Take 1 tablet (500 mg total) by mouth 4 (four) times daily for 7 days. 11/07/19 11/14/19  Roseann Kees, Bea Graff, PA-C  Pseudoephedrine-APAP-DM (DAYQUIL PO) Take by mouth.    [provider]  Pseudoephedrine-Ibuprofen (ADVIL COLD/SINUS PO) Take by mouth.    [provider]    Family History Family History  Problem Relation Age of Onset  . Cancer Mother        breast  . Diabetes Father   . Hyperlipidemia Father   . Hypertension Father   . Colon cancer Maternal Grandmother        colon  . COPD Maternal Grandfather  Social History Social History   Tobacco Use  . Smoking status: Former Smoker    Types: Cigars  . Smokeless tobacco: Never Used  Substance Use Topics  . Alcohol use: Yes    Comment: rare  . Drug use: No     Allergies   Patient has no known allergies.   Review of Systems Review of Systems  Constitutional: Negative for chills and fever.  HENT: Positive for dental problem. Negative for facial swelling and sore throat.   Respiratory: Negative for shortness of breath.   Cardiovascular: Negative for chest pain.  Gastrointestinal: Negative for abdominal pain, nausea and vomiting.  Genitourinary: Negative for dysuria.  Musculoskeletal: Negative for back pain and neck pain.  Skin: Negative for rash and wound.  Neurological: Negative for headaches.  Psychiatric/Behavioral: The patient is not nervous/anxious.      Physical Exam Updated Vital Signs BP (!) 157/107 (BP Location: Right Arm)   Pulse 85   Temp 97.8 F (36.6 C) (Oral)   Resp 18   Ht 6\' 1"  (1.854 m)   Wt (!) 300 kg   LMP  11/01/2019   SpO2 99%   BMI 87.26 kg/m   Physical Exam Vitals signs and nursing note reviewed.  Constitutional:      General: She is not in acute distress.    Appearance: She is well-developed. She is not diaphoretic.  HENT:     Head: Normocephalic and atraumatic.     Mouth/Throat:     Pharynx: No oropharyngeal exudate.      Comments: No trismus, submandibular tenderness or masses Eyes:     General: No scleral icterus.       Right eye: No discharge.        Left eye: No discharge.     Conjunctiva/sclera: Conjunctivae normal.     Pupils: Pupils are equal, round, and reactive to light.  Neck:     Musculoskeletal: Normal range of motion and neck supple.     Thyroid: No thyromegaly.  Cardiovascular:     Rate and Rhythm: Normal rate and regular rhythm.     Heart sounds: Normal heart sounds. No murmur. No friction rub. No gallop.   Pulmonary:     Effort: Pulmonary effort is normal. No respiratory distress.     Breath sounds: Normal breath sounds. No stridor. No wheezing or rales.  Abdominal:     General: Bowel sounds are normal. There is no distension.     Palpations: Abdomen is soft.     Tenderness: There is no abdominal tenderness. There is no guarding or rebound.  Lymphadenopathy:     Cervical: No cervical adenopathy.  Skin:    General: Skin is warm and dry.     Coloration: Skin is not pale.     Findings: No rash.  Neurological:     Mental Status: She is alert.     Coordination: Coordination normal.      ED Treatments / Results  Labs (all labs ordered are listed, but only abnormal results are displayed) Labs Reviewed - No data to display  EKG None  Radiology No results found.  Procedures Procedures (including critical care time)  Medications Ordered in ED Medications  penicillin v potassium (VEETID) tablet 500 mg (has no administration in time range)  HYDROcodone-acetaminophen (NORCO/VICODIN) 5-325 MG per tablet 2 tablet (has no administration in time  range)     Initial Impression / Assessment and Plan / ED Course  I have reviewed the triage vital signs and the nursing  notes.  Pertinent labs & imaging results that were available during my care of the patient were reviewed by me and considered in my medical decision making (see chart for details).        Patient with dentalgia.  No abscess requiring immediate incision and drainage.  Exam not concerning for Ludwig's angina or pharyngeal abscess.  Will treat with penicillin, ibuprofen, Tylenol.  Single dose of Vicodin given in the ED, in addition to first dose of antibiotic.  Patient having regular periods, denies pregnancy.  Pt instructed to follow-up with dentist as planned.  Discussed return precautions.  Patient advised to follow-up with PCP for recheck of blood pressure, as it was elevated today, most likely due to pain.  Patient discharged in satisfactory condition.  Final Clinical Impressions(s) / ED Diagnoses   Final diagnoses:  Pain, dental    ED Discharge Orders         Ordered    penicillin v potassium (VEETID) 500 MG tablet  4 times daily     11/07/19 0742    acetaminophen (TYLENOL) 500 MG tablet  Every 8 hours PRN     11/07/19 0742    ibuprofen (ADVIL) 800 MG tablet  3 times daily     11/07/19 0742           Marua Qin, Bea Graff, PA-C 11/07/19 0745    Malvin Johns, MD 11/07/19 0800

## 2019-11-07 NOTE — Discharge Instructions (Addendum)
Take penicillin as prescribed until completed.  Make sure to finish all this medication, even if you are feeling better.  Alternate ibuprofen and Tylenol as prescribed for your pain.  Please see dentist as soon as possible.  Please return to the emergency department if you develop any lockjaw, large masses in your neck, or any other new or concerning symptoms.  Please have your blood pressure rechecked at your primary care provider as it was elevated today.  This may solely be due to your pain.

## 2019-11-14 NOTE — Progress Notes (Signed)
This encounter was created in error - please disregard.

## 2021-01-20 ENCOUNTER — Observation Stay (HOSPITAL_COMMUNITY)
Admission: AD | Admit: 2021-01-20 | Discharge: 2021-01-20 | Disposition: A | Discharge status: Home / Self Care | Payer: Medicaid Other | Attending: Obstetrics & Gynecology | Admitting: Obstetrics & Gynecology

## 2021-01-20 ENCOUNTER — Encounter (HOSPITAL_COMMUNITY): Payer: Self-pay | Admitting: Obstetrics & Gynecology

## 2021-01-20 ENCOUNTER — Other Ambulatory Visit: Payer: Self-pay

## 2021-01-20 DIAGNOSIS — Z20822 Contact with and (suspected) exposure to covid-19: Secondary | ICD-10-CM | POA: Insufficient documentation

## 2021-01-20 DIAGNOSIS — O26892 Other specified pregnancy related conditions, second trimester: Secondary | ICD-10-CM | POA: Diagnosis present

## 2021-01-20 DIAGNOSIS — O034 Incomplete spontaneous abortion without complication: Secondary | ICD-10-CM | POA: Diagnosis not present

## 2021-01-20 DIAGNOSIS — Z3A18 18 weeks gestation of pregnancy: Secondary | ICD-10-CM | POA: Insufficient documentation

## 2021-01-20 DIAGNOSIS — Z87891 Personal history of nicotine dependence: Secondary | ICD-10-CM | POA: Insufficient documentation

## 2021-01-20 DIAGNOSIS — O99512 Diseases of the respiratory system complicating pregnancy, second trimester: Secondary | ICD-10-CM | POA: Insufficient documentation

## 2021-01-20 DIAGNOSIS — J45909 Unspecified asthma, uncomplicated: Secondary | ICD-10-CM | POA: Insufficient documentation

## 2021-01-20 LAB — CBC
HCT: 33.3 % — ABNORMAL LOW (ref 36.0–46.0)
Hemoglobin: 10.4 g/dL — ABNORMAL LOW (ref 12.0–15.0)
MCH: 25.7 pg — ABNORMAL LOW (ref 26.0–34.0)
MCHC: 31.2 g/dL (ref 30.0–36.0)
MCV: 82.4 fL (ref 80.0–100.0)
Platelets: 437 10*3/uL — ABNORMAL HIGH (ref 150–400)
RBC: 4.04 MIL/uL (ref 3.87–5.11)
RDW: 19 % — ABNORMAL HIGH (ref 11.5–15.5)
WBC: 11.4 10*3/uL — ABNORMAL HIGH (ref 4.0–10.5)
nRBC: 0 % (ref 0.0–0.2)

## 2021-01-20 LAB — TYPE AND SCREEN
ABO/RH(D): O POS
Antibody Screen: NEGATIVE

## 2021-01-20 LAB — RESP PANEL BY RT-PCR (FLU A&B, COVID) ARPGX2
Influenza A by PCR: NEGATIVE
Influenza B by PCR: NEGATIVE
SARS Coronavirus 2 by RT PCR: NEGATIVE

## 2021-01-20 MED ORDER — CALCIUM CARBONATE ANTACID 500 MG PO CHEW: 2.0000 | CHEWABLE_TABLET | ORAL | Status: DC | PRN

## 2021-01-20 MED ORDER — MISOPROSTOL 200 MCG PO TABS
800.0000 ug | ORAL_TABLET | Freq: Once | ORAL | Status: AC
  Administered 2021-01-20: 800 ug via BUCCAL

## 2021-01-20 MED ORDER — LACTATED RINGERS IV SOLN: INTRAVENOUS | Status: DC

## 2021-01-20 MED ORDER — FENTANYL CITRATE (PF) 100 MCG/2ML IJ SOLN
50.0000 ug | Freq: Once | INTRAMUSCULAR | Status: AC
  Administered 2021-01-20: 50 ug via INTRAVENOUS
  Filled 2021-01-20: qty 2

## 2021-01-20 MED ORDER — ZOLPIDEM TARTRATE 5 MG PO TABS: 5.0000 mg | ORAL_TABLET | Freq: Every evening | ORAL | Status: DC | PRN

## 2021-01-20 MED ORDER — LACTATED RINGERS IV BOLUS
1000.0000 mL | Freq: Once | INTRAVENOUS | Status: AC
  Administered 2021-01-20: 1000 mL via INTRAVENOUS

## 2021-01-20 MED ORDER — PRENATAL MULTIVITAMIN CH: 1.0000 | ORAL_TABLET | Freq: Every day | ORAL | Status: DC

## 2021-01-20 MED ORDER — IBUPROFEN 800 MG PO TABS: 800.0000 mg | ORAL_TABLET | Freq: Four times a day (QID) | ORAL | 0 refills | Status: DC

## 2021-01-20 MED ORDER — MISOPROSTOL 200 MCG PO TABS
ORAL_TABLET | ORAL | Status: AC
  Filled 2021-01-20: qty 4

## 2021-01-20 MED ORDER — ACETAMINOPHEN 325 MG PO TABS
650.0000 mg | ORAL_TABLET | ORAL | Status: DC | PRN
  Administered 2021-01-20: 650 mg via ORAL
  Filled 2021-01-20: qty 2

## 2021-01-20 MED ORDER — DOCUSATE SODIUM 100 MG PO CAPS: 100.0000 mg | ORAL_CAPSULE | Freq: Every day | ORAL | Status: DC

## 2021-01-20 MED ORDER — IBUPROFEN 800 MG PO TABS
800.0000 mg | ORAL_TABLET | Freq: Four times a day (QID) | ORAL | Status: DC
  Administered 2021-01-20: 800 mg via ORAL
  Filled 2021-01-20: qty 1

## 2021-01-20 NOTE — Discharge Instructions (Signed)
Call MD for T>100.4, heavy vaginal bleeding, severe abdominal pain, or respiratory distress.  Call office to schedule follow up appointment in 2 weeks.

## 2021-01-20 NOTE — Discharge Summary (Signed)
Physician Discharge Summary  Patient ID: Candice Kane MRN: [id number] DOB/AGE: 1986-09-13 35 y.o.  Admit date: 01/20/2021 Discharge date: 01/20/2021  Admission Diagnoses: Abortion at [redacted] weeks gestational age  Discharge Diagnoses:  Active Problems:   Retained products of conception after delivery without hemorrhage   Discharged Condition: good  Hospital Course: Patient presented to [facility]U at [redacted]w[redacted]d with complaint of abdominal and back pain and umbilical cord in vagina.  Shortly after presentation to [facility]U, patient delivery fetus.  She was given buccal cytotec 800 mcg and was admitted for management of placental delivery.  She was given IV fentanyl for pain management and approximately 2 hours after cytotec administration, she delivered intact placenta.  Placental exam was normal and it was sent to pathology.  Bleeding was minimal throughout her stay.  Patient declined fetal testing.  She was discharged home with planned follow up in 2 weeks.    Consults: None  Significant Diagnostic Studies: none  Treatments: analgesia: IV fentanyl  Discharge Exam: Blood pressure 121/76, pulse 77, temperature 99 F (37.2 C), temperature source Oral, resp. rate 18, SpO2 100 %. General appearance: alert, cooperative and appears stated age GI: soft, non-tender; bowel sounds normal; no masses,  no organomegaly Pelvic: external genitalia normal and scant lochia Extremities: extremities normal, atraumatic, no cyanosis or edema  Disposition: Discharge disposition: 01-Home or Self Care        Allergies as of 01/20/2021   No Known Allergies     Medication List    STOP taking these medications   ADVIL COLD/SINUS PO   cyclobenzaprine 10 MG tablet Commonly known as: FLEXERIL   DAYQUIL PO   hydrOXYzine 25 MG tablet Commonly known as: ATARAX/VISTARIL     TAKE these medications   acetaminophen 500 MG tablet Commonly known as: TYLENOL Take 2 tablets (1,000 mg total) by mouth every 8 (eight) hours as  needed for moderate pain.   ibuprofen 800 MG tablet Commonly known as: ADVIL Take 1 tablet (800 mg total) by mouth 4 (four) times daily. What changed: when to take this   multivitamin with minerals tablet Take 1 tablet by mouth daily.        Signed: [name] 01/20/2021, 7:01 PM

## 2021-01-20 NOTE — Progress Notes (Signed)
Checked on patient just as she had delivered placenta in toilet.  She reports mild/moderate cramps but improving.    VSS.  Placenta inspected and intact. No abnormality Pelvic: scan blood on pad with no active bleeding  Will allow patient to eat if desired Placenta sent for pathology Patient is offered fetal testing and declines.  She is deciding on hospital disposal vs funeral, cremation, etc.   Will reassess patient in 3-4 hours to determine if stable for discharge  [name], DO

## 2021-01-20 NOTE — H&P (Signed)
Candice Kane is a 35 y.o. female [phone] at [redacted]w[redacted]d presenting to [facility]U for evaluation of severe cramping and low back pain which started last night.  Patient reports this pain awakened her this morning from sleep and has intensified with time.  This morning while sitting on the toilet, she reports she noticed prolapsed umbilical cord.  Patient states she never had large gush of fluid but felt more discharge within the last few days.  She came to [facility]U by EMS and quickly delivered fetus there.  She was given 800 mcg cytotec buccally.  Currently, she is feeling strong cramps and requests pain medication.  Antepartum course uncomplicated with exception of obesity.  Low risk NIPS.  Small posterior intramural fibroid on first tri screen u/s measuring 3.2 cm.  Elective termination x 2 with medication alone; no D&E.    OB History    Gravida  3   Para  1   Term  1   Preterm      AB  1   Living        SAB  1   IAB      Ectopic      Multiple      Live Births             Past Medical History:  Diagnosis Date  . Anemia   . Asthma   . Bronchitis    recent occurance  . Chest pain   . COPD (chronic obstructive pulmonary disease) ([place])   . Cough   . Goiter April 2014  . History of chlamydia 2011  . Lump of skin of back    Lower Right side - Lipoma  . Varicose veins    Past Surgical History:  Procedure Laterality Date  . TONSILECTOMY, ADENOIDECTOMY, BILATERAL MYRINGOTOMY AND TUBES     Family History: family history includes COPD in her maternal grandfather; Cancer in her mother; Colon cancer in her maternal grandmother; Diabetes in her father; Hyperlipidemia in her father; Hypertension in her father. Social History:  reports that she has quit smoking. Her smoking use included cigars. She has never used smokeless tobacco. She reports previous alcohol use. She reports that she does not use drugs.     Maternal Diabetes: No Genetic Screening: Normal Maternal Ultrasounds/Referrals:  Normal Fetal Ultrasounds or other Referrals:  None Maternal Substance Abuse:  No Significant Maternal Medications:  None Significant Maternal Lab Results:  None Other Comments:  None  Review of Systems Maternal Medical History:  Reason for admission: Contractions.   Contractions: Onset was 6-12 hours ago.    Prenatal complications: no prenatal complications Prenatal Complications - Diabetes: none.      Blood pressure 121/76, pulse 77, temperature 99 F (37.2 C), temperature source Oral, resp. rate 18, SpO2 100 %. Maternal Exam:  Abdomen: Patient reports no abdominal tenderness. Fundal height is c/w dates.    Introitus: Normal vulva.   Physical Exam Constitutional:      Appearance: She is well-developed.  HENT:     Head: Normocephalic and atraumatic.  Pulmonary:     Effort: Pulmonary effort is normal.  Abdominal:     Palpations: Abdomen is soft.  Genitourinary:    General: Normal vulva.  Skin:    General: Skin is warm and dry.  Neurological:     General: No focal deficit present.     Mental Status: She is alert.  Psychiatric:        Mood and Affect: Mood normal.  Behavior: Behavior normal.     Pelvic: scan blood on pad; clamped cord noted.  Vaginal sweep reveals intrauterine placenta.   Prenatal labs: ABO, Rh: O pos Antibody: Negative Rubella:  Immune RPR:   NR HBsAg:   Negative HIV:   NR GBS:   unk  Assessment/Plan: 35yo [id number] with incomplete abortion -800 mcg buccal cytotec given at 0945.  -Patient requests pain medication.  Fentanyl 50 mcg ordered and prior to administering, patient was consented for Suction D&C for retained placenta should that be required.  She is informed of the risk of bleeding, infection, scarring, and damage to surrounding structures.  All questions were answered.  Fentanyl was given. -Closely monitor for increased vaginal bleeding in which case, we would proceed to OR for Suction D&C.  [name] 01/20/2021, 10:44  AM

## 2021-01-20 NOTE — MAU Provider Note (Signed)
History   Patient Candice Kane is a 35 y.o. G3P1010  at [redacted]w[redacted]d who arrived by EMS this morning at 9:20. She is endorsing on/off again cramping and now feels like "Something is coming out". She does not know what it is.  She endorses some spotting, she endorses LLQ pain, she denies any complications in this pregnancy or c/sections in the past. She says she has had some watery/brownish discharge over the past few days but reports that it was not concerning to her. She denies odor to her discharge.   She has no history of DM, GDM,   CSN: [id number]  Arrival date and time: 01/20/21 4008   Event Date/Time   First Provider Initiated Contact with Patient 01/20/21 [phone]      Chief Complaint  Patient presents with  . Abdominal Pain   Abdominal Pain This is a new problem. The onset quality is sudden. The problem occurs constantly. The problem has been unchanged. The pain is located in the LLQ. The pain is at a severity of 7/10. The quality of the pain is cramping. The abdominal pain does not radiate. Nothing aggravates the pain. The pain is relieved by nothing.    OB History     Gravida  3   Para  1   Term  1   Preterm      AB  1   Living         SAB  1   IAB      Ectopic      Multiple      Live Births              Past Medical History:  Diagnosis Date  . Anemia   . Asthma   . Bronchitis    recent occurance  . Chest pain   . COPD (chronic obstructive pulmonary disease) ([place])   . Cough   . Goiter April 2014  . History of chlamydia 2011  . Lump of skin of back    Lower Right side - Lipoma  . Varicose veins     Past Surgical History:  Procedure Laterality Date  . TONSILECTOMY, ADENOIDECTOMY, BILATERAL MYRINGOTOMY AND TUBES      Family History  Problem Relation Age of Onset  . Cancer Mother        breast  . Diabetes Father   . Hyperlipidemia Father   . Hypertension Father   . Colon cancer Maternal Grandmother        colon  . COPD Maternal Grandfather      Social History   Tobacco Use  . Smoking status: Former Smoker    Types: Cigars  . Smokeless tobacco: Never Used  Vaping Use  . Vaping Use: Never used  Substance Use Topics  . Alcohol use: Not Currently    Comment: rare  . Drug use: No    Allergies: No Known Allergies  Medications Prior to Admission  Medication Sig Dispense Refill Last Dose  . acetaminophen (TYLENOL) 500 MG tablet Take 2 tablets (1,000 mg total) by mouth every 8 (eight) hours as needed for moderate pain. 30 tablet 0   . cyclobenzaprine (FLEXERIL) 10 MG tablet Take 1 tablet (10 mg total) by mouth 2 (two) times daily as needed for muscle spasms. 20 tablet 0   . hydrOXYzine (ATARAX/VISTARIL) 25 MG tablet Take 1 tablet (25 mg total) by mouth every 6 (six) hours. 12 tablet 0   . ibuprofen (ADVIL) 800 MG tablet Take 1 tablet (800 mg total)  by mouth 3 (three) times daily. 21 tablet 0   . Multiple Vitamins-Minerals (MULTIVITAMIN WITH MINERALS) tablet Take 1 tablet by mouth daily.     . Pseudoephedrine-APAP-DM (DAYQUIL PO) Take by mouth.     . Pseudoephedrine-Ibuprofen (ADVIL COLD/SINUS PO) Take by mouth.       Review of Systems  Constitutional: Negative.   HENT: Negative.   Respiratory: Negative.   Cardiovascular: Negative.   Gastrointestinal: Positive for abdominal pain.  Genitourinary: Negative.   Musculoskeletal: Negative.   Neurological: Negative.    Physical Exam   Blood pressure 121/76, pulse 77, temperature 99 F (37.2 C), temperature source Oral, resp. rate 18, SpO2 100 %.  Physical Exam Constitutional:      Appearance: She is well-developed.  Cardiovascular:     Rate and Rhythm: Normal rate.  Neurological:     Mental Status: She is alert.     MAU Course  Procedures  MDM Upon entering room to examine patient leaned back in bed and fetus was noted between her legs (0930). Fetus's appearance is compressed with no movement, no color.  Explained to patient that she has had a miscarriage;  patient declined to see fetus at the moment. IV was started, admission labs drawn  Cord gently clamped and cut, fetus to pathology and Dr. Roselie Awkward notified. Will give 800 of cytotec buccally to help expel placenta.   Dr. Lynnette Caffey notified of patient's presence on the unit, Dr. Lynnette Caffey offered admission to patient and patient would like to be admitted for duration of third stage.  Assessment and Plan   Dr. Lynnette Caffey assumes care of patient at 1015 with plan for admission. Dr. Lynnette Caffey to place orders.  Mervyn Skeeters [place] 01/20/2021, 3:13 PM

## 2021-01-20 NOTE — Progress Notes (Signed)
Patient requests discharge home.  Will plan f/u in office in 2 weeks.    [name], DO

## 2021-01-20 NOTE — MAU Note (Signed)
Candice Kane is a 35 y.o. at [redacted]w[redacted]d here in MAU reporting: pt arrived via EMS complaining of brown watery discharge and left sided abdominal pain. Pt brought directly to room 129 due to registration stating the pt is saying her umbilical cord is hanging out. Claretha Cooper CNM at bedside. Pt having some bleeding and a little bit of pain.   Onset of complaint: yesterday  Pain score: 3/10    FHT: none  Lab orders placed from triage: none

## 2021-01-20 NOTE — Progress Notes (Signed)
Discharge instructions given to patient. Medications reviewed. Care reviewed. Patient to receive call from Dr. Lynnette Caffey' office for FU appointment.

## 2021-01-20 NOTE — Progress Notes (Signed)
In to see patient.  She was just up to void.  Reports scant vaginal bleeding but still cramping.  Gen: A&O x 3 Abd: soft, NT Pelvic: scant blood on pad which has been in place about 2 hours Ext: no c/c/e  Patient reports not feeling ready for d/c at this time.  Will order Ibuprofen scheduled and reassess in a few hours.    [name], DO

## 2021-01-23 LAB — SURGICAL PATHOLOGY

## 2021-01-25 ENCOUNTER — Other Ambulatory Visit: Payer: Self-pay | Admitting: Obstetrics & Gynecology

## 2021-01-25 DIAGNOSIS — O034 Incomplete spontaneous abortion without complication: Secondary | ICD-10-CM

## 2021-06-13 DIAGNOSIS — D171 Benign lipomatous neoplasm of skin and subcutaneous tissue of trunk: Secondary | ICD-10-CM | POA: Diagnosis not present

## 2021-06-13 DIAGNOSIS — I83893 Varicose 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 complications: Secondary | ICD-10-CM | POA: Diagnosis not present

## 2021-06-13 DIAGNOSIS — Z3009 Encounter for other general counseling and advice on contraception: Secondary | ICD-10-CM | POA: Diagnosis not present

## 2021-07-12 DIAGNOSIS — D171 Benign lipomatous neoplasm of skin and subcutaneous tissue of trunk: Secondary | ICD-10-CM | POA: Diagnosis not present

## 2021-08-14 DIAGNOSIS — D171 Benign lipomatous neoplasm of skin and subcutaneous tissue of trunk: Secondary | ICD-10-CM | POA: Diagnosis not present

## 2021-08-14 HISTORY — PX: BACK SURGERY: SHX140

## 2021-11-15 DIAGNOSIS — Z3200 Encounter for pregnancy test, result unknown: Secondary | ICD-10-CM | POA: Diagnosis not present

## 2021-11-26 ENCOUNTER — Ambulatory Visit (INDEPENDENT_AMBULATORY_CARE_PROVIDER_SITE_OTHER): Payer: Medicaid Other

## 2021-11-26 ENCOUNTER — Other Ambulatory Visit: Payer: Self-pay

## 2021-11-26 VITALS — BP 116/78 | HR 77 | Ht 73.0 in | Wt 304.4 lb

## 2021-11-26 DIAGNOSIS — O3680X0 Pregnancy with inconclusive fetal viability, not applicable or unspecified: Secondary | ICD-10-CM

## 2021-11-26 DIAGNOSIS — Z3490 Encounter for supervision of normal pregnancy, unspecified, unspecified trimester: Secondary | ICD-10-CM | POA: Insufficient documentation

## 2021-11-26 DIAGNOSIS — Z3A09 9 weeks gestation of pregnancy: Secondary | ICD-10-CM

## 2021-11-26 DIAGNOSIS — Z3481 Encounter for supervision of other normal pregnancy, first trimester: Secondary | ICD-10-CM

## 2021-11-26 MED ORDER — BLOOD PRESSURE KIT DEVI: 1.0000 | 0 refills | Status: DC

## 2021-11-26 MED ORDER — GOJJI WEIGHT SCALE MISC: 1.0000 | 0 refills | Status: DC

## 2021-11-26 MED ORDER — DOXYLAMINE-PYRIDOXINE 10-10 MG PO TBEC: 2.0000 | DELAYED_RELEASE_TABLET | Freq: Every day | ORAL | 5 refills | Status: DC

## 2021-11-26 NOTE — Progress Notes (Signed)
Patient seen and assessed by nursing staff.  Agree with documentation and plan.  

## 2021-11-26 NOTE — Progress Notes (Signed)
New OB Intake  I connected with  Candice Kane on 11/26/21 at  8:15 AM EST by in person  and verified that I am speaking with the correct person using two identifiers. Nurse is located at [facility] and pt is located at [place].  I discussed the limitations, risks, security and privacy concerns of performing an evaluation and management service by telephone and the availability of in person appointments. I also discussed with the patient that there may be a patient responsible charge related to this service. The patient expressed understanding and agreed to proceed.  I explained I am completing New OB Intake today. We discussed her EDD of 06/30/22 that is based on LMP of 09/23/21. Pt is G4/P1021. I reviewed her allergies, medications, Medical/Surgical/OB history, and appropriate screenings. I informed her of [facility] services. Based on history, this is a/an  pregnancy uncomplicated .   Patient Active Problem List   Diagnosis Date Noted   Retained products of conception after delivery without hemorrhage 01/20/2021   Varicose veins of lower extremities with other complications 87/56/4332   Goiter 03/10/2013   Lipoma of back 03/10/2013    Concerns addressed today  Delivery Plans:  Plans to deliver at [facility].   MyChart/Babyscripts MyChart access verified. I explained pt will have some visits in office and some virtually. Babyscripts instructions given and order placed. Patient verifies receipt of registration text/e-mail. Account successfully created and app downloaded.  Blood Pressure Cuff  Blood pressure cuff ordered for patient to pick-up from [employer]. Explained after first prenatal appt pt will check weekly and document in 38.  Weight scale: Patient does not have weight scale. Weight scale ordered for patient to pick up form [facility].   Anatomy US Explained first scheduled [place] will be around 19 weeks. Dating and viaibility scan performed today  Labs Discussed Johnsie Cancel  genetic screening with patient. Would like both Panorama and Horizon drawn at new OB visit. Routine prenatal labs needed.  Covid Vaccine Patient has not covid vaccine.    Informed patient of [facility] website  and placed link in her AVS.   Social Determinants of Health Food Insecurity: Patient denies food insecurity. WIC Referral: Patient is interested in referral to [facility].  Transportation: Patient denies transportation needs. Childcare: Discussed no children allowed at ultrasound appointments. Offered childcare services; patient declines childcare services at this time.  Send link to Pregnancy Navigators   Placed OB Box on problem list and updated  First visit review I reviewed new OB appt with pt. I explained she will have a pelvic exam, ob bloodwork with genetic screening, and PAP smear. Explained pt will be seen by Baltazar Najjar at first visit; encounter routed to appropriate provider. Explained that patient will be seen by pregnancy navigator following visit with provider. [facility] information placed in AVS.   Lucianne Lei, RN 11/26/2021  8:38 AM

## 2021-12-01 NOTE — L&D Delivery Note (Signed)
OB/GYN Faculty Practice Delivery Note  Candice Kane is a 36 y.o. [id number] s/p VD at [redacted]w[redacted]d She was admitted for IOL due to gestational hypertension.   ROM: 3h 234mith clear fluid GBS Status: Negative/-- (07/06 1650) Maximum Maternal Temperature: 98.4F  Labor Progress: Initial SVE: 1.5/50/-3. She then progressed to complete with assistance of cytotec, pit, and AROM.   Delivery Date/Time: 1534 Delivery: Called to room and patient was complete and pushing. She pushed for 3 contractions total. Head delivered midline OA. No nuchal cord present, but wrapped around an arm. Shoulder and body delivered in usual fashion. Infant with spontaneous cry, placed on mother's abdomen, dried and stimulated. Cord clamped x 2 after 1-minute delay, and cut by patient support person. Cord blood drawn. Placenta delivered spontaneously with gentle cord traction. Fundus firm with massage and Pitocin. Labia, perineum, vagina, and cervix inspected with a small left peri-urethral that was overall hemostatic and not repaired.  Baby Weight: 3125g  Placenta: 3 vessel, intact. Sent to L&D Complications: None Lacerations: Left peri-urethral, not repaired  EBL: 82 mL Analgesia: Epidural   Infant:  APGAR (1 MIN): 9  APGAR (5 MINS): 9 Kingston SpringsDO  OB Family Medicine Fellow, [facility] 06/15/2022, 3:54 PM

## 2021-12-17 ENCOUNTER — Other Ambulatory Visit (HOSPITAL_COMMUNITY)
Admission: RE | Admit: 2021-12-17 | Discharge: 2021-12-17 | Disposition: A | Discharge status: Home / Self Care | Payer: Medicaid Other | Source: Ambulatory Visit | Attending: Obstetrics | Admitting: Obstetrics

## 2021-12-17 ENCOUNTER — Ambulatory Visit: Payer: Medicaid Other

## 2021-12-17 ENCOUNTER — Encounter: Payer: Self-pay | Admitting: Obstetrics

## 2021-12-17 ENCOUNTER — Other Ambulatory Visit: Payer: Self-pay

## 2021-12-17 ENCOUNTER — Ambulatory Visit (INDEPENDENT_AMBULATORY_CARE_PROVIDER_SITE_OTHER): Payer: Medicaid Other | Admitting: Obstetrics

## 2021-12-17 VITALS — BP 114/77 | HR 92 | Wt 310.0 lb

## 2021-12-17 DIAGNOSIS — Z3A12 12 weeks gestation of pregnancy: Secondary | ICD-10-CM | POA: Diagnosis not present

## 2021-12-17 DIAGNOSIS — Z3481 Encounter for supervision of other normal pregnancy, first trimester: Secondary | ICD-10-CM | POA: Insufficient documentation

## 2021-12-17 DIAGNOSIS — O09529 Supervision of elderly multigravida, unspecified trimester: Secondary | ICD-10-CM

## 2021-12-17 DIAGNOSIS — O99210 Obesity complicating pregnancy, unspecified trimester: Secondary | ICD-10-CM

## 2021-12-17 DIAGNOSIS — Z31430 Encounter of female for testing for genetic disease carrier status for procreative management: Secondary | ICD-10-CM | POA: Diagnosis not present

## 2021-12-17 DIAGNOSIS — O368390 Maternal care for abnormalities of the fetal heart rate or rhythm, unspecified trimester, not applicable or unspecified: Secondary | ICD-10-CM

## 2021-12-17 NOTE — Progress Notes (Signed)
Subjective:    Candice Kane is being seen today for her first obstetrical visit.  This is a planned pregnancy. She is at [redacted]w[redacted]d gestation. Her obstetrical history is significant for advanced maternal age and obesity. Relationship with FOB: significant other, not living together, but supportive. Patient does intend to breast feed. Pregnancy history fully reviewed.  The information documented in the HPI was reviewed and verified.  Menstrual History: OB History     Gravida  4   Para  1   Term  1   Preterm      AB  2   Living  1      SAB  1   IAB      Ectopic      Multiple      Live Births  1            Patient's last menstrual period was 09/23/2021.    Past Medical History:  Diagnosis Date   Anemia    Asthma    Bronchitis    recent occurance   Chest pain    COPD (chronic obstructive pulmonary disease) ([place])    Cough    Goiter April 2014   History of chlamydia 2011   Lump of skin of back    Lower Right side - Lipoma   Varicose veins     Past Surgical History:  Procedure Laterality Date   BACK SURGERY  08/14/2021   TONSILECTOMY, ADENOIDECTOMY, BILATERAL MYRINGOTOMY AND TUBES      (Not in a hospital admission)  No Known Allergies  Social History   Tobacco Use   Smoking status: Former    Types: Cigars   Smokeless tobacco: Never  Substance Use Topics   Alcohol use: Not Currently    Comment: rare, not since confirmed pregnancy    Family History  Problem Relation Age of Onset   Cancer Mother        breast   Kidney failure Mother    Diabetes Father    Hyperlipidemia Father    Hypertension Father    Colon cancer Maternal Grandmother        colon   COPD Maternal Grandfather      Review of Systems Constitutional: negative for weight loss Gastrointestinal: negative for vomiting Genitourinary:negative for genital lesions and vaginal discharge and dysuria Musculoskeletal:negative for back pain Behavioral/Psych: negative for abusive  relationship, depression, illegal drug usage and tobacco use    Objective:    BP 114/77    Pulse 92    Wt (!) 310 lb (140.6 kg)    LMP 09/23/2021    BMI 40.90 kg/m  General Appearance:    Alert, cooperative, no distress, appears stated age  Head:    Normocephalic, without obvious abnormality, atraumatic  Eyes:    PERRL, conjunctiva/corneas clear, EOM's intact, fundi    benign, both eyes  Ears:    Normal TM's and external ear canals, both ears  Nose:   Nares normal, septum midline, mucosa normal, no drainage    or sinus tenderness  Throat:   Lips, mucosa, and tongue normal; teeth and gums normal  Neck:   Supple, symmetrical, trachea midline, no adenopathy;    thyroid:  no enlargement/tenderness/nodules; no carotid   bruit or JVD  Back:     Symmetric, no curvature, ROM normal, no CVA tenderness  Lungs:     Clear to auscultation bilaterally, respirations unlabored  Chest Wall:    No tenderness or deformity   Heart:    Regular rate  and rhythm, S1 and S2 normal, no murmur, rub   or gallop  Breast Exam:    No tenderness, masses, or nipple abnormality  Abdomen:     Soft, non-tender, bowel sounds active all four quadrants,    no masses, no organomegaly  Genitalia:    Normal female without lesion, discharge or tenderness  Extremities:   Extremities normal, atraumatic, no cyanosis or edema  Pulses:   2+ and symmetric all extremities  Skin:   Skin color, texture, turgor normal, no rashes or lesions  Lymph nodes:   Cervical, supraclavicular, and axillary nodes normal  Neurologic:   CNII-XII intact, normal strength, sensation and reflexes    throughout      Lab Review Urine pregnancy test Labs reviewed yes Radiologic studies reviewed yes  Assessment:    Pregnancy at [redacted]w[redacted]d weeks    Plan:    1. Supervision of elderly multigravida, antepartum Rx: - Cytology - PAP( [place]) - Cervicovaginal ancillary only( [place]) - Obstetric Panel, Including HIV - Hepatitis C antibody -  Culture, OB Urine - Genetic Screening  2. Obesity affecting pregnancy, antepartum  3. Unable to hear fetal heart tones as reason for ultrasound scan Rx: - US OB Limited; Future   Prenatal vitamins.  Counseling provided regarding continued use of seat belts, cessation of alcohol consumption, smoking or use of illicit drugs; infection precautions i.e., influenza/TDAP immunizations, toxoplasmosis,CMV, parvovirus, listeria and varicella; workplace safety, exercise during pregnancy; routine dental care, safe medications, sexual activity, hot tubs, saunas, pools, travel, caffeine use, fish and methlymercury, potential toxins, hair treatments, varicose veins Weight gain recommendations per IOM guidelines reviewed: underweight/BMI< 18.5--> gain 28 - 40 lbs; normal weight/BMI 18.5 - 24.9--> gain 25 - 35 lbs; overweight/BMI 25 - 29.9--> gain 15 - 25 lbs; obese/BMI >30->gain  11 - 20 lbs Problem list reviewed and updated. FIRST/CF mutation testing/NIPT/QUAD SCREEN/fragile X/Ashkenazi Jewish population testing/Spinal muscular atrophy discussed: requested. Role of ultrasound in pregnancy discussed; fetal survey: requested. Amniocentesis discussed: not indicated.   Orders Placed This Encounter  Procedures   Culture, OB Urine   US OB Limited    Standing Status:   Future    Number of Occurrences:   1    Standing Expiration Date:   12/17/2022    Order Specific Question:   Reason for Exam (SYMPTOM  OR DIAGNOSIS REQUIRED)    Answer:   unable to hear heart tones with doppler    Order Specific Question:   Preferred Imaging Location?    Answer:   External   Obstetric Panel, Including HIV   Hepatitis C antibody   Genetic Screening   Hemoglobin A1c    Follow up in 4 weeks.  I have spent a total of 30 minutes of face-to-face time, excluding clinical staff time, reviewing notes and preparing to see patient, ordering tests and/or medications, and counseling the patient.   Shelly Bombard, MD,  [place] Attending [place], [facility] for [facility], [place], [place] 12/17/2021

## 2021-12-18 LAB — CERVICOVAGINAL ANCILLARY ONLY
Bacterial Vaginitis (gardnerella): POSITIVE — AB
Candida Glabrata: NEGATIVE
Candida Vaginitis: NEGATIVE
Chlamydia: NEGATIVE
Comment: NEGATIVE
Comment: NEGATIVE
Comment: NEGATIVE
Comment: NEGATIVE
Comment: NEGATIVE
Comment: NORMAL
Neisseria Gonorrhea: NEGATIVE
Trichomonas: NEGATIVE

## 2021-12-18 LAB — HEMOGLOBIN A1C
Est. average glucose Bld gHb Est-mCnc: 143 mg/dL
Hgb A1c MFr Bld: 6.6 % — ABNORMAL HIGH (ref 4.8–5.6)

## 2021-12-18 LAB — SPECIMEN STATUS REPORT

## 2021-12-19 ENCOUNTER — Other Ambulatory Visit: Payer: Self-pay | Admitting: Obstetrics

## 2021-12-19 ENCOUNTER — Other Ambulatory Visit: Payer: Self-pay

## 2021-12-19 DIAGNOSIS — B9689 Other specified bacterial agents as the cause of diseases classified elsewhere: Secondary | ICD-10-CM

## 2021-12-19 DIAGNOSIS — O24419 Gestational diabetes mellitus in pregnancy, unspecified control: Secondary | ICD-10-CM

## 2021-12-19 DIAGNOSIS — N760 Acute vaginitis: Secondary | ICD-10-CM

## 2021-12-19 DIAGNOSIS — D509 Iron deficiency anemia, unspecified: Secondary | ICD-10-CM

## 2021-12-19 LAB — OBSTETRIC PANEL, INCLUDING HIV
Antibody Screen: NEGATIVE
Basophils Absolute: 0.1 10*3/uL (ref 0.0–0.2)
Basos: 1 %
EOS (ABSOLUTE): 0.1 10*3/uL (ref 0.0–0.4)
Eos: 1 %
HIV Screen 4th Generation wRfx: NONREACTIVE
Hematocrit: 31.1 % — ABNORMAL LOW (ref 34.0–46.6)
Hemoglobin: 10.1 g/dL — ABNORMAL LOW (ref 11.1–15.9)
Hepatitis B Surface Ag: NEGATIVE
Immature Grans (Abs): 0 10*3/uL (ref 0.0–0.1)
Immature Granulocytes: 0 %
Lymphocytes Absolute: 3 10*3/uL (ref 0.7–3.1)
Lymphs: 27 %
MCH: 25.1 pg — ABNORMAL LOW (ref 26.6–33.0)
MCHC: 32.5 g/dL (ref 31.5–35.7)
MCV: 77 fL — ABNORMAL LOW (ref 79–97)
Monocytes Absolute: 0.7 10*3/uL (ref 0.1–0.9)
Monocytes: 6 %
Neutrophils Absolute: 7.2 10*3/uL — ABNORMAL HIGH (ref 1.4–7.0)
Neutrophils: 65 %
Platelets: 484 10*3/uL — ABNORMAL HIGH (ref 150–450)
RBC: 4.02 x10E6/uL (ref 3.77–5.28)
RDW: 16.9 % — ABNORMAL HIGH (ref 11.7–15.4)
RPR Ser Ql: NONREACTIVE
Rh Factor: POSITIVE
Rubella Antibodies, IGG: 0.97 index — ABNORMAL LOW (ref >0.99)
WBC: 11.1 10*3/uL — ABNORMAL HIGH (ref 3.4–10.8)

## 2021-12-19 LAB — CULTURE, OB URINE

## 2021-12-19 LAB — URINE CULTURE, OB REFLEX

## 2021-12-19 LAB — HEPATITIS C ANTIBODY: Hep C Virus Ab: <0.1 s/co ratio (ref 0.0–0.9)

## 2021-12-19 LAB — CYTOLOGY - PAP
Comment: NEGATIVE
Diagnosis: NEGATIVE
High risk HPV: NEGATIVE

## 2021-12-19 MED ORDER — METRONIDAZOLE 500 MG PO TABS: 500.0000 mg | ORAL_TABLET | Freq: Two times a day (BID) | ORAL | 2 refills | Status: DC

## 2021-12-19 MED ORDER — ACCU-CHEK SOFTCLIX LANCETS MISC: 12 refills | Status: DC

## 2021-12-19 MED ORDER — IRON POLYSACCH CMPLX-B12-FA 150-0.025-1 MG PO CAPS: 1.0000 | ORAL_CAPSULE | ORAL | 5 refills | Status: DC

## 2021-12-19 MED ORDER — ACCU-CHEK GUIDE VI STRP: ORAL_STRIP | Status: DC

## 2021-12-19 MED ORDER — ACCU-CHEK GUIDE VI STRP: ORAL_STRIP | 12 refills | Status: DC

## 2021-12-19 MED ORDER — ACCU-CHEK GUIDE W/DEVICE KIT
PACK | 0 refills | Status: DC
Start: 2021-12-19 — End: 2022-06-16

## 2021-12-30 ENCOUNTER — Other Ambulatory Visit: Payer: Self-pay

## 2021-12-30 ENCOUNTER — Encounter: Payer: Self-pay | Admitting: Obstetrics

## 2021-12-30 DIAGNOSIS — D563 Thalassemia minor: Secondary | ICD-10-CM

## 2022-01-14 ENCOUNTER — Encounter: Payer: Medicaid Other | Admitting: Obstetrics and Gynecology

## 2022-01-14 ENCOUNTER — Encounter: Payer: Self-pay | Admitting: Obstetrics

## 2022-01-23 ENCOUNTER — Ambulatory Visit (INDEPENDENT_AMBULATORY_CARE_PROVIDER_SITE_OTHER): Payer: Medicaid Other | Admitting: Obstetrics and Gynecology

## 2022-01-23 ENCOUNTER — Other Ambulatory Visit: Payer: Self-pay

## 2022-01-23 VITALS — BP 118/77 | HR 99 | Wt 309.8 lb

## 2022-01-23 DIAGNOSIS — N898 Other specified noninflammatory disorders of vagina: Secondary | ICD-10-CM

## 2022-01-23 DIAGNOSIS — O26892 Other specified pregnancy related conditions, second trimester: Secondary | ICD-10-CM

## 2022-01-23 DIAGNOSIS — Z3A17 17 weeks gestation of pregnancy: Secondary | ICD-10-CM

## 2022-01-23 DIAGNOSIS — Z3482 Encounter for supervision of other normal pregnancy, second trimester: Secondary | ICD-10-CM

## 2022-01-23 MED ORDER — TERCONAZOLE 0.4 % VA CREA: 1.0000 | TOPICAL_CREAM | Freq: Every day | VAGINAL | 0 refills | Status: DC

## 2022-01-23 MED ORDER — ASPIRIN 81 MG PO CHEW: 81.0000 mg | CHEWABLE_TABLET | Freq: Every day | ORAL | 7 refills | Status: DC

## 2022-01-23 NOTE — Progress Notes (Signed)
Pt reports feeling light flutter movements, with occasional pressure. Pt reports that she is taking amoxicillin prescribed by urgent care, and she is now having vaginal itching.

## 2022-01-23 NOTE — Progress Notes (Signed)
° °  PRENATAL VISIT NOTE  Subjective:  Candice Kane is a 36 y.o. G4P1021 at [redacted]w[redacted]d being seen today for ongoing prenatal care.  She is currently monitored for the following issues for this high-risk pregnancy and has Varicose veins of lower extremities with other complications; Goiter; Lipoma of back; Retained products of conception after delivery without hemorrhage; and Supervision of normal pregnancy on their problem list.  Patient doing well with no acute concerns today. She reports  occasional pelvic pressure .  Contractions: Not present. Vag. Bleeding: None.  Movement: Present. Denies leaking of fluid.   The following portions of the patient's history were reviewed and updated as appropriate: allergies, current medications, past family history, past medical history, past social history, past surgical history and problem list. Problem list updated.  Objective:   Vitals:   01/23/22 1619  BP: 118/77  Pulse: 99  Weight: (!) 309 lb 12.8 oz (140.5 kg)    Fetal Status: Fetal Heart Rate (bpm): 159 Fundal Height: 17 cm Movement: Present     General:  Alert, oriented and cooperative. Patient is in no acute distress.  Skin: Skin is warm and dry. No rash noted.   Cardiovascular: Normal heart rate noted  Respiratory: Normal respiratory effort, no problems with respiration noted  Abdomen: Soft, gravid, appropriate for gestational age.  Pain/Pressure: Present     Pelvic: Cervical exam deferred        Extremities: Normal range of motion.     Mental Status:  Normal mood and affect. Normal behavior. Normal judgment and thought content.   Assessment and Plan:  Pregnancy: G4P1021 at [redacted]w[redacted]d  1. [redacted] weeks gestation of pregnancy   2. Encounter for supervision of other normal pregnancy in second trimester Pt's A1c was 6.6, She is scheduled for diabetic teaching in 1 week Pt has risk factors for preeclampsia, will start baby ASA - AFP, Serum, Open Spina Bifida  3. Vaginal discharge during pregnancy  in second trimester  - terconazole (TERAZOL 7) 0.4 % vaginal cream; Place 1 applicator vaginally at bedtime.  Dispense: 45 g; Refill: 0  Preterm labor symptoms and general obstetric precautions including but not limited to vaginal bleeding, contractions, leaking of fluid and fetal movement were reviewed in detail with the patient.  Please refer to After Visit Summary for other counseling recommendations.   Return in about 3 weeks (around 02/13/2022) for ROB, in person.   Lynnda Shields, MD Faculty Attending Center for [facility]

## 2022-01-25 LAB — AFP, SERUM, OPEN SPINA BIFIDA
AFP MoM: 1.68
AFP Value: 48.9 ng/mL
Gest. Age on Collection Date: 17 weeks
Maternal Age At EDD: 35.9 yr
OSBR Risk 1 IN: 3427
Test Results:: NEGATIVE
Weight: 309 [lb_av]

## 2022-01-28 ENCOUNTER — Encounter: Payer: Medicaid Other | Attending: Obstetrics | Admitting: Registered"

## 2022-01-28 ENCOUNTER — Encounter: Payer: Self-pay | Admitting: Family Medicine

## 2022-01-28 ENCOUNTER — Other Ambulatory Visit: Payer: Self-pay

## 2022-01-28 ENCOUNTER — Ambulatory Visit (INDEPENDENT_AMBULATORY_CARE_PROVIDER_SITE_OTHER): Payer: Medicaid Other | Admitting: Registered"

## 2022-01-28 DIAGNOSIS — O09529 Supervision of elderly multigravida, unspecified trimester: Secondary | ICD-10-CM | POA: Diagnosis not present

## 2022-01-28 DIAGNOSIS — O24419 Gestational diabetes mellitus in pregnancy, unspecified control: Secondary | ICD-10-CM | POA: Diagnosis present

## 2022-01-28 DIAGNOSIS — Z3A00 Weeks of gestation of pregnancy not specified: Secondary | ICD-10-CM | POA: Insufficient documentation

## 2022-01-28 DIAGNOSIS — Z713 Dietary counseling and surveillance: Secondary | ICD-10-CM | POA: Diagnosis not present

## 2022-01-28 DIAGNOSIS — O24912 Unspecified diabetes mellitus in pregnancy, second trimester: Secondary | ICD-10-CM

## 2022-01-28 NOTE — Progress Notes (Signed)
Patient was seen for Gestational Diabetes self-management on 01/28/22  Start time 5625 and End time 1015   Estimated due date: 06/30/22; [redacted]w[redacted]d  Clinical: Medications: reviewed Medical History: reviewed Labs: OGTT n/a, A1c 6.6%   Due to high A1c lab value, OGTT was not performed. In addition to basic GDM education, discussed A1c lab and potential diagnosis of T2D postpartum. Also states it is likely she will need insulin in 3rd trimester. Discussed insulin delivery options including pens, vials and Omnipod pump. Also introduced basics of CGM  Dietary and Lifestyle History: Patient states she was active before pregnancy but has been very tired with this pregnancy and not motivated to walk after work.  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 Pt states she often skips lunch and may just have something small to snack on.  Pt states prior to diagnosis she was drinking a can of Mtn Dew daily, now cut back to every other day.  Physical Activity: ADL Stress: not assessed Sleep: 9p-5:63m restless has a hard time getting comfortable  24 hr Recall:  First Meal: omelette with cheese, peppers, onions, water Snack: none or fruit Second meal: pretzels, or peanuts or popcorn Snack: Third meal: burger and fries Snack: small bowl of cereal 3x/week Beverages: water, can mtn dew at work every other day  [place] education (E-1) on the following topics:   Initial Follow-up  [x]  []  Definition of Gestational Diabetes []  []  Why dietary management is important in controlling blood glucose [x]  []  Effects each nutrient has on blood glucose levels []  []  Simple carbohydrates vs complex carbohydrates []  []  Fluid intake []  []  Creating a balanced meal plan [x]  []  Carbohydrate counting  []  []  When to check blood glucose levels []  []  Proper blood glucose monitoring techniques [x]  []  Effect  of stress and stress reduction techniques  [x]  []  Exercise effect on blood glucose levels, appropriate exercise during pregnancy []  []  Importance of limiting caffeine and abstaining from alcohol and smoking [x]  []  Medications used for blood sugar control during pregnancy []  []  Hypoglycemia and rule of 15 []  []  Postpartum self care  Patient brought meter to visit for instruction on how to use. Postprandial: 124 mg/dL ~2 hrs after protein only breakfast  Patient instructed to monitor glucose levels: FBS: 60 - ? 95 mg/dL (some clinics use 90 for cutoff) 1 hour: ? 140 mg/dL 2 hour: ? 120 mg/dL  Patient received handouts: Nutrition Diabetes and Pregnancy Carbohydrate Counting List  Patient will be seen for follow-up as needed.

## 2022-01-29 ENCOUNTER — Telehealth: Payer: Self-pay

## 2022-01-29 NOTE — Telephone Encounter (Signed)
Left message for patient to call office to reschedule Genetic Counseling as a WebEx ?

## 2022-02-03 ENCOUNTER — Ambulatory Visit: Payer: Medicaid Other | Admitting: *Deleted

## 2022-02-03 ENCOUNTER — Other Ambulatory Visit: Payer: Self-pay

## 2022-02-03 ENCOUNTER — Ambulatory Visit: Payer: Medicaid Other

## 2022-02-03 ENCOUNTER — Encounter: Payer: Self-pay | Admitting: *Deleted

## 2022-02-03 ENCOUNTER — Ambulatory Visit (HOSPITAL_BASED_OUTPATIENT_CLINIC_OR_DEPARTMENT_OTHER): Payer: Medicaid Other | Admitting: Obstetrics

## 2022-02-03 ENCOUNTER — Encounter: Payer: Self-pay | Admitting: Genetics

## 2022-02-03 ENCOUNTER — Ambulatory Visit: Payer: Medicaid Other | Attending: Obstetrics

## 2022-02-03 ENCOUNTER — Other Ambulatory Visit: Payer: Self-pay | Admitting: *Deleted

## 2022-02-03 VITALS — BP 118/67 | HR 94

## 2022-02-03 DIAGNOSIS — Z3A19 19 weeks gestation of pregnancy: Secondary | ICD-10-CM | POA: Diagnosis not present

## 2022-02-03 DIAGNOSIS — Z362 Encounter for other antenatal screening follow-up: Secondary | ICD-10-CM

## 2022-02-03 DIAGNOSIS — Z363 Encounter for antenatal screening for malformations: Secondary | ICD-10-CM | POA: Insufficient documentation

## 2022-02-03 DIAGNOSIS — O99212 Obesity complicating pregnancy, second trimester: Secondary | ICD-10-CM | POA: Diagnosis not present

## 2022-02-03 DIAGNOSIS — Z3482 Encounter for supervision of other normal pregnancy, second trimester: Secondary | ICD-10-CM

## 2022-02-03 DIAGNOSIS — E669 Obesity, unspecified: Secondary | ICD-10-CM | POA: Diagnosis not present

## 2022-02-03 DIAGNOSIS — O24410 Gestational diabetes mellitus in pregnancy, diet controlled: Secondary | ICD-10-CM

## 2022-02-03 DIAGNOSIS — O09522 Supervision of elderly multigravida, second trimester: Secondary | ICD-10-CM

## 2022-02-03 DIAGNOSIS — Z3A18 18 weeks gestation of pregnancy: Secondary | ICD-10-CM | POA: Diagnosis not present

## 2022-02-03 DIAGNOSIS — D563 Thalassemia minor: Secondary | ICD-10-CM

## 2022-02-03 NOTE — Progress Notes (Signed)
MFM Note ? ?Candice Kane was seen for a detailed fetal anatomy scan due to maternal obesity and recently diagnosed diet-controlled gestational diabetes.  She has screened positive as a carrier for the alpha thalassemia trait.  She is already scheduled to meet with our genetic counselor in 2 days regarding this screening test.   ? ?She has been monitoring her fingerstick values and reports that some of her fasting values are elevated. ? ?She had a cell free DNA test earlier in her pregnancy which indicated a low risk for trisomy 77, 4, and 13. A female fetus is predicted.  ? ?She was informed that the fetal growth and amniotic fluid level were appropriate for her gestational age.  ? ?The views of the fetal anatomy were limited today due to the fetal position and maternal body habitus. ? ?The patient was informed that anomalies may be missed due to technical limitations. If the fetus is in a suboptimal position or maternal habitus is increased, visualization of the fetus in the maternal uterus may be impaired. ? ?The following were discussed today: ? ?Gestational diabetes ? ?The implications and management of diabetes in pregnancy was discussed in detail with the patient.   ? ?She was advised to continue to monitor her fingersticks 4 times daily (fasting and 2 hours after each meal).   ? ?She was advised that our goals for her fingerstick values are fasting values of 90-95 or less and two-hour postprandial values of 120 or less.   ? ?Should the majority of her fingerstick results be above these values, she may have to be started on metformin or insulin to help her achieve better glycemic control. The patient was advised that getting her fingerstick values as close to these goals as possible would provide her with the most optimal obstetrical outcome. ? ?Due to gestational diabetes, we will continue to follow her with monthly growth ultrasounds.   ? ?Weekly fetal testing will be recommended starting at 32 weeks should  she require insulin or metformin for treatment of gestational diabetes. ? ?The increased risk of polyhydramnios, fetal macrosomia, and preeclampsia associated with diabetes was also discussed.   ? ?The patient was advised that delivery for well-controlled diabetes in pregnancy is usually recommended at around 39 weeks.  Delivery at 37 weeks may be considered should her glycemic control be poor. ? ?The patient stated that all of her questions have been answered. ? ?We will continue to follow her closely with you throughout her pregnancy. ? ?A follow-up exam was scheduled in 4 weeks to complete the views of the fetal anatomy.   ? ?We will consider a referral to pediatric cardiology for a fetal echocardiogram should the fetal cardiac views be unable to be visualized during her next exam. ? ?A total of 30 minutes was spent counseling and coordinating the care for this patient.  Greater than 50% of the time was spent in direct face-to-face contact.  ?

## 2022-02-05 ENCOUNTER — Ambulatory Visit: Payer: Medicaid Other

## 2022-02-11 ENCOUNTER — Other Ambulatory Visit: Payer: Medicaid Other

## 2022-02-13 ENCOUNTER — Other Ambulatory Visit: Payer: Self-pay

## 2022-02-13 ENCOUNTER — Ambulatory Visit (INDEPENDENT_AMBULATORY_CARE_PROVIDER_SITE_OTHER): Payer: Medicaid Other | Admitting: Obstetrics and Gynecology

## 2022-02-13 VITALS — BP 121/75 | HR 97 | Wt 308.8 lb

## 2022-02-13 DIAGNOSIS — O24112 Pre-existing diabetes mellitus, type 2, in pregnancy, second trimester: Secondary | ICD-10-CM

## 2022-02-13 DIAGNOSIS — O09529 Supervision of elderly multigravida, unspecified trimester: Secondary | ICD-10-CM | POA: Insufficient documentation

## 2022-02-13 DIAGNOSIS — Z3482 Encounter for supervision of other normal pregnancy, second trimester: Secondary | ICD-10-CM

## 2022-02-13 DIAGNOSIS — Z3A20 20 weeks gestation of pregnancy: Secondary | ICD-10-CM

## 2022-02-13 DIAGNOSIS — O09522 Supervision of elderly multigravida, second trimester: Secondary | ICD-10-CM

## 2022-02-13 NOTE — Progress Notes (Signed)
Pt reports fetal movement, denies pain.  

## 2022-02-13 NOTE — Progress Notes (Signed)
? ?  PRENATAL VISIT NOTE ? ?Subjective:  ?Candice Kane is a 36 y.o. [phone] at 70w3dbeing seen today for ongoing prenatal care.  She is currently monitored for the following issues for this high-risk pregnancy and has Varicose veins of lower extremities with other complications; Goiter; Lipoma of back; Retained products of conception after delivery without hemorrhage; Supervision of normal pregnancy; and Diabetes mellitus during pregnancy in second trimester on their problem list. ? ?Patient doing well with no acute concerns today. She reports no complaints.  Contractions: Not present. Vag. Bleeding: None.  Movement: Present. Denies leaking of fluid.  ? ?The following portions of the patient's history were reviewed and updated as appropriate: allergies, current medications, past family history, past medical history, past social history, past surgical history and problem list. Problem list updated. ? ?Objective:  ? ?Vitals:  ? 02/13/22 1610  ?BP: 121/75  ?Pulse: 97  ?Weight: (!) 308 lb 12.8 oz (140.1 kg)  ? ? ?Fetal Status: Fetal Heart Rate (bpm): 160   Movement: Present    ? ?General:  Alert, oriented and cooperative. Patient is in no acute distress.  ?Skin: Skin is warm and dry. No rash noted.   ?Cardiovascular: Normal heart rate noted  ?Respiratory: Normal respiratory effort, no problems with respiration noted  ?Abdomen: Soft, gravid, appropriate for gestational age.  Pain/Pressure: Present     ?Pelvic: Cervical exam deferred        ?Extremities: Normal range of motion.  Edema: None  ?Mental Status:  Normal mood and affect. Normal behavior. Normal judgment and thought content.  ? ?Assessment and Plan:  ?Pregnancy: [id number] 212w3d ?1. Encounter for supervision of other normal pregnancy in second trimester ?Continue routine care ? ?2. [redacted] weeks gestation of pregnancy ? ? ?3. Pre-existing type 2 diabetes mellitus during pregnancy in second trimester ?FBS: 95-105 ?PPBS: 112-132 ? ?Most values are in range , Fasting  blood sugars seem to have the most elevations ?Will give pt 2 more weeks to improve blood sugars, but if not, will give trial of metformin BID ? ?Preterm labor symptoms and general obstetric precautions including but not limited to vaginal bleeding, contractions, leaking of fluid and fetal movement were reviewed in detail with the patient. ? ?Please refer to After Visit Summary for other counseling recommendations.  ? ?Return in about 2 weeks (around 02/27/2022) [facility] ? ? ?LaLynnda ShieldsMD ?Faculty Attending ?[facility]  ?

## 2022-02-20 ENCOUNTER — Other Ambulatory Visit: Payer: Medicaid Other

## 2022-03-03 ENCOUNTER — Ambulatory Visit: Payer: Medicaid Other | Admitting: *Deleted

## 2022-03-03 ENCOUNTER — Other Ambulatory Visit: Payer: Self-pay | Admitting: *Deleted

## 2022-03-03 ENCOUNTER — Ambulatory Visit: Payer: Medicaid Other | Attending: Obstetrics

## 2022-03-03 VITALS — BP 123/73 | HR 91

## 2022-03-03 DIAGNOSIS — Z3A22 22 weeks gestation of pregnancy: Secondary | ICD-10-CM

## 2022-03-03 DIAGNOSIS — O99212 Obesity complicating pregnancy, second trimester: Secondary | ICD-10-CM

## 2022-03-03 DIAGNOSIS — O24410 Gestational diabetes mellitus in pregnancy, diet controlled: Secondary | ICD-10-CM | POA: Diagnosis not present

## 2022-03-03 DIAGNOSIS — Z362 Encounter for other antenatal screening follow-up: Secondary | ICD-10-CM

## 2022-03-03 DIAGNOSIS — O09522 Supervision of elderly multigravida, second trimester: Secondary | ICD-10-CM

## 2022-03-03 DIAGNOSIS — E669 Obesity, unspecified: Secondary | ICD-10-CM

## 2022-03-03 DIAGNOSIS — Z3482 Encounter for supervision of other normal pregnancy, second trimester: Secondary | ICD-10-CM | POA: Insufficient documentation

## 2022-03-03 DIAGNOSIS — O321XX0 Maternal care for breech presentation, not applicable or unspecified: Secondary | ICD-10-CM | POA: Diagnosis not present

## 2022-03-06 ENCOUNTER — Other Ambulatory Visit: Payer: Medicaid Other

## 2022-03-13 ENCOUNTER — Ambulatory Visit (INDEPENDENT_AMBULATORY_CARE_PROVIDER_SITE_OTHER): Payer: Medicaid Other | Admitting: Obstetrics and Gynecology

## 2022-03-13 VITALS — BP 122/79 | HR 84 | Wt 302.5 lb

## 2022-03-13 DIAGNOSIS — O09522 Supervision of elderly multigravida, second trimester: Secondary | ICD-10-CM

## 2022-03-13 DIAGNOSIS — O24112 Pre-existing diabetes mellitus, type 2, in pregnancy, second trimester: Secondary | ICD-10-CM

## 2022-03-13 DIAGNOSIS — Z3482 Encounter for supervision of other normal pregnancy, second trimester: Secondary | ICD-10-CM

## 2022-03-13 DIAGNOSIS — Z3A24 24 weeks gestation of pregnancy: Secondary | ICD-10-CM

## 2022-03-13 MED ORDER — METFORMIN HCL 500 MG PO TABS: 500.0000 mg | ORAL_TABLET | Freq: Every day | ORAL | 5 refills | Status: DC

## 2022-03-13 NOTE — Progress Notes (Signed)
? ?  PRENATAL VISIT NOTE ? ?Subjective:  ?Candice Kane is a 36 y.o. [phone] at 36w3dbeing seen today for ongoing prenatal care.  She is currently monitored for the following issues for this high-risk pregnancy and has Varicose veins of lower extremities with other complications; Goiter; Lipoma of back; Retained products of conception after delivery without hemorrhage; Supervision of normal pregnancy; Diabetes mellitus during pregnancy in second trimester; and Advanced maternal age in multigravida on their problem list. ? ?Patient doing well with no acute concerns today. She reports no complaints.  Contractions: Not present. Vag. Bleeding: None.  Movement: Present. Denies leaking of fluid.  ? ?The following portions of the patient's history were reviewed and updated as appropriate: allergies, current medications, past family history, past medical history, past social history, past surgical history and problem list. Problem list updated. ? ?Objective:  ? ?Vitals:  ? 03/13/22 0845  ?BP: 122/79  ?Pulse: 84  ?Weight: (!) 302 lb 8 oz (137.2 kg)  ? ? ?Fetal Status: Fetal Heart Rate (bpm): 150 Fundal Height: 26 cm Movement: Present    ? ?General:  Alert, oriented and cooperative. Patient is in no acute distress.  ?Skin: Skin is warm and dry. No rash noted.   ?Cardiovascular: Normal heart rate noted  ?Respiratory: Normal respiratory effort, no problems with respiration noted  ?Abdomen: Soft, gravid, appropriate for gestational age.  Pain/Pressure: Absent     ?Pelvic: Cervical exam deferred        ?Extremities: Normal range of motion.  Edema: Trace  ?Mental Status:  Normal mood and affect. Normal behavior. Normal judgment and thought content.  ? ?Assessment and Plan:  ?Pregnancy: [id number] 219w3d ?1. Encounter for supervision of other normal pregnancy in second trimester ?Continue routine care ? ?2. [redacted] weeks gestation of pregnancy ? ? ?3. Pre-existing type 2 diabetes mellitus during pregnancy in second trimester ?FBS:  98-107 ?PPBS: 107-129, most are in range ?Will add evening metformin to address fasting BS ?Fetal echocardiogram is pending ?- metFORMIN (GLUCOPHAGE) 500 MG tablet; Take 1 tablet (500 mg total) by mouth at bedtime.  Dispense: 30 tablet; Refill: 5 ? ?4. Multigravida of advanced maternal age in second trimester ?Pt has MFM scan on 03/31/22 ? ?Preterm labor symptoms and general obstetric precautions including but not limited to vaginal bleeding, contractions, leaking of fluid and fetal movement were reviewed in detail with the patient. ? ?Please refer to After Visit Summary for other counseling recommendations.  ? ?Return in about 2 weeks (around 03/27/2022) [facility] ? ? ?LaLynnda ShieldsMD ?Faculty Attending ?[facility]  ?

## 2022-03-13 NOTE — Progress Notes (Signed)
Pt in office for [place] visit. She does not have any concerns today.  ?

## 2022-03-27 ENCOUNTER — Encounter: Payer: Self-pay | Admitting: Obstetrics and Gynecology

## 2022-03-27 ENCOUNTER — Ambulatory Visit (INDEPENDENT_AMBULATORY_CARE_PROVIDER_SITE_OTHER): Payer: Medicaid Other | Admitting: Obstetrics and Gynecology

## 2022-03-27 VITALS — BP 108/74 | HR 94 | Wt 304.0 lb

## 2022-03-27 DIAGNOSIS — O99210 Obesity complicating pregnancy, unspecified trimester: Secondary | ICD-10-CM

## 2022-03-27 DIAGNOSIS — O09522 Supervision of elderly multigravida, second trimester: Secondary | ICD-10-CM

## 2022-03-27 DIAGNOSIS — Z3482 Encounter for supervision of other normal pregnancy, second trimester: Secondary | ICD-10-CM

## 2022-03-27 DIAGNOSIS — O24112 Pre-existing diabetes mellitus, type 2, in pregnancy, second trimester: Secondary | ICD-10-CM

## 2022-03-27 NOTE — Patient Instructions (Signed)
AREA PEDIATRIC/FAMILY PRACTICE PHYSICIANS ? ?[place] ([address]) ?[facility] ?Chambliss, MD; Eniola, MD; Hale, MD; Hensel, MD; McDiarmid, MD; McIntyer, MD; Neal, MD; Walden, MD ?[address] [place], [place] [address] ?[phone] ?Mon-Fri 8:30-12:30, 1:30-5:00 ?Providers come to see babies at [facility] ?Accepting Medicaid ?[facility] ?Limited providers who accept newborns: Koirala, MD; Morrow, MD; Wolters, MD ?[address], [place], [place] [address] ?[phone] ?Mon-Fri 8:00-5:30 ?Babies seen by providers at [facility] ?Does NOT accept Medicaid ?Please call early in hospitalization for appointment (limited availability)  ?[facility] ?Mulberry, MD ?[address] [place], [place] [address] ?[phone] ?Mon, Tue, Thur, Fri 8:30-5:00, Wed 10:00-7:00 (closed 1-2pm) ?Babies seen by [facility] providers ?Accepting Medicaid ?[facility] ?Rubin, MD ?[address], [place], [place] [address] ?[phone] ?Mon-Fri 8:30-5:00, Sat 8:30-12:00 ?Provider comes to see babies at [facility] ?Accepting Medicaid ?Must have been referred from current patients or contacted office prior to delivery ?[facility] ([facility]) ?Brown, MD; Chandler, MD; Ettefagh, MD; Grant, MD; Lester, MD; McCormick, MD; McQueen, MD; Prose, MD; Simha, MD; Stanley, MD; Stryffeler, NP; Tebben, NP ?[address], [place], [place] [address] ?[phone] ?Mon, Tue, Thur, Fri 8:30-5:30, Wed 9:30-5:30, Sat 8:30-12:30 ?Babies seen by [facility] providers ?Accepting Medicaid ?Only accepting infants of first-time parents or siblings of current patients ?Hospital discharge coordinator will make follow-up appointment ?Candice Kane ?[address], [place], [place]  [address] ?[phone]   Fax - [phone] ?[facility] ?[address], [place], [place]  [address] ?Phone - [phone]   Fax - [phone] ?Candice Kane ?[address], [place], [place]  [address] ?[phone] ? ?[place] ([address]) ?[facility] ?Bates, MD; Brassfield, MD; Cooper, Cox, MD; MD; Davis, MD; Dovico, MD; Ettefaugh, MD; Little, MD; Lowe, MD; Keiffer, MD; Melvin, MD; Sumner, MD; Williams, MD ?[address], [place], [place] [address] ?[phone] ?Mon-Fri 8:30-5:00 (extended evenings Mon-Thur as needed), Sat-Sun 10:00-1:00 ?Providers come to see babies at [facility] ?Accepting Medicaid for families of first-time babies and families with all children in the household age 3 and under. Must register with office prior to making appointment (M-F only). ?[facility] ?Henson, NP; Knapp, MD; Lalonde, MD; Tysinger, PA ?[address] [place], [place] [address] ?[phone] ?Mon-Fri 8:00-5:00 ?Babies seen by providers at [facility] ?Does NOT accept Medicaid/Commercial Insurance Only ?[facility] ([facility])  ?Artis, MD; Barnes, MD; Bratton, MD; Coccaro, MD; Lockett [place], MD; Kramer, MD; Marshall, MD; Netherton, MD; Poleto, MD; Skinner, MD ?[address] [place], [place] [address] ?[phone] ?Mon-Fri 8:30-5:30, Sat (Oct.-Mar.) 9:00-1:00 ?Babies seen by providers at [facility] ?Accepting Medicaid ? ?[place] ([address]) ?[facility] ?Reid, MD; Warner, MD ?[address], [place], [place] [address] ?[phone] ?Mon-Fri 8:30-5:00, Sat 8:30-12:00 ?Providers come to see babies at [facility] ?Does NOT accept Medicaid ?[facility] ?Becker, PA; Hagler, MD; Scifres, PA; Sun, MD; Swayne, MD ?[address], [place],  [address] ?[phone] ?Mon-Fri 8:00-5:00 ?Babies seen by providers at [facility] ?Does NOT accept Medicaid ?Only accepting babies of parents who  are patients ?Please call early in hospitalization for appointment (limited availability) ?[facility] ?Clark, MD; Frye, MD; Kelleher, MD; Mack, NP; Miller, MD; O'Keller, MD; Patterson, NP; Pudlo, MD; Puzio, MD; Thomas, MD; Tucker, MD; Twiselton, MD ?[address], [place], [place] [address] ?[phone] ?Mon-Fri 8:00-5:00, Sat 9:00-12:00 ?Providers come to see babies at [facility] ?Does NOT accept Medicaid ? ?[place] ([address]) ?[facility] ?Limited providers accepting new patients: Brake, NP; Wharton, PA ?[address], [place], [place] [address] ?[phone] ?Mon-Fri 8:00-5:00 ?Babies seen by providers at [facility] ?Does NOT accept Medicaid ?Only accepting babies of parents who are patients ?Please call early in hospitalization for appointment (limited availability) ?[facility] ?Gay, MD; Quinlan, MD ?[address] [place], [place] [address] ?[phone] (press [phone] to schedule appointment) ?Mon-Fri 8:00-5:00 ?Providers come to see babies at [facility] ?Does NOT accept Medicaid ?[facility] ?Mazer, MD ?[address] [place], [place] [address] ?[phone] ?Mon-Fri 8:30-5:00 (lunch 12:30-1:00), extended hours by appointment only Wed 5:00-6:30 ?Babies seen by [facility] providers ?Accepting Medicaid ?[facility] ?Banks, MD; Jordan, MD; Koberlein, MD ?[address], [place], [place] [address] ?[phone] ?Mon-Fri 8:00-5:00 ?Babies seen by [facility] providers ?Does NOT accept Medicaid ?[facility] ?Parker, MD; Hunter, MD; Wallace, DO ?[address] [place], [place] [address] ?[phone] ?Mon-Fri 8:00-5:00 ?Babies seen by [facility] providers ?Does NOT accept Medicaid ?[facility] ?Brandon, PA; Brecken, PA; Christy, NP; Dees, MD; DeClaire, MD; DeWeese, MD; Hansen, NP; Mills, NP; Parrish, NP; Smoot, NP; Summer, MD; Vapne,  MD ?[address] [place], [place] [address] ?[phone] ?Mon-Fri 8:30-5:00, Sat 10:00-1:00 ?Providers come to see babies at [facility] ?Does NOT accept Medicaid ?Free prenatal information session Tuesdays at 4:45pm ?[facility] ?Bouska, MD; Gordon, PA; Jeffery, PA; Weber, PA ?[address] [place] [address] ?[phone] ?Mon-Fri 7:30-5:30 ?Babies seen by [facility] providers ?[facility] ?[address], [place], [place]  [address] ?[phone]   Fax - [phone] ? ?[place] ([address]) ?[facility] ?Reese, MD ?[address] [place], [place] [address] ?[phone] ?Mon-Thur 8:00-6:00 ?Providers come to see babies at [facility] ?Accepting Medicaid ?[facility] ?Anderson, NP; Badger, MD; Beal, PA; Spencer, PA ?[address] [place], [place] [address] ?[phone] ?Mon-Thur 7:30-7:30, Fri 7:30-4:30 ?Babies seen by [facility] providers ?Accepting Medicaid ?[facility] ?Agbuya, MD; Klett, NP; Romgoolam, MD ?[address], [place], [place] [address] ?[phone] ?Mon-Fri 8:30-5:00, Sat 8:30-12:00 ?Providers come to see babies at [facility] ?Accepting Medicaid ?Must have ?Meet & Greet? appointment at office prior to delivery ?[facility] ([facility]) ?McCord, MD; Wallace, MD; Wood, MD ?[address], [place], [place] [address] ?[phone] ?Mon-Wed 8:00-6:00, Thur-Fri 8:00-5:00, Sat 9:00-12:00 ?Providers come to see babies at [facility] ?Does NOT accept Medicaid ?Only accepting siblings of current patients ?[facility]  ?[address], , [place]  [address] ?[phone]   Fax - [phone] ?[facility] ?[address], , [place]  [address] ?[phone]   Fax -  [phone] ? ?[place]  ([address] & [address]) ?[facility] ?Cirigliano, DO; Matthews, DO ?[address] , [place] [address] ?[phone] ?Mon-Fri 7:00-5:00 ?Babies seen by [facility]
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
## 2022-03-27 NOTE — Progress Notes (Signed)
Pt is having some numbness and tingling in right leg, ?sciatica.  ?Pt states she has not started Metformin yet.  ?Pt has u/s on 5/1 and fetal echo 5/4.  ? ?

## 2022-03-31 ENCOUNTER — Ambulatory Visit: Payer: Medicaid Other | Admitting: *Deleted

## 2022-03-31 ENCOUNTER — Ambulatory Visit: Payer: Medicaid Other | Attending: Obstetrics

## 2022-03-31 ENCOUNTER — Other Ambulatory Visit: Payer: Self-pay | Admitting: *Deleted

## 2022-03-31 VITALS — BP 99/60 | HR 88

## 2022-03-31 DIAGNOSIS — O99212 Obesity complicating pregnancy, second trimester: Secondary | ICD-10-CM

## 2022-03-31 DIAGNOSIS — O24419 Gestational diabetes mellitus in pregnancy, unspecified control: Secondary | ICD-10-CM

## 2022-03-31 DIAGNOSIS — Z362 Encounter for other antenatal screening follow-up: Secondary | ICD-10-CM | POA: Insufficient documentation

## 2022-03-31 DIAGNOSIS — O09522 Supervision of elderly multigravida, second trimester: Secondary | ICD-10-CM | POA: Diagnosis not present

## 2022-03-31 DIAGNOSIS — O43122 Velamentous insertion of umbilical cord, second trimester: Secondary | ICD-10-CM | POA: Insufficient documentation

## 2022-03-31 DIAGNOSIS — O99210 Obesity complicating pregnancy, unspecified trimester: Secondary | ICD-10-CM

## 2022-03-31 DIAGNOSIS — O24415 Gestational diabetes mellitus in pregnancy, controlled by oral hypoglycemic drugs: Secondary | ICD-10-CM | POA: Diagnosis not present

## 2022-03-31 DIAGNOSIS — Z3482 Encounter for supervision of other normal pregnancy, second trimester: Secondary | ICD-10-CM

## 2022-03-31 DIAGNOSIS — Z3689 Encounter for other specified antenatal screening: Secondary | ICD-10-CM

## 2022-03-31 DIAGNOSIS — Z3A27 27 weeks gestation of pregnancy: Secondary | ICD-10-CM | POA: Insufficient documentation

## 2022-03-31 DIAGNOSIS — O43192 Other malformation of placenta, second trimester: Secondary | ICD-10-CM

## 2022-04-03 DIAGNOSIS — O24410 Gestational diabetes mellitus in pregnancy, diet controlled: Secondary | ICD-10-CM | POA: Diagnosis not present

## 2022-04-03 DIAGNOSIS — Z3A27 27 weeks gestation of pregnancy: Secondary | ICD-10-CM | POA: Diagnosis not present

## 2022-04-10 ENCOUNTER — Encounter: Payer: Medicaid Other | Admitting: Obstetrics and Gynecology

## 2022-04-15 ENCOUNTER — Encounter: Payer: Medicaid Other | Admitting: Obstetrics & Gynecology

## 2022-04-17 ENCOUNTER — Ambulatory Visit (INDEPENDENT_AMBULATORY_CARE_PROVIDER_SITE_OTHER): Payer: Medicaid Other

## 2022-04-17 VITALS — BP 126/81 | HR 89 | Wt 310.6 lb

## 2022-04-17 DIAGNOSIS — Z3A29 29 weeks gestation of pregnancy: Secondary | ICD-10-CM

## 2022-04-17 DIAGNOSIS — O24419 Gestational diabetes mellitus in pregnancy, unspecified control: Secondary | ICD-10-CM

## 2022-04-17 DIAGNOSIS — Z3483 Encounter for supervision of other normal pregnancy, third trimester: Secondary | ICD-10-CM

## 2022-04-17 NOTE — Progress Notes (Signed)
Pt presents for ROB forgot CBG log. Tdap declined

## 2022-04-17 NOTE — Progress Notes (Signed)
   [facility] PREGNANCY OFFICE VISIT  Patient name: Candice Kane MRN [id number]  Date of birth: 27-Aug-1986 Chief Complaint:   Routine Prenatal Visit  Subjective:   Candice Kane is a 36 y.o. [id number] female at 69w3dwith an Estimated Date of Delivery: 06/30/22 being seen today for ongoing management of a high-risk pregnancy aeb has Varicose veins of lower extremities with other complications; Goiter; Lipoma of back; Retained products of conception after delivery without hemorrhage; Supervision of normal pregnancy; Diabetes mellitus during pregnancy in second trimester; Advanced maternal age in multigravida; and Maternal obesity affecting pregnancy, antepartum on their problem list.  Patient presents today, alone, with  c/o intermittent pelvic pressure, usually with standing after sitting .  Patient endorses fetal movement. Patient denies abdominal cramping or contractions.  Patient denies vaginal concerns including abnormal discharge, leaking of fluid, and bleeding.    Patient states her fasting CBG has improved with metformin dosing.  She states she did not bring in her log today.  Contractions: Not present. Vag. Bleeding: None.  Movement: Present.  Reviewed past medical,surgical, social, obstetrical and family history as well as problem list, medications and allergies.  Objective   Vitals:   04/17/22 1441  BP: 126/81  Pulse: 89  Weight: (!) 310 lb 9.6 oz (140.9 kg)  Body mass index is 40.98 kg/m.  Total Weight Gain:15 lb 9.6 oz (7.076 kg)         Physical Examination:   General appearance: Well appearing, and in no distress  Mental status: Alert, oriented to person, place, and time  Skin: Warm & dry  Cardiovascular: Normal heart rate noted  Respiratory: Normal respiratory effort, no distress  Abdomen: Soft, gravid, nontender, AGA with Fundal Height: 31 cm  Pelvic: Cervical exam deferred          Extremities: Edema: Trace Varicose veins noted on legs/calves. Encouraged usage of  compression stockings.   Fetal Status: Fetal Heart Rate (bpm): 136  Movement: Present   No results found for this or any previous visit (from the past 24 hour(s)).  Assessment & Plan:  High-risk pregnancy of a 36y.o., [id number] 29w3dith an Estimated Date of Delivery: 06/30/22   1. Encounter for supervision of other normal pregnancy in third trimester -Anticipatory guidance for upcoming appts. -Patient to schedule next appt in 2-3 weeks for an in-person visit. -Remainder of 3rd trimester labs to be completed. Reviewed with patient.  -CBC, RPR, and HIV ordered  2. [redacted] weeks gestation of pregnancy -Doing well. -Reassured that pelvic pain normal. -Encouraged continue water intake for managing constipation.   3. Gestational diabetes mellitus (GDM) affecting pregnancy -Instructed to bring in logs at each visit. -Taking metformin '500mg'$  at hs.    Meds: No orders of the defined types were placed in this encounter.  Labs/procedures today:  Lab Orders         HIV Antibody (routine testing w rflx)         RPR         CBC       Reviewed: Preterm labor symptoms and general obstetric precautions including but not limited to vaginal bleeding, contractions, leaking of fluid and fetal movement were reviewed in detail with the patient.  All questions were answered.  Follow-up: No follow-ups on file.  Orders Placed This Encounter  Procedures   HIV Antibody (routine testing w rflx)   RPR   CBC   JeMaryann ConnersSN, CNM 04/17/2022

## 2022-04-18 LAB — CBC
Hematocrit: 29.6 % — ABNORMAL LOW (ref 34.0–46.6)
Hemoglobin: 9.7 g/dL — ABNORMAL LOW (ref 11.1–15.9)
MCH: 26.7 pg (ref 26.6–33.0)
MCHC: 32.8 g/dL (ref 31.5–35.7)
MCV: 82 fL (ref 79–97)
Platelets: 503 10*3/uL — ABNORMAL HIGH (ref 150–450)
RBC: 3.63 x10E6/uL — ABNORMAL LOW (ref 3.77–5.28)
RDW: 14.5 % (ref 11.7–15.4)
WBC: 9.4 10*3/uL (ref 3.4–10.8)

## 2022-04-18 LAB — RPR: RPR Ser Ql: NONREACTIVE

## 2022-04-18 LAB — HIV ANTIBODY (ROUTINE TESTING W REFLEX): HIV Screen 4th Generation wRfx: NONREACTIVE

## 2022-04-21 DIAGNOSIS — O99013 Anemia complicating pregnancy, third trimester: Secondary | ICD-10-CM | POA: Insufficient documentation

## 2022-05-01 ENCOUNTER — Ambulatory Visit (INDEPENDENT_AMBULATORY_CARE_PROVIDER_SITE_OTHER): Payer: Medicaid Other | Admitting: Obstetrics and Gynecology

## 2022-05-01 VITALS — BP 109/72 | HR 102 | Wt 309.9 lb

## 2022-05-01 DIAGNOSIS — Z3483 Encounter for supervision of other normal pregnancy, third trimester: Secondary | ICD-10-CM

## 2022-05-01 DIAGNOSIS — O24415 Gestational diabetes mellitus in pregnancy, controlled by oral hypoglycemic drugs: Secondary | ICD-10-CM

## 2022-05-01 DIAGNOSIS — Z3A31 31 weeks gestation of pregnancy: Secondary | ICD-10-CM

## 2022-05-01 DIAGNOSIS — O09523 Supervision of elderly multigravida, third trimester: Secondary | ICD-10-CM

## 2022-05-01 NOTE — Progress Notes (Signed)
   PRENATAL VISIT NOTE  Subjective:  Candice Kane is a 36 y.o. G4P1021 at 7w3dbeing seen today for ongoing prenatal care.  She is currently monitored for the following issues for this high-risk pregnancy and has Varicose veins of lower extremities with other complications; Goiter; Lipoma of back; Retained products of conception after delivery without hemorrhage; Supervision of normal pregnancy; Diabetes mellitus during pregnancy in second trimester; Advanced maternal age in multigravida; Maternal obesity affecting pregnancy, antepartum; and Anemia in pregnancy, third trimester on their problem list.  Patient doing well with no acute concerns today. She reports no complaints.  Contractions: Not present. Vag. Bleeding: None.  Movement: Present. Denies leaking of fluid.   The following portions of the patient's history were reviewed and updated as appropriate: allergies, current medications, past family history, past medical history, past social history, past surgical history and problem list. Problem list updated.  Objective:   Vitals:   05/01/22 1355  BP: 109/72  Pulse: (!) 102  Weight: (!) 309 lb 14.4 oz (140.6 kg)    Fetal Status: Fetal Heart Rate (bpm): 145 Fundal Height: 34 cm Movement: Present     General:  Alert, oriented and cooperative. Patient is in no acute distress.  Skin: Skin is warm and dry. No rash noted.   Cardiovascular: Normal heart rate noted  Respiratory: Normal respiratory effort, no problems with respiration noted  Abdomen: Soft, gravid, appropriate for gestational age.  Pain/Pressure: Present     Pelvic: Cervical exam deferred        Extremities: Normal range of motion.  Edema: None  Mental Status:  Normal mood and affect. Normal behavior. Normal judgment and thought content.   Assessment and Plan:  Pregnancy: G4P1021 at 382w3d1. [redacted] weeks gestation of pregnancy   2. Gestational diabetes mellitus (GDM) in second trimester controlled on oral hypoglycemic  drug Pt did not bring in blood sugars Per her recall FBS: 89-93 PPBS: avg 101  Discussed continuing good food choices Weekly testing at 33 weeks, growth at 6/12  3. Encounter for supervision of other normal pregnancy in third trimester Continue routine prenatal care  4. Multigravida of advanced maternal age in third trimester   Preterm labor symptoms and general obstetric precautions including but not limited to vaginal bleeding, contractions, leaking of fluid and fetal movement were reviewed in detail with the patient.  Please refer to After Visit Summary for other counseling recommendations.   Return in about 2 weeks (around 05/15/2022) [facility].   LaLynnda ShieldsMD Faculty Attending Center for [facility]

## 2022-05-01 NOTE — Progress Notes (Signed)
Patient presents for ROB. Patient has no concerns today. 

## 2022-05-12 ENCOUNTER — Ambulatory Visit: Payer: Medicaid Other | Admitting: *Deleted

## 2022-05-12 ENCOUNTER — Encounter: Payer: Self-pay | Admitting: *Deleted

## 2022-05-12 ENCOUNTER — Ambulatory Visit: Payer: Medicaid Other | Attending: Obstetrics and Gynecology

## 2022-05-12 VITALS — BP 124/73 | HR 97

## 2022-05-12 DIAGNOSIS — O24419 Gestational diabetes mellitus in pregnancy, unspecified control: Secondary | ICD-10-CM | POA: Insufficient documentation

## 2022-05-12 DIAGNOSIS — O99213 Obesity complicating pregnancy, third trimester: Secondary | ICD-10-CM

## 2022-05-12 DIAGNOSIS — Z3689 Encounter for other specified antenatal screening: Secondary | ICD-10-CM | POA: Insufficient documentation

## 2022-05-12 DIAGNOSIS — O99212 Obesity complicating pregnancy, second trimester: Secondary | ICD-10-CM | POA: Insufficient documentation

## 2022-05-12 DIAGNOSIS — Z3A33 33 weeks gestation of pregnancy: Secondary | ICD-10-CM

## 2022-05-12 DIAGNOSIS — O09522 Supervision of elderly multigravida, second trimester: Secondary | ICD-10-CM | POA: Diagnosis present

## 2022-05-12 DIAGNOSIS — O24415 Gestational diabetes mellitus in pregnancy, controlled by oral hypoglycemic drugs: Secondary | ICD-10-CM | POA: Diagnosis not present

## 2022-05-12 DIAGNOSIS — O43193 Other malformation of placenta, third trimester: Secondary | ICD-10-CM

## 2022-05-12 DIAGNOSIS — E669 Obesity, unspecified: Secondary | ICD-10-CM | POA: Diagnosis not present

## 2022-05-12 DIAGNOSIS — O99210 Obesity complicating pregnancy, unspecified trimester: Secondary | ICD-10-CM | POA: Diagnosis present

## 2022-05-16 ENCOUNTER — Other Ambulatory Visit: Payer: Medicaid Other

## 2022-05-16 ENCOUNTER — Encounter: Payer: Self-pay | Admitting: Advanced Practice Midwife

## 2022-05-16 ENCOUNTER — Telehealth (INDEPENDENT_AMBULATORY_CARE_PROVIDER_SITE_OTHER): Payer: Medicaid Other | Admitting: Advanced Practice Midwife

## 2022-05-16 VITALS — BP 144/94 | HR 90

## 2022-05-16 VITALS — BP 129/84 | HR 85

## 2022-05-16 DIAGNOSIS — O99210 Obesity complicating pregnancy, unspecified trimester: Secondary | ICD-10-CM

## 2022-05-16 DIAGNOSIS — Z3A33 33 weeks gestation of pregnancy: Secondary | ICD-10-CM

## 2022-05-16 DIAGNOSIS — Z3483 Encounter for supervision of other normal pregnancy, third trimester: Secondary | ICD-10-CM

## 2022-05-16 DIAGNOSIS — O163 Unspecified maternal hypertension, third trimester: Secondary | ICD-10-CM

## 2022-05-16 DIAGNOSIS — O09893 Supervision of other high risk pregnancies, third trimester: Secondary | ICD-10-CM

## 2022-05-16 DIAGNOSIS — O99213 Obesity complicating pregnancy, third trimester: Secondary | ICD-10-CM

## 2022-05-16 DIAGNOSIS — O09523 Supervision of elderly multigravida, third trimester: Secondary | ICD-10-CM

## 2022-05-16 NOTE — Addendum Note (Signed)
Addended by: Marcille Buffy D on: 05/16/2022 11:18 AM   Modules accepted: Orders

## 2022-05-16 NOTE — Progress Notes (Signed)
Pt presents for virtual [place] visit. She has no concerns at this time.

## 2022-05-16 NOTE — Progress Notes (Addendum)
TELEHEALTH OBSTETRICS VISIT ENCOUNTER NOTE  Provider location: [facility]   Patient location: Home  I connected with Drexel Iha on 05/16/22 at  9:35 AM EDT by mychart video at home and verified that I am speaking with the correct person using two identifiers.   I discussed the limitations, risks, security and privacy concerns of performing an evaluation and management service by telephone and the availability of in person appointments. I also discussed with the patient that there may be a patient responsible charge related to this service. The patient expressed understanding and agreed to proceed.  Subjective:  Candice Kane is a 36 y.o. G4P1021 at 78w4dbeing followed for ongoing prenatal care.  She is currently monitored for the following issues for this high-risk pregnancy and has Varicose veins of lower extremities with other complications; Goiter; Lipoma of back; Retained products of conception after delivery without hemorrhage; Supervision of normal pregnancy; Diabetes mellitus during pregnancy in second trimester; Advanced maternal age in multigravida; Maternal obesity affecting pregnancy, antepartum; and Anemia in pregnancy, third trimester on their problem list.  Patient reports no complaints. Reports fetal movement. Denies any contractions, bleeding or leaking of fluid.   The following portions of the patient's history were reviewed and updated as appropriate: allergies, current medications, past family history, past medical history, past social history, past surgical history and problem list.   Objective:  Blood pressure (!) 144/94, pulse 90, last menstrual period 09/23/2021, unknown if currently breastfeeding. General:  Alert, oriented and cooperative.   Mental Status: Normal mood and affect perceived. Normal judgment and thought content.  Rest of physical exam deferred due to type of encounter  140/90 144/94  Assessment and Plan:  Pregnancy:  G4P1021 at [redacted]w[redacted]d. Encounter for supervision of other normal pregnancy in third trimester  - Patient with hypertension on home machine today. She has used earlier in pregnancy with normal reading. She denies any HA, visual disturbance or RUQ pain. She will come in to the office today for labs and BP check in person. She has MFM visit on Monday, and they can recheck BP at that time, and assist with care planning.   2. Maternal obesity affecting pregnancy, antepartum   3. [redacted] weeks gestation of pregnancy   Preterm labor symptoms and general obstetric precautions including but not limited to vaginal bleeding, contractions, leaking of fluid and fetal movement were reviewed in detail with the patient.  I discussed the assessment and treatment plan with the patient. The patient was provided an opportunity to ask questions and all were answered. The patient agreed with the plan and demonstrated an understanding of the instructions. The patient was advised to call back or seek an in-person office evaluation/[facility]or any urgent or concerning symptoms. Please refer to After Visit Summary for other counseling recommendations.   I provided 12 minutes of non-face-to-face time during this encounter.  Return in about 1 week (around 05/23/2022).  Future Appointments  Date Time Provider [place]6/19/2023  3:15 PM [facility] NURSE [facility]6/19/20[facility]  3:30 PM [facility]6/26/2023  3:15 PM [facility] NURSE [facility]6/26/2023  3:30 PM [facility] US3 [facility]6/29/2023  1:50 PM ErChancy MilroyMD CWSorrelone  06/05/2022  4:10 PM BaGriffin BasilMD [facility] None  06/12/2022  1:50 PM BaGriffin BasilMD CWBryanone  06/19/2022  2:30 PM Constant, PeVickii ChafeMD CWLeCheeone  06/26/2022  1:30 PM Chancy Milroy, MD [place] None    Marcille Buffy, [place] for [facility], [facility]    11:16 AM addendum: Patient in the  office for labs BP in office at this time: 129/84 Will get CBC, CMET and Urine Protein:creatinine.  FU as needed.  Pre-eclampsia warning signs reviewed   Marcille Buffy DNP, CNM  05/16/22  11:17 AM

## 2022-05-17 LAB — COMPREHENSIVE METABOLIC PANEL
ALT: 8 IU/L (ref 0–32)
AST: 8 IU/L (ref 0–40)
Albumin/Globulin Ratio: 1.1 — ABNORMAL LOW (ref 1.2–2.2)
Albumin: 3.3 g/dL — ABNORMAL LOW (ref 3.8–4.8)
Alkaline Phosphatase: 156 IU/L — ABNORMAL HIGH (ref 44–121)
BUN/Creatinine Ratio: 8 — ABNORMAL LOW (ref 9–23)
BUN: 4 mg/dL — ABNORMAL LOW (ref 6–20)
Bilirubin Total: <0.2 mg/dL (ref 0.0–1.2)
CO2: 21 mmol/L (ref 20–29)
Calcium: 8.7 mg/dL (ref 8.7–10.2)
Chloride: 104 mmol/L (ref 96–106)
Creatinine, Ser: 0.51 mg/dL — ABNORMAL LOW (ref 0.57–1.00)
Globulin, Total: 2.9 g/dL (ref 1.5–4.5)
Glucose: 89 mg/dL (ref 70–99)
Potassium: 4.2 mmol/L (ref 3.5–5.2)
Sodium: 136 mmol/L (ref 134–144)
Total Protein: 6.2 g/dL (ref 6.0–8.5)
eGFR: 125 mL/min/{1.73_m2} (ref >59)

## 2022-05-17 LAB — CBC WITH DIFFERENTIAL/PLATELET
Basophils Absolute: 0.1 10*3/uL (ref 0.0–0.2)
Basos: 1 %
EOS (ABSOLUTE): 0.1 10*3/uL (ref 0.0–0.4)
Eos: 1 %
Hematocrit: 30.6 % — ABNORMAL LOW (ref 34.0–46.6)
Hemoglobin: 9.8 g/dL — ABNORMAL LOW (ref 11.1–15.9)
Immature Grans (Abs): 0.1 10*3/uL (ref 0.0–0.1)
Immature Granulocytes: 1 %
Lymphocytes Absolute: 2.6 10*3/uL (ref 0.7–3.1)
Lymphs: 26 %
MCH: 26.3 pg — ABNORMAL LOW (ref 26.6–33.0)
MCHC: 32 g/dL (ref 31.5–35.7)
MCV: 82 fL (ref 79–97)
Monocytes Absolute: 0.7 10*3/uL (ref 0.1–0.9)
Monocytes: 7 %
Neutrophils Absolute: 6.5 10*3/uL (ref 1.4–7.0)
Neutrophils: 64 %
Platelets: 448 10*3/uL (ref 150–450)
RBC: 3.73 x10E6/uL — ABNORMAL LOW (ref 3.77–5.28)
RDW: 15.2 % (ref 11.7–15.4)
WBC: 10.1 10*3/uL (ref 3.4–10.8)

## 2022-05-17 LAB — PROTEIN / CREATININE RATIO, URINE
Creatinine, Urine: 172 mg/dL
Protein, Ur: 24.2 mg/dL
Protein/Creat Ratio: 141 mg/g creat (ref 0–200)

## 2022-05-19 ENCOUNTER — Other Ambulatory Visit: Payer: Self-pay | Admitting: Obstetrics and Gynecology

## 2022-05-19 ENCOUNTER — Ambulatory Visit: Payer: Medicaid Other | Admitting: *Deleted

## 2022-05-19 ENCOUNTER — Ambulatory Visit: Payer: Medicaid Other | Attending: Obstetrics and Gynecology

## 2022-05-19 ENCOUNTER — Encounter: Payer: Self-pay | Admitting: *Deleted

## 2022-05-19 VITALS — BP 123/67 | HR 86

## 2022-05-19 DIAGNOSIS — O99212 Obesity complicating pregnancy, second trimester: Secondary | ICD-10-CM

## 2022-05-19 DIAGNOSIS — O99210 Obesity complicating pregnancy, unspecified trimester: Secondary | ICD-10-CM

## 2022-05-19 DIAGNOSIS — O09522 Supervision of elderly multigravida, second trimester: Secondary | ICD-10-CM

## 2022-05-19 DIAGNOSIS — O99213 Obesity complicating pregnancy, third trimester: Secondary | ICD-10-CM | POA: Diagnosis not present

## 2022-05-19 DIAGNOSIS — Z3689 Encounter for other specified antenatal screening: Secondary | ICD-10-CM | POA: Insufficient documentation

## 2022-05-19 DIAGNOSIS — O24419 Gestational diabetes mellitus in pregnancy, unspecified control: Secondary | ICD-10-CM | POA: Insufficient documentation

## 2022-05-19 DIAGNOSIS — Z3483 Encounter for supervision of other normal pregnancy, third trimester: Secondary | ICD-10-CM

## 2022-05-19 DIAGNOSIS — O283 Abnormal ultrasonic finding on antenatal screening of mother: Secondary | ICD-10-CM

## 2022-05-19 DIAGNOSIS — Z3A34 34 weeks gestation of pregnancy: Secondary | ICD-10-CM | POA: Diagnosis not present

## 2022-05-19 DIAGNOSIS — O43123 Velamentous insertion of umbilical cord, third trimester: Secondary | ICD-10-CM | POA: Diagnosis not present

## 2022-05-19 DIAGNOSIS — E669 Obesity, unspecified: Secondary | ICD-10-CM

## 2022-05-19 DIAGNOSIS — O24415 Gestational diabetes mellitus in pregnancy, controlled by oral hypoglycemic drugs: Secondary | ICD-10-CM

## 2022-05-19 DIAGNOSIS — O43193 Other malformation of placenta, third trimester: Secondary | ICD-10-CM

## 2022-05-19 DIAGNOSIS — O09523 Supervision of elderly multigravida, third trimester: Secondary | ICD-10-CM

## 2022-05-19 NOTE — Procedures (Signed)
Candice Kane 06-29-1986 [redacted]w[redacted]d Fetus A Non-Stress Test Interpretation for 05/19/22  Indication: Unsatisfactory BPP  Fetal Heart Rate A Mode: External Baseline Rate (A): 140 bpm Variability: Moderate Accelerations: 15 x 15 Decelerations: None Multiple birth?: No  Uterine Activity Mode: Palpation, Toco Contraction Frequency (min): none Resting Tone Palpated: Relaxed  Interpretation (Fetal Testing) Nonstress Test Interpretation: Reactive Overall Impression: Reassuring for gestational age Comments: Dr. FAnnamaria Bootsreviewed tracing

## 2022-05-26 ENCOUNTER — Encounter (HOSPITAL_COMMUNITY): Payer: Self-pay | Admitting: Family Medicine

## 2022-05-26 ENCOUNTER — Inpatient Hospital Stay (HOSPITAL_COMMUNITY)
Admission: AD | Admit: 2022-05-26 | Discharge: 2022-05-26 | Disposition: A | Discharge status: Home / Self Care | Payer: Medicaid Other | Attending: Family Medicine | Admitting: Family Medicine

## 2022-05-26 ENCOUNTER — Encounter: Payer: Self-pay | Admitting: *Deleted

## 2022-05-26 ENCOUNTER — Ambulatory Visit: Payer: Medicaid Other | Admitting: *Deleted

## 2022-05-26 ENCOUNTER — Inpatient Hospital Stay (HOSPITAL_BASED_OUTPATIENT_CLINIC_OR_DEPARTMENT_OTHER): Payer: Medicaid Other

## 2022-05-26 ENCOUNTER — Ambulatory Visit: Payer: Medicaid Other | Attending: Obstetrics and Gynecology

## 2022-05-26 VITALS — BP 121/74 | HR 89

## 2022-05-26 DIAGNOSIS — Z3A35 35 weeks gestation of pregnancy: Secondary | ICD-10-CM

## 2022-05-26 DIAGNOSIS — O99213 Obesity complicating pregnancy, third trimester: Secondary | ICD-10-CM | POA: Insufficient documentation

## 2022-05-26 DIAGNOSIS — O288 Other abnormal findings on antenatal screening of mother: Secondary | ICD-10-CM | POA: Diagnosis not present

## 2022-05-26 DIAGNOSIS — Z3689 Encounter for other specified antenatal screening: Secondary | ICD-10-CM | POA: Diagnosis not present

## 2022-05-26 DIAGNOSIS — O24113 Pre-existing diabetes mellitus, type 2, in pregnancy, third trimester: Secondary | ICD-10-CM | POA: Insufficient documentation

## 2022-05-26 DIAGNOSIS — Z7984 Long term (current) use of oral hypoglycemic drugs: Secondary | ICD-10-CM | POA: Diagnosis not present

## 2022-05-26 DIAGNOSIS — O09522 Supervision of elderly multigravida, second trimester: Secondary | ICD-10-CM | POA: Insufficient documentation

## 2022-05-26 DIAGNOSIS — O24112 Pre-existing diabetes mellitus, type 2, in pregnancy, second trimester: Secondary | ICD-10-CM

## 2022-05-26 DIAGNOSIS — O09523 Supervision of elderly multigravida, third trimester: Secondary | ICD-10-CM | POA: Insufficient documentation

## 2022-05-26 DIAGNOSIS — O43123 Velamentous insertion of umbilical cord, third trimester: Secondary | ICD-10-CM | POA: Diagnosis not present

## 2022-05-26 DIAGNOSIS — O99210 Obesity complicating pregnancy, unspecified trimester: Secondary | ICD-10-CM | POA: Insufficient documentation

## 2022-05-26 DIAGNOSIS — O24419 Gestational diabetes mellitus in pregnancy, unspecified control: Secondary | ICD-10-CM | POA: Insufficient documentation

## 2022-05-26 DIAGNOSIS — Z3483 Encounter for supervision of other normal pregnancy, third trimester: Secondary | ICD-10-CM

## 2022-05-26 DIAGNOSIS — E118 Type 2 diabetes mellitus with unspecified complications: Secondary | ICD-10-CM | POA: Insufficient documentation

## 2022-05-26 DIAGNOSIS — O99212 Obesity complicating pregnancy, second trimester: Secondary | ICD-10-CM | POA: Insufficient documentation

## 2022-05-26 DIAGNOSIS — O24415 Gestational diabetes mellitus in pregnancy, controlled by oral hypoglycemic drugs: Secondary | ICD-10-CM | POA: Insufficient documentation

## 2022-05-26 LAB — GLUCOSE, CAPILLARY: Glucose-Capillary: 83 mg/dL (ref 70–99)

## 2022-05-26 MED ORDER — METFORMIN HCL 500 MG PO TABS: 500.0000 mg | ORAL_TABLET | Freq: Every day | ORAL | 5 refills | Status: DC

## 2022-05-26 MED ORDER — METFORMIN HCL 500 MG PO TABS
500.0000 mg | ORAL_TABLET | Freq: Every day | ORAL | Status: DC
  Administered 2022-05-26: 500 mg via ORAL
  Filled 2022-05-26: qty 1

## 2022-05-26 NOTE — MAU Provider Note (Signed)
History     CSN: [id number]  Arrival date and time: 05/26/22 1643   Event Date/Time   First Provider Initiated Contact with Patient 05/26/22 1738      No chief complaint on file.  HPI  Ms.Candice Kane is a 36 y.o. female [phone] @ 31w0dhere for extended fetal monitoring. She was seen at MFM today and had a BPP of 4/8, she was sent here for monitoring and repeat BPP after 4 hours.   History of preexisting diabetes, on metformin. Ran out of metformin 3 days ago and has not taken it since. She was taking 500 mg nightly.   Normal fetal movements.   OB History     Gravida  4   Para  1   Term  1   Preterm      AB  2   Living  1      SAB  1   IAB      Ectopic      Multiple      Live Births  1           Past Medical History:  Diagnosis Date   Anemia    Asthma    Bronchitis    recent occurance   Chest pain    COPD (chronic obstructive pulmonary disease) ([place]    Cough    Goiter 03/01/2013   History of chlamydia 12/01/2009   Lump of skin of back    Lower Right side - Lipoma   Sciatica of right side    Varicose veins     Past Surgical History:  Procedure Laterality Date   BACK SURGERY  08/14/2021   TONSILECTOMY, ADENOIDECTOMY, BILATERAL MYRINGOTOMY AND TUBES      Family History  Problem Relation Age of Onset   Cancer Mother        breast   Kidney failure Mother    Diabetes Father    Hyperlipidemia Father    Hypertension Father    Colon cancer Maternal Grandmother        colon   COPD Maternal Grandfather     Social History   Tobacco Use   Smoking status: Former    Types: Cigars   Smokeless tobacco: Never  Vaping Use   Vaping Use: Never used  Substance Use Topics   Alcohol use: Not Currently    Comment: rare, not since confirmed pregnancy   Drug use: No    Allergies: No Known Allergies  Medications Prior to Admission  Medication Sig Dispense Refill Last Dose   aspirin 81 MG chewable tablet Chew 1 tablet (81 mg total) by  mouth daily. 30 tablet 7 Past Month   Iron Polysacch Cmplx-B12-FA 150-0.025-1 MG CAPS Take 1 capsule by mouth every other day. 30 capsule 5 05/25/2022   prenatal vitamin w/FE, FA (PRENATAL 1 + 1) 27-1 MG TABS tablet Take 1 tablet by mouth daily at 12 noon.   05/25/2022   Accu-Chek Softclix Lancets lancets Check capillary blood sugars 4x's daily 100 each 12    acetaminophen (TYLENOL) 500 MG tablet Take 2 tablets (1,000 mg total) by mouth every 8 (eight) hours as needed for moderate pain. 30 tablet 0    Blood Glucose Monitoring Suppl (ACCU-CHEK GUIDE) w/Device KIT Check capillary blood sugars 4x's daily 1 kit 0    Blood Pressure Monitoring (BLOOD PRESSURE KIT) DEVI 1 kit by Does not apply route once a week. 1 each 0    glucose blood (ACCU-CHEK GUIDE) test strip Use to check  blood sugars four times a day was instructed 100 each 12    metFORMIN (GLUCOPHAGE) 500 MG tablet Take 1 tablet (500 mg total) by mouth at bedtime. 30 tablet 5 05/23/2022   Misc. Devices (GOJJI WEIGHT SCALE) MISC 1 Device by Does not apply route every 30 (thirty) days. 1 each 0    Results for orders placed or performed during the hospital encounter of 05/26/22 (from the past 48 hour(s))  Glucose, capillary     Status: None   Collection Time: 05/26/22  5:37 PM  Result Value Ref Range   Glucose-Capillary 83 70 - 99 mg/dL    Comment: Glucose reference range applies only to samples taken after fasting for at least 8 hours.     Review of Systems  Eyes:  Negative for photophobia and visual disturbance.  Gastrointestinal:  Negative for abdominal pain.  Genitourinary:  Negative for vaginal bleeding and vaginal discharge.  Neurological:  Negative for headaches.   Physical Exam   Blood pressure 116/79, pulse 88, temperature 99.5 F (37.5 C), temperature source Oral, resp. rate 17, height 6' 1"  (1.854 m), weight (!) 144.3 kg, last menstrual period 09/23/2021, SpO2 98 %, unknown if currently breastfeeding.  Patient Vitals for the past  24 hrs:  BP Temp Temp src Pulse Resp SpO2 Height Weight  05/26/22 1710 116/79 99.5 F (37.5 C) Oral 88 17 98 % -- --  05/26/22 1702 -- -- -- -- -- -- 6' 1"  (1.854 m) (!) 144.3 kg     Physical Exam Constitutional:      General: She is not in acute distress.    Appearance: Normal appearance. She is not ill-appearing, toxic-appearing or diaphoretic.  HENT:     Head: Normocephalic.  Musculoskeletal:        General: Normal range of motion.  Neurological:     Mental Status: She is alert and oriented to person, place, and time.  Psychiatric:        Behavior: Behavior normal.   Fetal Tracing: Baseline: 145 bpm Variability: Moderate  Accelerations: 15x15 Decelerations: None  Toco: None  MAU Course  Procedures  MDM  Reviewed patient with Dr. Ilda Basset NST grossly reactive Repeat BPP 8/8 >10/10. [place] for DC home.  Reviewed results with patient.  Metformin given 500 mg PO prior to DC Assessment and Plan   A:  1. NST (non-stress test) reactive   2. [redacted] weeks gestation of pregnancy   3. Pre-existing type 2 diabetes mellitus during pregnancy in second trimester      P:  Dc home Fetal kick counts Rx: Metformin  Return to MAU if symptoms worsen Keep appointments with MFM  Burnis Halling, Artist Pais, NP 05/26/2022 9:31 PM

## 2022-05-27 ENCOUNTER — Telehealth: Payer: Self-pay

## 2022-05-29 ENCOUNTER — Ambulatory Visit (INDEPENDENT_AMBULATORY_CARE_PROVIDER_SITE_OTHER): Payer: Medicaid Other | Admitting: Obstetrics and Gynecology

## 2022-05-29 ENCOUNTER — Encounter: Payer: Self-pay | Admitting: Obstetrics and Gynecology

## 2022-05-29 VITALS — BP 112/74 | HR 85 | Wt 318.8 lb

## 2022-05-29 DIAGNOSIS — O99210 Obesity complicating pregnancy, unspecified trimester: Secondary | ICD-10-CM

## 2022-05-29 DIAGNOSIS — Z3483 Encounter for supervision of other normal pregnancy, third trimester: Secondary | ICD-10-CM

## 2022-05-29 DIAGNOSIS — O24415 Gestational diabetes mellitus in pregnancy, controlled by oral hypoglycemic drugs: Secondary | ICD-10-CM

## 2022-05-29 NOTE — Progress Notes (Signed)
Subjective:  Candice Kane is a 36 y.o. [phone] at 69w3dbeing seen today for ongoing prenatal care.  She is currently monitored for the following issues for this high-risk pregnancy and has Varicose veins of lower extremities with other complications; Goiter; Lipoma of back; Supervision of normal pregnancy; Diabetes mellitus during pregnancy in second trimester; Advanced maternal age in multigravida; Maternal obesity affecting pregnancy, antepartum; and Anemia in pregnancy, third trimester on their problem list.  Patient reports general discomforts of pregnancy.  Contractions: Irritability. Vag. Bleeding: None.  Movement: Present. Denies leaking of fluid.   The following portions of the patient's history were reviewed and updated as appropriate: allergies, current medications, past family history, past medical history, past social history, past surgical history and problem list. Problem list updated.  Objective:   Vitals:   05/29/22 1400  BP: 112/74  Pulse: 85  Weight: (!) 318 lb 12.8 oz (144.6 kg)    Fetal Status: Fetal Heart Rate (bpm): 142   Movement: Present     General:  Alert, oriented and cooperative. Patient is in no acute distress.  Skin: Skin is warm and dry. No rash noted.   Cardiovascular: Normal heart rate noted  Respiratory: Normal respiratory effort, no problems with respiration noted  Abdomen: Soft, gravid, appropriate for gestational age. Pain/Pressure: Present     Pelvic:  Cervical exam deferred        Extremities: Normal range of motion.  Edema: Trace  Mental Status: Normal mood and affect. Normal behavior. Normal judgment and thought content.   Urinalysis:      Assessment and Plan:  Pregnancy: G4P1021 at 337w3d1. Encounter for supervision of other normal pregnancy in third trimester Stable GBS and vaginal cultures next week   2. Maternal obesity affecting pregnancy, antepartum Stable Serial growth scans as per MFM  3. Gestational diabetes mellitus (GDM) in  second trimester controlled on oral hypoglycemic drug CBG's in goal range Continue with current management Serial growth scans and antenatal testing as per MFM  Preterm labor symptoms and general obstetric precautions including but not limited to vaginal bleeding, contractions, leaking of fluid and fetal movement were reviewed in detail with the patient. Please refer to After Visit Summary for other counseling recommendations.  Return in about 1 week (around 06/05/2022) for OB visit, face to face, MD only.   ErChancy MilroyMD

## 2022-05-29 NOTE — Patient Instructions (Addendum)

## 2022-05-29 NOTE — Progress Notes (Signed)
Pt presents for ROB visit. No concerns at this time.  

## 2022-06-02 ENCOUNTER — Ambulatory Visit: Payer: Medicaid Other | Attending: Maternal & Fetal Medicine | Admitting: *Deleted

## 2022-06-02 ENCOUNTER — Ambulatory Visit: Payer: Medicaid Other | Admitting: *Deleted

## 2022-06-02 VITALS — BP 118/70 | HR 92

## 2022-06-02 DIAGNOSIS — Z3A36 36 weeks gestation of pregnancy: Secondary | ICD-10-CM

## 2022-06-02 DIAGNOSIS — O24415 Gestational diabetes mellitus in pregnancy, controlled by oral hypoglycemic drugs: Secondary | ICD-10-CM | POA: Insufficient documentation

## 2022-06-02 DIAGNOSIS — O99210 Obesity complicating pregnancy, unspecified trimester: Secondary | ICD-10-CM

## 2022-06-02 DIAGNOSIS — O09523 Supervision of elderly multigravida, third trimester: Secondary | ICD-10-CM | POA: Diagnosis not present

## 2022-06-02 NOTE — Procedures (Signed)
Candice Kane September 19, 1986 [redacted]w[redacted]d Fetus A Non-Stress Test Interpretation for 06/02/22  Indication: Gestational Diabetes medication controlled  Fetal Heart Rate A Mode: External Baseline Rate (A): 135 bpm Variability: Minimal, Moderate Accelerations: 15 x 15 Decelerations: None Multiple birth?: No  Uterine Activity Mode: Palpation, Toco Contraction Frequency (min): none Resting Tone Palpated: Relaxed  Interpretation (Fetal Testing) Nonstress Test Interpretation: Reactive Overall Impression: Reassuring for gestational age Comments: Dr. FAnnamaria Bootsreviewed tracing

## 2022-06-05 ENCOUNTER — Other Ambulatory Visit (HOSPITAL_COMMUNITY)
Admission: RE | Admit: 2022-06-05 | Discharge: 2022-06-05 | Disposition: A | Discharge status: Home / Self Care | Payer: Medicaid Other | Source: Ambulatory Visit | Attending: Obstetrics and Gynecology | Admitting: Obstetrics and Gynecology

## 2022-06-05 ENCOUNTER — Ambulatory Visit (INDEPENDENT_AMBULATORY_CARE_PROVIDER_SITE_OTHER): Payer: Medicaid Other | Admitting: Obstetrics and Gynecology

## 2022-06-05 VITALS — BP 116/79 | HR 89 | Wt 320.0 lb

## 2022-06-05 DIAGNOSIS — Z3A36 36 weeks gestation of pregnancy: Secondary | ICD-10-CM | POA: Diagnosis not present

## 2022-06-05 DIAGNOSIS — O99013 Anemia complicating pregnancy, third trimester: Secondary | ICD-10-CM

## 2022-06-05 DIAGNOSIS — Z3483 Encounter for supervision of other normal pregnancy, third trimester: Secondary | ICD-10-CM

## 2022-06-05 DIAGNOSIS — Z6841 Body Mass Index (BMI) 40.0 and over, adult: Secondary | ICD-10-CM | POA: Insufficient documentation

## 2022-06-05 DIAGNOSIS — Z3490 Encounter for supervision of normal pregnancy, unspecified, unspecified trimester: Secondary | ICD-10-CM

## 2022-06-05 DIAGNOSIS — O09523 Supervision of elderly multigravida, third trimester: Secondary | ICD-10-CM

## 2022-06-05 DIAGNOSIS — O24415 Gestational diabetes mellitus in pregnancy, controlled by oral hypoglycemic drugs: Secondary | ICD-10-CM

## 2022-06-05 DIAGNOSIS — O99210 Obesity complicating pregnancy, unspecified trimester: Secondary | ICD-10-CM

## 2022-06-05 NOTE — Progress Notes (Signed)
Pt reports fetal movement with ligament pain. Pt reports fasting BG today 81.

## 2022-06-05 NOTE — Progress Notes (Signed)
   PRENATAL VISIT NOTE  Subjective:  Candice Kane is a 36 y.o. G4P1021 at 82w3dbeing seen today for ongoing prenatal care.  She is currently monitored for the following issues for this high-risk pregnancy and has Varicose veins of lower extremities with other complications; Goiter; Lipoma of back; Supervision of normal pregnancy; Diabetes mellitus during pregnancy in second trimester; Advanced maternal age in multigravida; Maternal obesity affecting pregnancy, antepartum; Anemia in pregnancy, third trimester; and BMI 40.0-44.9, adult ([place] problem [place].  Patient doing well with no acute concerns today. She reports no complaints.  Contractions: Not present. Vag. Bleeding: None.  Movement: Present. Denies leaking of fluid.   The following portions of the patient's history were reviewed and updated as appropriate: allergies, current medications, past family history, past medical history, past social history, past surgical history and problem list. Problem list updated.  Objective:   Vitals:   06/05/22 1616  BP: 116/79  Pulse: 89  Weight: (!) 320 lb (145.2 kg)    Fetal Status: Fetal Heart Rate (bpm): 147 Fundal Height: 37 cm Movement: Present     General:  Alert, oriented and cooperative. Patient is in no acute distress.  Skin: Skin is warm and dry. No rash noted.   Cardiovascular: Normal heart rate noted  Respiratory: Normal respiratory effort, no problems with respiration noted  Abdomen: Soft, gravid, appropriate for gestational age.  Pain/Pressure: Present     Pelvic: Cervical exam performed Dilation: Fingertip Effacement (%): 50 Station: Ballotable  Extremities: Normal range of motion.  Edema: Trace  Mental Status:  Normal mood and affect. Normal behavior. Normal judgment and thought content.   Assessment and Plan:  Pregnancy: G4P1021 at 377w3d1. [redacted] weeks gestation of pregnancy   2. Gestational diabetes mellitus (GDM) in second trimester controlled on oral hypoglycemic  drug FBS: 80-91 PPBS: 83-117  Continue weekly testing IOL at 39 weeks  3. Multigravida of advanced maternal age in third trimester   4. Anemia in pregnancy, third trimester   5. Maternal obesity affecting pregnancy, antepartum   6. BMI 40.0-44.9, adult ([place]  7. Encounter for supervision of other normal pregnancy in third trimester Continue routine prenatal care - Cervicovaginal ancillary only( [place]) - Culture, beta strep (group b only)  Preterm labor symptoms and general obstetric precautions including but not limited to vaginal bleeding, contractions, leaking of fluid and fetal movement were reviewed in detail with the patient.  Please refer to After Visit Summary for other counseling recommendations.   Return in about 1 week (around 06/12/2022) for [facility].   LaLynnda ShieldsMD Faculty Attending Center for [facility]

## 2022-06-09 LAB — CERVICOVAGINAL ANCILLARY ONLY
Chlamydia: NEGATIVE
Comment: NEGATIVE
Comment: NORMAL
Neisseria Gonorrhea: NEGATIVE

## 2022-06-09 LAB — CULTURE, BETA STREP (GROUP B ONLY): Strep Gp B Culture: NEGATIVE

## 2022-06-10 ENCOUNTER — Ambulatory Visit: Payer: Medicaid Other | Attending: Obstetrics and Gynecology | Admitting: *Deleted

## 2022-06-10 ENCOUNTER — Ambulatory Visit: Payer: Medicaid Other | Admitting: *Deleted

## 2022-06-10 VITALS — BP 120/88 | HR 93

## 2022-06-10 DIAGNOSIS — Z3483 Encounter for supervision of other normal pregnancy, third trimester: Secondary | ICD-10-CM

## 2022-06-10 DIAGNOSIS — Z3A37 37 weeks gestation of pregnancy: Secondary | ICD-10-CM | POA: Diagnosis not present

## 2022-06-10 DIAGNOSIS — O24419 Gestational diabetes mellitus in pregnancy, unspecified control: Secondary | ICD-10-CM | POA: Insufficient documentation

## 2022-06-10 DIAGNOSIS — O99210 Obesity complicating pregnancy, unspecified trimester: Secondary | ICD-10-CM

## 2022-06-10 DIAGNOSIS — O24415 Gestational diabetes mellitus in pregnancy, controlled by oral hypoglycemic drugs: Secondary | ICD-10-CM

## 2022-06-10 NOTE — Procedures (Signed)
Candice Kane [birth year] [redacted]w[redacted]d Fetus A Non-Stress Test Interpretation for 06/10/22  Indication: Gestational Diabetes medication controlled  Fetal Heart Rate A Mode: External Baseline Rate (A): 140 bpm Variability: Moderate Accelerations: 15 x 15 Decelerations: None Multiple birth?: No  Uterine Activity Mode: Palpation, Toco Contraction Frequency (min): 1 UC Contraction Quality: Mild Resting Tone Palpated: Relaxed Resting Time: Adequate  Interpretation (Fetal Testing) Nonstress Test Interpretation: Reactive Comments: Dr. SDonalee Citrinreviewed tracing.

## 2022-06-12 ENCOUNTER — Ambulatory Visit (INDEPENDENT_AMBULATORY_CARE_PROVIDER_SITE_OTHER): Payer: Medicaid Other | Admitting: Obstetrics and Gynecology

## 2022-06-12 VITALS — BP 139/92 | HR 116 | Wt 317.0 lb

## 2022-06-12 DIAGNOSIS — O24415 Gestational diabetes mellitus in pregnancy, controlled by oral hypoglycemic drugs: Secondary | ICD-10-CM

## 2022-06-12 DIAGNOSIS — Z3A37 37 weeks gestation of pregnancy: Secondary | ICD-10-CM

## 2022-06-12 DIAGNOSIS — O09523 Supervision of elderly multigravida, third trimester: Secondary | ICD-10-CM

## 2022-06-12 DIAGNOSIS — Z3483 Encounter for supervision of other normal pregnancy, third trimester: Secondary | ICD-10-CM

## 2022-06-12 DIAGNOSIS — O99210 Obesity complicating pregnancy, unspecified trimester: Secondary | ICD-10-CM

## 2022-06-12 DIAGNOSIS — Z6841 Body Mass Index (BMI) 40.0 and over, adult: Secondary | ICD-10-CM

## 2022-06-12 NOTE — Progress Notes (Signed)
ROB 37.[redacted] wks GA No unusual concerns GDM, does not have log, can report value range

## 2022-06-12 NOTE — Progress Notes (Signed)
   PRENATAL VISIT NOTE  Subjective:  Candice Kane is a 36 y.o. [phone] at 62w3dbeing seen today for ongoing prenatal care.  She is currently monitored for the following issues for this high-risk pregnancy and has Varicose veins of lower extremities with other complications; Goiter; Lipoma of back; Supervision of normal pregnancy; Diabetes mellitus during pregnancy in second trimester; Advanced maternal age in multigravida; Maternal obesity affecting pregnancy, antepartum; Anemia in pregnancy, third trimester; and BMI 40.0-44.9, adult ([place] their problem list.  Patient doing well with no acute concerns today. She reports no complaints.  Contractions: Irritability.  .  Movement: Present. Denies leaking of fluid. Of note , the patient's father passed today.  She is appropriately emotional with BP slightly elevated.  No other symptoms and do not believe this is a sign of preeclampsia.  PT given precautions and instructions to return to MAU if BP remains elevated.  The following portions of the patient's history were reviewed and updated as appropriate: allergies, current medications, past family history, past medical history, past social history, past surgical history and problem list. Problem list updated.  Objective:   Vitals:   06/12/22 1401 06/12/22 1405  BP: (!) 134/91 (!) 139/92  Pulse: (!) 112 (!) 116  Weight: (!) 317 lb (143.8 kg)     Fetal Status: Fetal Heart Rate (bpm): 143 Fundal Height: 38 cm Movement: Present     General:  Alert, oriented and cooperative. Patient is in no acute distress.  Skin: Skin is warm and dry. No rash noted.   Cardiovascular: Normal heart rate noted  Respiratory: Normal respiratory effort, no problems with respiration noted  Abdomen: Soft, gravid, appropriate for gestational age.  Pain/Pressure: Present     Pelvic: Cervical exam performed Dilation: Fingertip Effacement (%): 50 Station: Ballotable  Extremities: Normal range of motion.  Edema: Trace   Mental Status:  Normal mood and affect. Normal behavior. Normal judgment and thought content.   Assessment and Plan:  Pregnancy: G4P1021 at 367w3d1. [redacted] weeks gestation of pregnancy   2. Gestational diabetes mellitus (GDM) in second trimester controlled on oral hypoglycemic drug Pt did not brings values, but notes they have remained mostly in normal range  3. Encounter for supervision of other normal pregnancy in third trimester Continue routine care, IOL scheduled for 06/23/22  4. Multigravida of advanced maternal age in third trimester   5. Maternal obesity affecting pregnancy, antepartum   6. BMI 40.0-44.9, adult ([facility]  Term labor symptoms and general obstetric precautions including but not limited to vaginal bleeding, contractions, leaking of fluid and fetal movement were reviewed in detail with the patient.  Please refer to After Visit Summary for other counseling recommendations.   Return in about 1 week (around 06/19/2022) for [facility].   LaLynnda ShieldsMD Faculty Attending Center for [facility]

## 2022-06-13 ENCOUNTER — Other Ambulatory Visit: Payer: Self-pay | Admitting: *Deleted

## 2022-06-13 DIAGNOSIS — O24415 Gestational diabetes mellitus in pregnancy, controlled by oral hypoglycemic drugs: Secondary | ICD-10-CM

## 2022-06-13 DIAGNOSIS — O09523 Supervision of elderly multigravida, third trimester: Secondary | ICD-10-CM

## 2022-06-13 DIAGNOSIS — Z6839 Body mass index (BMI) 39.0-39.9, adult: Secondary | ICD-10-CM

## 2022-06-13 DIAGNOSIS — O43199 Other malformation of placenta, unspecified trimester: Secondary | ICD-10-CM

## 2022-06-14 ENCOUNTER — Encounter: Payer: Self-pay | Admitting: Obstetrics and Gynecology

## 2022-06-14 ENCOUNTER — Encounter (HOSPITAL_COMMUNITY): Payer: Self-pay | Admitting: Obstetrics and Gynecology

## 2022-06-14 ENCOUNTER — Inpatient Hospital Stay (HOSPITAL_COMMUNITY)
Admission: AD | Admit: 2022-06-14 | Discharge: 2022-06-17 | DRG: 807 | Disposition: A | Discharge status: Home / Self Care | Payer: Medicaid Other | Attending: Obstetrics and Gynecology | Admitting: Obstetrics and Gynecology

## 2022-06-14 ENCOUNTER — Other Ambulatory Visit: Payer: Self-pay

## 2022-06-14 DIAGNOSIS — O134 Gestational [pregnancy-induced] hypertension without significant proteinuria, complicating childbirth: Secondary | ICD-10-CM | POA: Diagnosis present

## 2022-06-14 DIAGNOSIS — Z7982 Long term (current) use of aspirin: Secondary | ICD-10-CM | POA: Diagnosis not present

## 2022-06-14 DIAGNOSIS — O9902 Anemia complicating childbirth: Secondary | ICD-10-CM | POA: Diagnosis present

## 2022-06-14 DIAGNOSIS — O24415 Gestational diabetes mellitus in pregnancy, controlled by oral hypoglycemic drugs: Secondary | ICD-10-CM | POA: Diagnosis present

## 2022-06-14 DIAGNOSIS — Z3A37 37 weeks gestation of pregnancy: Secondary | ICD-10-CM | POA: Diagnosis not present

## 2022-06-14 DIAGNOSIS — O24425 Gestational diabetes mellitus in childbirth, controlled by oral hypoglycemic drugs: Secondary | ICD-10-CM | POA: Diagnosis present

## 2022-06-14 DIAGNOSIS — O139 Gestational [pregnancy-induced] hypertension without significant proteinuria, unspecified trimester: Secondary | ICD-10-CM

## 2022-06-14 DIAGNOSIS — D563 Thalassemia minor: Secondary | ICD-10-CM | POA: Diagnosis present

## 2022-06-14 DIAGNOSIS — D509 Iron deficiency anemia, unspecified: Secondary | ICD-10-CM | POA: Diagnosis present

## 2022-06-14 DIAGNOSIS — O09513 Supervision of elderly primigravida, third trimester: Secondary | ICD-10-CM | POA: Diagnosis not present

## 2022-06-14 DIAGNOSIS — O99214 Obesity complicating childbirth: Secondary | ICD-10-CM | POA: Diagnosis present

## 2022-06-14 DIAGNOSIS — O24424 Gestational diabetes mellitus in childbirth, insulin controlled: Secondary | ICD-10-CM | POA: Diagnosis not present

## 2022-06-14 DIAGNOSIS — Z87891 Personal history of nicotine dependence: Secondary | ICD-10-CM | POA: Diagnosis not present

## 2022-06-14 DIAGNOSIS — O99210 Obesity complicating pregnancy, unspecified trimester: Secondary | ICD-10-CM

## 2022-06-14 DIAGNOSIS — Z3483 Encounter for supervision of other normal pregnancy, third trimester: Principal | ICD-10-CM

## 2022-06-14 DIAGNOSIS — Z6841 Body Mass Index (BMI) 40.0 and over, adult: Secondary | ICD-10-CM

## 2022-06-14 DIAGNOSIS — Z3490 Encounter for supervision of normal pregnancy, unspecified, unspecified trimester: Secondary | ICD-10-CM

## 2022-06-14 DIAGNOSIS — O09529 Supervision of elderly multigravida, unspecified trimester: Secondary | ICD-10-CM

## 2022-06-14 LAB — CBC
HCT: 31.6 % — ABNORMAL LOW (ref 36.0–46.0)
Hemoglobin: 10.3 g/dL — ABNORMAL LOW (ref 12.0–15.0)
MCH: 26.8 pg (ref 26.0–34.0)
MCHC: 32.6 g/dL (ref 30.0–36.0)
MCV: 82.1 fL (ref 80.0–100.0)
Platelets: 470 10*3/uL — ABNORMAL HIGH (ref 150–400)
RBC: 3.85 MIL/uL — ABNORMAL LOW (ref 3.87–5.11)
RDW: 16.6 % — ABNORMAL HIGH (ref 11.5–15.5)
WBC: 9 10*3/uL (ref 4.0–10.5)
nRBC: 0 % (ref 0.0–0.2)

## 2022-06-14 LAB — COMPREHENSIVE METABOLIC PANEL
ALT: 8 U/L (ref 0–44)
AST: 11 U/L — ABNORMAL LOW (ref 15–41)
Albumin: 2.8 g/dL — ABNORMAL LOW (ref 3.5–5.0)
Alkaline Phosphatase: 137 U/L — ABNORMAL HIGH (ref 38–126)
Anion gap: 10 (ref 5–15)
BUN: 6 mg/dL (ref 6–20)
CO2: 20 mmol/L — ABNORMAL LOW (ref 22–32)
Calcium: 8.7 mg/dL — ABNORMAL LOW (ref 8.9–10.3)
Chloride: 105 mmol/L (ref 98–111)
Creatinine, Ser: 0.59 mg/dL (ref 0.44–1.00)
GFR, Estimated: >60 mL/min (ref >60)
Glucose, Bld: 83 mg/dL (ref 70–99)
Potassium: 3.9 mmol/L (ref 3.5–5.1)
Sodium: 135 mmol/L (ref 135–145)
Total Bilirubin: 0.3 mg/dL (ref 0.3–1.2)
Total Protein: 6.4 g/dL — ABNORMAL LOW (ref 6.5–8.1)

## 2022-06-14 LAB — URINALYSIS, ROUTINE W REFLEX MICROSCOPIC
Bilirubin Urine: NEGATIVE
Glucose, UA: NEGATIVE mg/dL
Hgb urine dipstick: NEGATIVE
Ketones, ur: NEGATIVE mg/dL
Nitrite: NEGATIVE
Protein, ur: 30 mg/dL — AB
Specific Gravity, Urine: 1.024 (ref 1.005–1.030)
Squamous Epithelial / HPF: >50 — ABNORMAL HIGH (ref 0–5)
pH: 5 (ref 5.0–8.0)

## 2022-06-14 LAB — TYPE AND SCREEN
ABO/RH(D): O POS
Antibody Screen: NEGATIVE

## 2022-06-14 LAB — PROTEIN / CREATININE RATIO, URINE
Creatinine, Urine: 233 mg/dL
Protein Creatinine Ratio: 0.12 mg/mg{Cre} (ref 0.00–0.15)
Total Protein, Urine: 27 mg/dL

## 2022-06-14 LAB — GLUCOSE, CAPILLARY: Glucose-Capillary: 88 mg/dL (ref 70–99)

## 2022-06-14 MED ORDER — OXYCODONE-ACETAMINOPHEN 5-325 MG PO TABS: 1.0000 | ORAL_TABLET | ORAL | Status: DC | PRN

## 2022-06-14 MED ORDER — ACETAMINOPHEN-CAFFEINE 500-65 MG PO TABS
2.0000 | ORAL_TABLET | Freq: Once | ORAL | Status: AC
  Administered 2022-06-14: 2 via ORAL
  Filled 2022-06-14: qty 2

## 2022-06-14 MED ORDER — LACTATED RINGERS IV SOLN: Freq: Once | INTRAVENOUS | Status: AC

## 2022-06-14 MED ORDER — MISOPROSTOL 25 MCG QUARTER TABLET: 25.0000 ug | ORAL_TABLET | ORAL | Status: DC | PRN

## 2022-06-14 MED ORDER — LIDOCAINE HCL (PF) 1 % IJ SOLN: 30.0000 mL | INTRAMUSCULAR | Status: DC | PRN

## 2022-06-14 MED ORDER — OXYTOCIN BOLUS FROM INFUSION
333.0000 mL | Freq: Once | INTRAVENOUS | Status: AC
  Administered 2022-06-15: 333 mL via INTRAVENOUS

## 2022-06-14 MED ORDER — MISOPROSTOL 50MCG HALF TABLET
50.0000 ug | ORAL_TABLET | ORAL | Status: DC | PRN
  Administered 2022-06-14: 50 ug via BUCCAL
  Filled 2022-06-14: qty 1

## 2022-06-14 MED ORDER — ONDANSETRON HCL 4 MG/2ML IJ SOLN: 4.0000 mg | Freq: Four times a day (QID) | INTRAMUSCULAR | Status: DC | PRN

## 2022-06-14 MED ORDER — OXYTOCIN-SODIUM CHLORIDE 30-0.9 UT/500ML-% IV SOLN
1.0000 m[IU]/min | INTRAVENOUS | Status: DC
  Administered 2022-06-15: 2 m[IU]/min via INTRAVENOUS
  Filled 2022-06-14: qty 500

## 2022-06-14 MED ORDER — LACTATED RINGERS IV SOLN: 500.0000 mL | INTRAVENOUS | Status: DC | PRN

## 2022-06-14 MED ORDER — FENTANYL CITRATE (PF) 100 MCG/2ML IJ SOLN
50.0000 ug | INTRAMUSCULAR | Status: DC | PRN
  Administered 2022-06-15 (×2): 100 ug via INTRAVENOUS
  Filled 2022-06-14 (×2): qty 2

## 2022-06-14 MED ORDER — OXYTOCIN-SODIUM CHLORIDE 30-0.9 UT/500ML-% IV SOLN: 2.5000 [IU]/h | INTRAVENOUS | Status: DC

## 2022-06-14 MED ORDER — LACTATED RINGERS IV SOLN
INTRAVENOUS | Status: DC
  Administered 2022-06-14: 600 mL via INTRAVENOUS
  Administered 2022-06-15: 1000 mL via INTRAVENOUS

## 2022-06-14 MED ORDER — TERBUTALINE SULFATE 1 MG/ML IJ SOLN: 0.2500 mg | Freq: Once | INTRAMUSCULAR | Status: DC | PRN

## 2022-06-14 MED ORDER — ACETAMINOPHEN 325 MG PO TABS: 650.0000 mg | ORAL_TABLET | ORAL | Status: DC | PRN

## 2022-06-14 MED ORDER — SOD CITRATE-CITRIC ACID 500-334 MG/5ML PO SOLN: 30.0000 mL | ORAL | Status: DC | PRN

## 2022-06-14 NOTE — MAU Provider Note (Signed)
History     CSN: [id number]  Arrival date and time: 06/14/22 1738   Event Date/Time   First Provider Initiated Contact with Patient 06/14/22 1842      Chief Complaint  Patient presents with   BP Evaluation   HPI Candice Kane is a 36 y.o. G4P1021 at 49w5dwho presents to MAU for evaluation of elevated blood pressure and headache. Patient has had a headache since this morning. She attempted management with Tylenol at noon and did not experience relief. On arrival to MAU she reports pain score of 7/10. She denies visual disturbances, RUQ pain, new onset swelling or weight gain. She denies vaginal bleeding, leaking of fluid, decreased fetal movement, fever, falls, or recent illness.   Patient receives care with [facility]  OB History     Gravida  4   Para  1   Term  1   Preterm      AB  2   Living  1      SAB  1   IAB      Ectopic      Multiple      Live Births  1           Past Medical History:  Diagnosis Date   Anemia    Bronchitis    recent occurance   Chest pain    COPD [place] disease) [place]    Cough    Goiter 03/01/2013   History of chlamydia 12/01/2009   Lump of skin of back    Lower Right side - Lipoma   Sciatica of right side    Varicose veins     Past Surgical History:  Procedure Laterality Date   ADENOIDECTOMY     BACK SURGERY  08/14/2021   TONSILLECTOMY      Family History  Problem Relation Age of Onset   Cancer Mother        breast   Kidney failure Mother    Diabetes Father    Hyperlipidemia Father    Hypertension Father    Colon cancer Maternal Grandmother        colon   COPD Maternal Grandfather     Social History   Tobacco Use   Smoking status: Former    Types: Cigars   Smokeless tobacco: Never  Vaping Use   Vaping Use: Never used  Substance Use Topics   Alcohol use: Not Currently    Comment: rare, not since confirmed pregnancy   Drug use: No    Allergies: No Known  Allergies  Medications Prior to Admission  Medication Sig Dispense Refill Last Dose   acetaminophen (TYLENOL) 500 MG tablet Take 2 tablets (1,000 mg total) by mouth every 8 (eight) hours as needed for moderate pain. 30 tablet 0 06/14/2022 at 1245   aspirin 81 MG chewable tablet Chew 1 tablet (81 mg total) by mouth daily. 30 tablet 7 Past Month   metFORMIN (GLUCOPHAGE) 500 MG tablet Take 1 tablet (500 mg total) by mouth at bedtime. 30 tablet 5 06/13/2022   prenatal vitamin w/FE, FA (PRENATAL 1 + 1) 27-1 MG TABS tablet Take 1 tablet by mouth daily at 12 noon.   06/14/2022   Accu-Chek Softclix Lancets lancets Check capillary blood sugars 4x's daily 100 each 12    Blood Glucose Monitoring Suppl (ACCU-CHEK GUIDE) w/Device KIT Check capillary blood sugars 4x's daily 1 kit 0    Blood Pressure Monitoring (BLOOD PRESSURE KIT) DEVI 1 kit by Does not apply route  once a week. 1 each 0    glucose blood (ACCU-CHEK GUIDE) test strip Use to check blood sugars four times a day was instructed 100 each 12    Iron Polysacch Cmplx-B12-FA 150-0.025-1 MG CAPS Take 1 capsule by mouth every other day. 30 capsule 5    Misc. Devices (GOJJI WEIGHT SCALE) MISC 1 Device by Does not apply route every 30 (thirty) days. 1 each 0     Review of Systems  Neurological:  Positive for headaches.  All other systems reviewed and are negative.  Physical Exam   Blood pressure 125/78, pulse 84, temperature 99.1 F (37.3 C), temperature source Oral, resp. rate 19, height 6' 1"  (1.854 m), weight (!) 145.6 kg, last menstrual period 09/23/2021, SpO2 98 %, unknown if currently breastfeeding.  Physical Exam Vitals and nursing note reviewed. Exam conducted with a chaperone present.  Constitutional:      Appearance: Normal appearance. She is obese.  Cardiovascular:     Rate and Rhythm: Normal rate and regular rhythm.     Pulses: Normal pulses.     Heart sounds: Normal heart sounds.  Pulmonary:     Effort: Pulmonary effort is normal.      Breath sounds: Normal breath sounds.  Abdominal:     Comments: Gravid  Neurological:     Mental Status: She is alert.     MAU Course  Procedures  MDM Orders Placed This Encounter  Procedures   Protein / creatinine ratio, urine   Urinalysis, Routine w reflex microscopic Urine, Clean Catch   CBC   Comprehensive metabolic panel   RPR   Measure blood pressure   Type and screen [place]   Insert peripheral IV   Patient Vitals for the past 24 hrs:  BP Temp Temp src Pulse Resp SpO2 Height Weight  06/14/22 1931 118/78 -- -- 86 -- -- -- --  06/14/22 1916 121/80 -- -- 85 -- -- -- --  06/14/22 1904 -- -- -- -- -- 99 % -- --  06/14/22 1903 129/76 -- -- 86 -- -- -- --  06/14/22 1849 133/81 -- -- 90 -- -- -- --  06/14/22 1836 125/78 -- -- 84 -- -- -- --  06/14/22 1822 129/81 -- -- 94 -- -- -- --  06/14/22 1820 -- -- -- -- -- 98 % -- --  06/14/22 1815 -- -- -- -- -- 97 % -- --  06/14/22 1752 (!) 142/87 99.1 F (37.3 C) Oral 95 19 99 % -- --  06/14/22 1748 -- -- -- -- -- -- 6' 1"  (1.854 m) (!) 145.6 kg   Results for orders placed or performed during the hospital encounter of 06/14/22 (from the past 24 hour(s))  Protein / creatinine ratio, urine     Status: None   Collection Time: 06/14/22  5:50 PM  Result Value Ref Range   Creatinine, Urine 233 mg/dL   Total Protein, Urine 27 mg/dL   Protein Creatinine Ratio 0.12 0.00 - 0.15 mg/mg[Cre]  Urinalysis, Routine w reflex microscopic Urine, Clean Catch     Status: Abnormal   Collection Time: 06/14/22  5:56 PM  Result Value Ref Range   Color, Urine YELLOW YELLOW   APPearance CLOUDY (A) CLEAR   Specific Gravity, Urine 1.024 1.005 - 1.030   pH 5.0 5.0 - 8.0   Glucose, UA NEGATIVE NEGATIVE mg/dL   Hgb urine dipstick NEGATIVE NEGATIVE   Bilirubin Urine NEGATIVE NEGATIVE   Ketones, ur NEGATIVE NEGATIVE mg/dL   Protein,  ur 30 (A) NEGATIVE mg/dL   Nitrite NEGATIVE NEGATIVE   Leukocytes,Ua SMALL (A) NEGATIVE   RBC /  HPF 0-5 0 - 5 RBC/hpf   WBC, UA 6-10 0 - 5 WBC/hpf   Bacteria, UA RARE (A) NONE SEEN   Squamous Epithelial / LPF >50 (H) 0 - 5   Mucus PRESENT    Amorphous Crystal PRESENT   CBC     Status: Abnormal   Collection Time: 06/14/22  6:37 PM  Result Value Ref Range   WBC 9.0 4.0 - 10.5 K/uL   RBC 3.85 (L) 3.87 - 5.11 MIL/uL   Hemoglobin 10.3 (L) 12.0 - 15.0 g/dL   HCT 31.6 (L) 36.0 - 46.0 %   MCV 82.1 80.0 - 100.0 fL   MCH 26.8 26.0 - 34.0 pg   MCHC 32.6 30.0 - 36.0 g/dL   RDW 16.6 (H) 11.5 - 15.5 %   Platelets 470 (H) 150 - 400 K/uL   nRBC 0.0 0.0 - 0.2 %  Comprehensive metabolic panel     Status: Abnormal   Collection Time: 06/14/22  6:37 PM  Result Value Ref Range   Sodium 135 135 - 145 mmol/L   Potassium 3.9 3.5 - 5.1 mmol/L   Chloride 105 98 - 111 mmol/L   CO2 20 (L) 22 - 32 mmol/L   Glucose, Bld 83 70 - 99 mg/dL   BUN 6 6 - 20 mg/dL   Creatinine, Ser 0.59 0.44 - 1.00 mg/dL   Calcium 8.7 (L) 8.9 - 10.3 mg/dL   Total Protein 6.4 (L) 6.5 - 8.1 g/dL   Albumin 2.8 (L) 3.5 - 5.0 g/dL   AST 11 (L) 15 - 41 U/L   ALT 8 0 - 44 U/L   Alkaline Phosphatase 137 (H) 38 - 126 U/L   Total Bilirubin 0.3 0.3 - 1.2 mg/dL   GFR, Estimated >60 >60 mL/min   Anion gap 10 5 - 15  Type and screen [place]     Status: None   Collection Time: 06/14/22  6:37 PM  Result Value Ref Range   ABO/RH(D) O POS    Antibody Screen NEG    Sample Expiration      06/17/2022,2359 Performed at [facility], [address] [place], [place] [address]     Meds ordered this encounter  Medications   acetaminophen-caffeine (EXCEDRIN TENSION HEADACHE) 500-65 MG per tablet 2 tablet   lactated ringers infusion    Assessment and Plan  --36 y.o. [id number] at [redacted]w[redacted]d--Gestational Hypertension --A2GDM (Metformin) --Normal PEC labs --Reactive tracing --Vertex confirmed with bedside ultrasound --Per Dr. DDamita Dunnings admit to L&D for IOL, report called to Dr. MFoye Deer [facility] [place]  MSN, CNM 06/14/2022, 8:04 PM

## 2022-06-14 NOTE — H&P (Addendum)
OBSTETRIC ADMISSION HISTORY AND PHYSICAL  Maurice Ramseur is a 36 y.o. female [phone] with IUP at 68w5dby LMP presenting for IOL for gHTN. She reports +FMs, no LOF, no VB, no blurry vision, peripheral edema, or RUQ pain. Does report a headache that was treated in the MAU with Excedrin, now resolved. She plans on breast feeding. She is undecided for birth control.  [facility]  Dating: By LMP --->  Estimated Date of Delivery: 06/30/22  Sono:   @[redacted]w[redacted]d , CWD, normal anatomy, cephalic presentation, posterior lie, 2151g, 48% EFW  Prenatal History/Complications:  - AT1XBWon Metformin 500 daily - Silent carrier alpha thalassemia  - AMA - Maternal obesity BMI 42 - Iron deficiency anemia  Past Medical History: Past Medical History:  Diagnosis Date   Anemia    Bronchitis    recent occurance   Chest pain    COPD (chronic obstructive pulmonary disease) ([place]    Cough    Goiter 03/01/2013   History of chlamydia 12/01/2009   Lump of skin of back    Lower Right side - Lipoma   Sciatica of right side    Varicose veins     Past Surgical History: Past Surgical History:  Procedure Laterality Date   ADENOIDECTOMY     BACK SURGERY  08/14/2021   TONSILLECTOMY      Obstetrical History: OB History     Gravida  4   Para  1   Term  1   Preterm      AB  2   Living  1      SAB  1   IAB      Ectopic      Multiple      Live Births  1           Social History Social History   Socioeconomic History   Marital status: Single    Spouse name: Not on file   Number of children: Not on file   Years of education: Not on file   Highest education level: Not on file  Occupational History   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 [place] Tobacco Use   Smoking status: Former    Types: Cigars   Smokeless tobacco: Never  Vaping Use   Vaping Use: Never used  Substance and Sexual Activity   Alcohol use:  Not Currently    Comment: rare, not since confirmed pregnancy   Drug use: No   Sexual activity: Yes    Partners: Male    Birth control/protection: None  Other Topics Concern   Not on file  Social History Narrative   Exercise-- yes, walking 3x a week   Social Determinants of Health   Financial Resource Strain: Not on file  Food Insecurity: Not on file  Transportation Needs: Not on file  Physical Activity: Not on file  Stress: Not on file  Social Connections: Not on file    Family History: Family History  Problem Relation Age of Onset   Cancer Mother        breast   Kidney failure Mother    Diabetes Father    Hyperlipidemia Father    Hypertension Father    Colon cancer Maternal Grandmother        colon   COPD Maternal Grandfather     Allergies: No Known Allergies  Medications Prior to Admission  Medication Sig Dispense Refill Last Dose   acetaminophen (  TYLENOL) 500 MG tablet Take 2 tablets (1,000 mg total) by mouth every 8 (eight) hours as needed for moderate pain. 30 tablet 0 06/14/2022 at 1245   aspirin 81 MG chewable tablet Chew 1 tablet (81 mg total) by mouth daily. 30 tablet 7 Past Month   metFORMIN (GLUCOPHAGE) 500 MG tablet Take 1 tablet (500 mg total) by mouth at bedtime. 30 tablet 5 06/13/2022   prenatal vitamin w/FE, FA (PRENATAL 1 + 1) 27-1 MG TABS tablet Take 1 tablet by mouth daily at 12 noon.   06/14/2022   Accu-Chek Softclix Lancets lancets Check capillary blood sugars 4x's daily 100 each 12    Blood Glucose Monitoring Suppl (ACCU-CHEK GUIDE) w/Device KIT Check capillary blood sugars 4x's daily 1 kit 0    Blood Pressure Monitoring (BLOOD PRESSURE KIT) DEVI 1 kit by Does not apply route once a week. 1 each 0    glucose blood (ACCU-CHEK GUIDE) test strip Use to check blood sugars four times a day was instructed 100 each 12    Iron Polysacch Cmplx-B12-FA 150-0.025-1 MG CAPS Take 1 capsule by mouth every other day. 30 capsule 5    Misc. Devices (GOJJI WEIGHT  SCALE) MISC 1 Device by Does not apply route every 30 (thirty) days. 1 each 0      Review of Systems  All systems reviewed and negative except as stated in HPI  Blood pressure 118/78, pulse 86, temperature 99.1 F (37.3 C), temperature source Oral, resp. rate 19, height 6' 1"  (1.854 m), weight (!) 145.6 kg, last menstrual period 09/23/2021, SpO2 99 %, unknown if currently breastfeeding.  General appearance: alert, cooperative, and no distress Lungs: normal work of breathing on room air  Heart: normal rate, warm and well perfused  Abdomen: soft, non-tender, gravid  Extremities: no LE edema or calf tenderness to palpation   Presentation: Cephalic Fetal monitoring: Baseline: 150 bpm, moderate variability, + accels, no decels  Uterine activity: Rare CTXs Dilation: 1.5 Effacement (%): 50 Station: -3 Exam by:: Fabiola Backer MD  Prenatal labs: ABO, Rh: --/--/O POS (07/15 1837) Antibody: NEG (07/15 1837) Rubella: 0.97 (01/17 1604) RPR: Non Reactive (05/18 1518)  HBsAg: Negative (01/17 1604)  HIV: Non Reactive (05/18 1518)  GBS: Negative/-- (07/06 1650)  Hgb A1c 6.6% Genetic screening: Low risk NIPS, AFP normal, Horizon with silent alpha thalassemia carrier  Anatomy US normal female  Prenatal Transfer Tool  Maternal Diabetes: Yes:  Diabetes Type:  Insulin/Medication controlled Genetic Screening: As above  Maternal Ultrasounds/Referrals: Normal Fetal Ultrasounds or other Referrals:  Fetal echo, Referred to Materal Fetal Medicine  Maternal Substance Abuse:  No Significant Maternal Medications:  None Significant Maternal Lab Results: Group B Strep negative  Results for orders placed or performed during the hospital encounter of 06/14/22 (from the past 24 hour(s))  Protein / creatinine ratio, urine   Collection Time: 06/14/22  5:50 PM  Result Value Ref Range   Creatinine, Urine 233 mg/dL   Total Protein, Urine 27 mg/dL   Protein Creatinine Ratio 0.12 0.00 - 0.15 mg/mg[Cre]   Urinalysis, Routine w reflex microscopic Urine, Clean Catch   Collection Time: 06/14/22  5:56 PM  Result Value Ref Range   Color, Urine YELLOW YELLOW   APPearance CLOUDY (A) CLEAR   Specific Gravity, Urine 1.024 1.005 - 1.030   pH 5.0 5.0 - 8.0   Glucose, UA NEGATIVE NEGATIVE mg/dL   Hgb urine dipstick NEGATIVE NEGATIVE   Bilirubin Urine NEGATIVE NEGATIVE   Ketones, ur NEGATIVE NEGATIVE mg/dL  Protein, ur 30 (A) NEGATIVE mg/dL   Nitrite NEGATIVE NEGATIVE   Leukocytes,Ua SMALL (A) NEGATIVE   RBC / HPF 0-5 0 - 5 RBC/hpf   WBC, UA 6-10 0 - 5 WBC/hpf   Bacteria, UA RARE (A) NONE SEEN   Squamous Epithelial / LPF >50 (H) 0 - 5   Mucus PRESENT    Amorphous Crystal PRESENT   CBC   Collection Time: 06/14/22  6:37 PM  Result Value Ref Range   WBC 9.0 4.0 - 10.5 K/uL   RBC 3.85 (L) 3.87 - 5.11 MIL/uL   Hemoglobin 10.3 (L) 12.0 - 15.0 g/dL   HCT 31.6 (L) 36.0 - 46.0 %   MCV 82.1 80.0 - 100.0 fL   MCH 26.8 26.0 - 34.0 pg   MCHC 32.6 30.0 - 36.0 g/dL   RDW 16.6 (H) 11.5 - 15.5 %   Platelets 470 (H) 150 - 400 K/uL   nRBC 0.0 0.0 - 0.2 %  Comprehensive metabolic panel   Collection Time: 06/14/22  6:37 PM  Result Value Ref Range   Sodium 135 135 - 145 mmol/L   Potassium 3.9 3.5 - 5.1 mmol/L   Chloride 105 98 - 111 mmol/L   CO2 20 (L) 22 - 32 mmol/L   Glucose, Bld 83 70 - 99 mg/dL   BUN 6 6 - 20 mg/dL   Creatinine, Ser 0.59 0.44 - 1.00 mg/dL   Calcium 8.7 (L) 8.9 - 10.3 mg/dL   Total Protein 6.4 (L) 6.5 - 8.1 g/dL   Albumin 2.8 (L) 3.5 - 5.0 g/dL   AST 11 (L) 15 - 41 U/L   ALT 8 0 - 44 U/L   Alkaline Phosphatase 137 (H) 38 - 126 U/L   Total Bilirubin 0.3 0.3 - 1.2 mg/dL   GFR, Estimated >60 >60 mL/min   Anion gap 10 5 - 15  Type and screen [place]   Collection Time: 06/14/22  6:37 PM  Result Value Ref Range   ABO/RH(D) O POS    Antibody Screen NEG    Sample Expiration      06/17/2022,2359 Performed at [facility], [address]  [place], [place] [address]   Glucose, capillary   Collection Time: 06/14/22  8:28 PM  Result Value Ref Range   Glucose-Capillary 88 70 - 99 mg/dL    Patient Active Problem List   Diagnosis Date Noted   Gestational diabetes mellitus (GDM) controlled on oral hypoglycemic drug 06/14/2022   BMI 40.0-44.9, adult ([place]) 06/05/2022   Anemia in pregnancy, third trimester 04/21/2022   Maternal obesity affecting pregnancy, antepartum 03/27/2022   Advanced maternal age in multigravida 02/13/2022   Diabetes mellitus during pregnancy in second trimester 01/28/2022   Supervision of normal pregnancy 11/26/2021   Varicose veins of lower extremities with other complications 37/62/8315   Goiter 03/10/2013   Lipoma of back 03/10/2013    Assessment/Plan:  Briseyda Fehr is a 35 y.o. V7O1607 at 64w5dhere for IOL due to [place]  #Labor: Discussed all methods for induction. Will start with buccal Cytotec. Recheck in 4 hrs.  #Pain: PRN; desires epidural #FWB: Cat 1 #ID:  GBS negative #MOF: Breast #MOC: Undecided #Circ:  Desired  #gHTN: BP normal to mild range. Prior headache resolved. POgdenlabs normal. Will continue to monitor.   #A2GDM: On Metformin. Will check CBGs every 4 hours in latent phase. EFW 48% at 33 weeks.   AFabiola Backer MD  06/14/2022, 8:32 PM  GME ATTESTATION:  I saw and evaluated the patient. I agree with the findings and the plan of care as documented in the resident's note. I have made changes to documentation as necessary.  IOL initiated with buccal Cytotec. Will reassess in 4 hours. gHTN with stable BP and labs. CBG within normal limits. Will continue to monitor. Cat 1, reactive FHT.   Vilma Meckel, MD OB Fellow, [facility] [place] 06/15/2022 12:28 AM

## 2022-06-14 NOTE — MAU Note (Signed)
Candice Kane is a 36 y.o. at 61w5dhere in MAU reporting: elevated BP today when taken @ home.  Currently has HA despite taking Tylenol @ 1200 this afternoon.  Denies visual disturbances and epigastric pain.  Endorses +FM.  Denies VB or LOF.  Onset of complaint: today Pain score: 7/10 HA Vitals:   06/14/22 1752  BP: (!) 142/87  Pulse: 95  Resp: 19  Temp: 99.1 F (37.3 C)  SpO2: 99%     FHT:145 bpm Lab orders placed from triage:   UA

## 2022-06-15 ENCOUNTER — Inpatient Hospital Stay (HOSPITAL_COMMUNITY): Payer: Medicaid Other | Admitting: Anesthesiology

## 2022-06-15 ENCOUNTER — Encounter (HOSPITAL_COMMUNITY): Payer: Self-pay | Admitting: Obstetrics and Gynecology

## 2022-06-15 DIAGNOSIS — O09513 Supervision of elderly primigravida, third trimester: Secondary | ICD-10-CM

## 2022-06-15 DIAGNOSIS — D563 Thalassemia minor: Secondary | ICD-10-CM

## 2022-06-15 DIAGNOSIS — O24424 Gestational diabetes mellitus in childbirth, insulin controlled: Secondary | ICD-10-CM

## 2022-06-15 DIAGNOSIS — O134 Gestational [pregnancy-induced] hypertension without significant proteinuria, complicating childbirth: Secondary | ICD-10-CM

## 2022-06-15 DIAGNOSIS — O139 Gestational [pregnancy-induced] hypertension without significant proteinuria, unspecified trimester: Secondary | ICD-10-CM

## 2022-06-15 DIAGNOSIS — Z3A37 37 weeks gestation of pregnancy: Secondary | ICD-10-CM

## 2022-06-15 HISTORY — DX: Thalassemia minor: D56.3

## 2022-06-15 LAB — COMPREHENSIVE METABOLIC PANEL
ALT: 10 U/L (ref 0–44)
AST: 16 U/L (ref 15–41)
Albumin: 2.6 g/dL — ABNORMAL LOW (ref 3.5–5.0)
Alkaline Phosphatase: 135 U/L — ABNORMAL HIGH (ref 38–126)
Anion gap: 14 (ref 5–15)
BUN: 6 mg/dL (ref 6–20)
CO2: 20 mmol/L — ABNORMAL LOW (ref 22–32)
Calcium: 9 mg/dL (ref 8.9–10.3)
Chloride: 101 mmol/L (ref 98–111)
Creatinine, Ser: 0.62 mg/dL (ref 0.44–1.00)
GFR, Estimated: >60 mL/min (ref >60)
Glucose, Bld: 101 mg/dL — ABNORMAL HIGH (ref 70–99)
Potassium: 3.8 mmol/L (ref 3.5–5.1)
Sodium: 135 mmol/L (ref 135–145)
Total Bilirubin: 0.4 mg/dL (ref 0.3–1.2)
Total Protein: 6.3 g/dL — ABNORMAL LOW (ref 6.5–8.1)

## 2022-06-15 LAB — CBC
HCT: 32.1 % — ABNORMAL LOW (ref 36.0–46.0)
HCT: 32 % — ABNORMAL LOW (ref 36.0–46.0)
Hemoglobin: 10.1 g/dL — ABNORMAL LOW (ref 12.0–15.0)
Hemoglobin: 10.1 g/dL — ABNORMAL LOW (ref 12.0–15.0)
MCH: 26.2 pg (ref 26.0–34.0)
MCH: 26.1 pg (ref 26.0–34.0)
MCHC: 31.5 g/dL (ref 30.0–36.0)
MCHC: 31.6 g/dL (ref 30.0–36.0)
MCV: 83.2 fL (ref 80.0–100.0)
MCV: 82.7 fL (ref 80.0–100.0)
Platelets: 452 10*3/uL — ABNORMAL HIGH (ref 150–400)
Platelets: 383 10*3/uL (ref 150–400)
RBC: 3.86 MIL/uL — ABNORMAL LOW (ref 3.87–5.11)
RBC: 3.87 MIL/uL (ref 3.87–5.11)
RDW: 16.6 % — ABNORMAL HIGH (ref 11.5–15.5)
RDW: 16.6 % — ABNORMAL HIGH (ref 11.5–15.5)
WBC: 9.4 10*3/uL (ref 4.0–10.5)
WBC: 7.8 10*3/uL (ref 4.0–10.5)
nRBC: 0 % (ref 0.0–0.2)
nRBC: 0 % (ref 0.0–0.2)

## 2022-06-15 LAB — RPR
RPR Ser Ql: NONREACTIVE
RPR Ser Ql: NONREACTIVE

## 2022-06-15 LAB — GLUCOSE, CAPILLARY
Glucose-Capillary: 111 mg/dL — ABNORMAL HIGH (ref 70–99)
Glucose-Capillary: 112 mg/dL — ABNORMAL HIGH (ref 70–99)
Glucose-Capillary: 120 mg/dL — ABNORMAL HIGH (ref 70–99)
Glucose-Capillary: 88 mg/dL (ref 70–99)

## 2022-06-15 MED ORDER — MEASLES, MUMPS & RUBELLA VAC IJ SOLR: 0.5000 mL | Freq: Once | INTRAMUSCULAR | Status: DC

## 2022-06-15 MED ORDER — ZOLPIDEM TARTRATE 5 MG PO TABS: 5.0000 mg | ORAL_TABLET | Freq: Every evening | ORAL | Status: DC | PRN

## 2022-06-15 MED ORDER — DIPHENHYDRAMINE HCL 50 MG/ML IJ SOLN
12.5000 mg | INTRAMUSCULAR | Status: DC | PRN
  Administered 2022-06-15: 12.5 mg via INTRAVENOUS
  Filled 2022-06-15: qty 1

## 2022-06-15 MED ORDER — EPHEDRINE 5 MG/ML INJ: 10.0000 mg | INTRAVENOUS | Status: DC | PRN

## 2022-06-15 MED ORDER — LIDOCAINE HCL (PF) 1 % IJ SOLN
INTRAMUSCULAR | Status: DC | PRN
  Administered 2022-06-15: 5 mL via EPIDURAL

## 2022-06-15 MED ORDER — SODIUM CHLORIDE 0.9% FLUSH
3.0000 mL | Freq: Two times a day (BID) | INTRAVENOUS | Status: DC
  Administered 2022-06-15: 3 mL via INTRAVENOUS

## 2022-06-15 MED ORDER — PHENYLEPHRINE 80 MCG/ML (10ML) SYRINGE FOR IV PUSH (FOR BLOOD PRESSURE SUPPORT): 80.0000 ug | PREFILLED_SYRINGE | INTRAVENOUS | Status: DC | PRN

## 2022-06-15 MED ORDER — FENTANYL-BUPIVACAINE-NACL 0.5-0.125-0.9 MG/250ML-% EP SOLN
12.0000 mL/h | EPIDURAL | Status: DC | PRN
  Filled 2022-06-15: qty 250

## 2022-06-15 MED ORDER — FENTANYL CITRATE (PF) 100 MCG/2ML IJ SOLN
100.0000 ug | Freq: Once | INTRAMUSCULAR | Status: AC
  Administered 2022-06-15: 100 ug via EPIDURAL

## 2022-06-15 MED ORDER — SIMETHICONE 80 MG PO CHEW: 80.0000 mg | CHEWABLE_TABLET | ORAL | Status: DC | PRN

## 2022-06-15 MED ORDER — SODIUM CHLORIDE 0.9 % IV SOLN: INTRAVENOUS | Status: DC | PRN

## 2022-06-15 MED ORDER — FUROSEMIDE 20 MG PO TABS
20.0000 mg | ORAL_TABLET | Freq: Every day | ORAL | Status: DC
Start: 2022-06-16 — End: 2022-06-17
  Administered 2022-06-16 – 2022-06-17 (×2): 20 mg via ORAL
  Filled 2022-06-15 (×2): qty 1

## 2022-06-15 MED ORDER — SENNOSIDES-DOCUSATE SODIUM 8.6-50 MG PO TABS
2.0000 | ORAL_TABLET | ORAL | Status: DC
Start: 2022-06-15 — End: 2022-06-17
  Administered 2022-06-15 – 2022-06-16 (×2): 2 via ORAL
  Filled 2022-06-15 (×2): qty 2

## 2022-06-15 MED ORDER — ACETAMINOPHEN 325 MG PO TABS
650.0000 mg | ORAL_TABLET | ORAL | Status: DC | PRN
  Administered 2022-06-16: 650 mg via ORAL
  Filled 2022-06-15: qty 2

## 2022-06-15 MED ORDER — LACTATED RINGERS IV SOLN: 500.0000 mL | Freq: Once | INTRAVENOUS | Status: DC

## 2022-06-15 MED ORDER — DIPHENHYDRAMINE HCL 25 MG PO CAPS
25.0000 mg | ORAL_CAPSULE | Freq: Four times a day (QID) | ORAL | Status: DC | PRN
  Administered 2022-06-15: 25 mg via ORAL
  Filled 2022-06-15: qty 1

## 2022-06-15 MED ORDER — ONDANSETRON HCL 4 MG PO TABS: 4.0000 mg | ORAL_TABLET | ORAL | Status: DC | PRN

## 2022-06-15 MED ORDER — TETANUS-DIPHTH-ACELL PERTUSSIS 5-2.5-18.5 LF-MCG/0.5 IM SUSY: 0.5000 mL | PREFILLED_SYRINGE | Freq: Once | INTRAMUSCULAR | Status: DC

## 2022-06-15 MED ORDER — FENTANYL-BUPIVACAINE-NACL 0.5-0.125-0.9 MG/250ML-% EP SOLN
EPIDURAL | Status: DC | PRN
  Administered 2022-06-15: 12 mL/h via EPIDURAL

## 2022-06-15 MED ORDER — COCONUT OIL OIL: 1.0000 | TOPICAL_OIL | Status: DC | PRN

## 2022-06-15 MED ORDER — LACTATED RINGERS AMNIOINFUSION: INTRAVENOUS | Status: DC

## 2022-06-15 MED ORDER — FENTANYL CITRATE (PF) 100 MCG/2ML IJ SOLN
INTRAMUSCULAR | Status: AC
  Filled 2022-06-15: qty 2

## 2022-06-15 MED ORDER — PRENATAL MULTIVITAMIN CH
1.0000 | ORAL_TABLET | Freq: Every day | ORAL | Status: DC
  Administered 2022-06-16 – 2022-06-17 (×2): 1 via ORAL
  Filled 2022-06-15 (×2): qty 1

## 2022-06-15 MED ORDER — BENZOCAINE-MENTHOL 20-0.5 % EX AERO: 1.0000 | INHALATION_SPRAY | CUTANEOUS | Status: DC | PRN

## 2022-06-15 MED ORDER — SODIUM CHLORIDE 0.9% FLUSH: 3.0000 mL | INTRAVENOUS | Status: DC | PRN

## 2022-06-15 MED ORDER — IBUPROFEN 600 MG PO TABS
600.0000 mg | ORAL_TABLET | Freq: Four times a day (QID) | ORAL | Status: DC
Start: 2022-06-15 — End: 2022-06-17
  Administered 2022-06-15 – 2022-06-17 (×7): 600 mg via ORAL
  Filled 2022-06-15 (×7): qty 1

## 2022-06-15 MED ORDER — BUPIVACAINE HCL (PF) 0.25 % IJ SOLN
INTRAMUSCULAR | Status: DC | PRN
  Administered 2022-06-15: 8 mL via EPIDURAL

## 2022-06-15 MED ORDER — ONDANSETRON HCL 4 MG/2ML IJ SOLN: 4.0000 mg | INTRAMUSCULAR | Status: DC | PRN

## 2022-06-15 MED ORDER — FENTANYL CITRATE (PF) 100 MCG/2ML IJ SOLN
INTRAMUSCULAR | Status: DC | PRN
Start: 2022-06-15 — End: 2022-06-15
  Administered 2022-06-15: 100 ug via EPIDURAL

## 2022-06-15 MED ORDER — DIBUCAINE (PERIANAL) 1 % EX OINT: 1.0000 | TOPICAL_OINTMENT | CUTANEOUS | Status: DC | PRN

## 2022-06-15 MED ORDER — WITCH HAZEL-GLYCERIN EX PADS: 1.0000 | MEDICATED_PAD | CUTANEOUS | Status: DC | PRN

## 2022-06-15 NOTE — Anesthesia Preprocedure Evaluation (Signed)
Anesthesia Evaluation  Patient identified by MRN, date of birth, ID band Patient awake    Reviewed: Allergy & Precautions, NPO status , Patient's Chart, lab work & pertinent test results  Airway Mallampati: III  TM Distance: >3 FB Neck ROM: Full    Dental no notable dental hx. (+) Teeth Intact, Dental Advisory Given   Pulmonary former smoker,    Pulmonary exam normal breath sounds clear to auscultation       Cardiovascular negative cardio ROS Normal cardiovascular exam Rhythm:Regular Rate:Normal     Neuro/Psych negative neurological ROS  negative psych ROS   GI/Hepatic negative GI ROS, Neg liver ROS,   Endo/Other  diabetes, Gestational  Renal/GU      Musculoskeletal   Abdominal (+) + obese (BMI 42.3),   Peds  Hematology Lab Results      Component                Value               Date                          HGB                      10.1 (L)            06/15/2022                HCT                      32.1 (L)            06/15/2022               PLT                      452 (H)             06/15/2022              Anesthesia Other Findings   Reproductive/Obstetrics (+) Pregnancy                             Anesthesia Physical Anesthesia Plan  ASA: 3  Anesthesia Plan: Epidural   Post-op Pain Management:    Induction:   PONV Risk Score and Plan:   Airway Management Planned:   Additional Equipment:   Intra-op Plan:   Post-operative Plan:   Informed Consent: I have reviewed the patients History and Physical, chart, labs and discussed the procedure including the risks, benefits and alternatives for the proposed anesthesia with the patient or authorized representative who has indicated his/her understanding and acceptance.       Plan Discussed with:   Anesthesia Plan Comments: (37.6G4P1 w gHtn gDm BMI 42.3 for LEA)        Anesthesia Quick Evaluation

## 2022-06-15 NOTE — Progress Notes (Signed)
CBG obtained at 1200: 88

## 2022-06-15 NOTE — Progress Notes (Signed)
Labor Progress Note Candice Kane is a 36 y.o. Y4M2500 at 59w6dwho presented for IOL due to gHTN.  S: Resting, no concerns. Support person at bedside.   O:  BP 114/64   Pulse 76   Temp 98.1 F (36.7 C) (Oral)   Resp 18   Ht '6\' 1"'$  (1.854 m)   Wt (!) 145.6 kg   LMP 09/23/2021   SpO2 99%   BMI 42.35 kg/m   EFM: Baseline 135 bpm, moderate variability, + accels, no decels  Toco: Every 2 - 3.5 minutes   CVE: Dilation: 2 Effacement (%): 70 Cervical Position: Posterior Station: -3 Presentation: Vertex Exam by:: AFabiola Backer(MD)  A&P: 36y.o. [id number] #Labor: Progressing well. Will start Pitocin 2x2 and reassess in 4 hours. Plan for AROM on next exam as able.  #Pain: PRN; coping well  #FWB: Cat 1  #GBS negative  #gHTN: BP within normal limits. No symptoms. Will continue to monitor.   #A2GDM: CBGs within normal rage. Will continue to monitor every 4 hours in latent phase.   ChGenia DelMD 3:33 AM

## 2022-06-15 NOTE — Lactation Note (Signed)
This note was copied from a baby's chart. Lactation Consultation Note  Patient Name: Candice Kane [id number] Date: 06/15/2022 Reason for consult: Initial assessment Age:36 hours P1, ETI female infant. Per mom, infant has been latching well at the breast and recently BF for 30 minutes, 15 minutes on each breast at 2000 pm. Infant appeared content in calm alert state in basinet. LC did not observe latch at this time. Mom will continue to breast feed infant according to hunger cues, on demand, 8 to 12+ times within 24 hours, skin to skin. Mom knows to call RN/LC if she has any BF questions, concerns or if she needs latch assistance. Mom made aware of O/P services, breastfeeding support groups, community resources, and our phone # for post-discharge questions.   Maternal Data Has patient been taught Hand Expression?: Yes Does the patient have breastfeeding experience prior to this delivery?: No  Feeding Mother's Current Feeding Choice: Breast Milk  LATCH Score                    Lactation Tools Discussed/Used    Interventions Interventions: Breast feeding basics reviewed;Skin to skin;Breast compression;Education;LC Services brochure  Discharge Pump: Personal (Per mom, she has Dr.Browns DEBP at home)  Consult Status Consult Status: Follow-up Date: 06/16/22 Follow-up type: In-patient    Vicente Serene 06/15/2022, 9:02 PM

## 2022-06-15 NOTE — Discharge Summary (Signed)
Postpartum Discharge Summary  Date of Service updated 06/17/22     Patient Name: Candice Kane DOB: Feb 16, 1986 MRN: [id number]  Date of admission: 06/14/2022 Delivery date:06/15/2022  Delivering provider: Patriciaann Clan  Date of discharge: 06/17/2022  Admitting diagnosis: Gestational diabetes mellitus (GDM) controlled on oral hypoglycemic drug [O24.415] Intrauterine pregnancy: [redacted]w[redacted]d    Secondary diagnosis:  Principal Problem:   Gestational diabetes mellitus (GDM) controlled on oral hypoglycemic drug Active Problems:   Advanced maternal age in multigravida   BMI 40.0-44.9, adult ([place]   Gestational hypertension   Alpha thalassemia silent carrier  Additional problems: N/A     Discharge diagnosis: Term Pregnancy Delivered, Gestational Hypertension, and GDM A2                                              Post partum procedures: N/A Augmentation: AROM, Pitocin, and Cytotec Complications: None  Hospital course: Induction of Labor With Vaginal Delivery   36y.o. yo [id number] 36w6das admitted to the hospital 06/14/2022 for induction of labor.  Indication for induction: Gestational hypertension.  Patient had an uncomplicated labor course as follows: Membrane Rupture Time/Date: 12:10 PM ,06/15/2022   Delivery Method:Vaginal, Spontaneous  Episiotomy: None  Lacerations:    Details of delivery can be found in separate delivery note.  Patient had a routine postpartum course. Patient is discharged home 06/17/22.  Newborn Data: Birth date:06/15/2022  Birth time:3:34 PM  Gender:Female  Living status:Living  Apgars:9 ,9  Weight:3125 g   Magnesium Sulfate received: No BMZ received: No Rhophylac:No MMR:No T-DaP:Given prenatally Flu: No Transfusion:No  Physical exam  Vitals:   06/16/22 1557 06/16/22 2043 06/16/22 2345 06/17/22 0500  BP: 127/88 131/73 128/70 130/79  Pulse: 80 90 87 91  Resp: _0 Temp: 98.5 F (36.9 C) 98.1 F (36.7 C) 98.4 F (36.9 C) 98.2 F (36.8  C)  TempSrc: Oral Oral Oral Oral  SpO2:      Weight:      Height:       General: alert, cooperative, and no distress Lochia: appropriate Uterine Fundus: firm Incision: N/A DVT Evaluation: No evidence of DVT seen on physical exam. No significant calf/ankle edema. Labs: Lab Results  Component Value Date   WBC 7.8 06/15/2022   HGB 10.1 (L) 06/15/2022   HCT 32.0 (L) 06/15/2022   MCV 82.7 06/15/2022   PLT 383 06/15/2022      Latest Ref Rng & Units 06/15/2022    4:59 AM  CMP  Glucose 70 - 99 mg/dL 101   BUN 6 - 20 mg/dL 6   Creatinine 0.44 - 1.00 mg/dL 0.62   Sodium 135 - 145 mmol/L 135   Potassium 3.5 - 5.1 mmol/L 3.8   Chloride 98 - 111 mmol/L 101   CO2 22 - 32 mmol/L 20   Calcium 8.9 - 10.3 mg/dL 9.0   Total Protein 6.5 - 8.1 g/dL 6.3   Total Bilirubin 0.3 - 1.2 mg/dL 0.4   Alkaline Phos 38 - 126 U/L 135   AST 15 - 41 U/L 16   ALT 0 - 44 U/L 10    Edinburgh Score:     No data to display           After visit meds:  Allergies as of 06/17/2022   No Known Allergies  Medication List     TAKE these medications    Accu-Chek Guide test strip Generic drug: glucose blood Use to check blood sugars four times a day was instructed   Accu-Chek Softclix Lancets lancets Check capillary blood sugars 4x's daily   acetaminophen 500 MG tablet Commonly known as: TYLENOL Take 2 tablets (1,000 mg total) by mouth every 8 (eight) hours as needed for moderate pain.   aspirin 81 MG chewable tablet Chew 1 tablet (81 mg total) by mouth daily.   Blood Pressure Kit Devi 1 kit by Does not apply route once a week.   Gojji Weight Scale Misc 1 Device by Does not apply route every 30 (thirty) days.   ibuprofen 600 MG tablet Commonly known as: ADVIL Take 1 tablet (600 mg total) by mouth every 6 (six) hours.   Iron Polysacch Cmplx-B12-FA 150-0.025-1 MG Caps Take 1 capsule by mouth every other day.   metFORMIN 500 MG tablet Commonly known as: GLUCOPHAGE Take 1 tablet  (500 mg total) by mouth at bedtime.   prenatal vitamin w/FE, FA 27-1 MG Tabs tablet Take 1 tablet by mouth daily at 12 noon.         Discharge home in stable condition Infant Feeding: Breast supplementing with Donor milk Infant Disposition:home with mother Discharge instruction: per After Visit Summary and Postpartum booklet. Activity: Advance as tolerated. Pelvic rest for 6 weeks.  Diet: routine diet Future Appointments: Future Appointments  Date Time Provider Department Center  06/26/2022 10:00 AM [facility] NURSE [facility] None  06/26/2022 10:45 AM Figueroa, Andrea, LCSW [facility] None  07/30/2022  8:15 AM [facility] LAB [facility] None  07/30/2022  8:55 AM Ervin, Michael L, MD [facility] None   Follow up Visit:  Message sent to [facility] by Dr Beard:  Please schedule this patient for a In person postpartum visit in 6 weeks with the following provider: Any provider. Additional Postpartum F/U:Postpartum Depression checkup, 2 hour GTT, and BP check 1 week  High risk pregnancy complicated by: GDM and HTN, patient's father passed away a few days prior to delivery  Delivery mode:  Vaginal, Spontaneous  Anticipated Birth Control: POPs    06/17/2022 Shay M Payne, CNM    

## 2022-06-15 NOTE — Progress Notes (Signed)
Labor Progress Note Candice Kane is a 36 y.o. [id number] at 70w6dpresented for IOL due to gHTN.   S: Doing well.   O:  BP 114/66   Pulse 75   Temp 98.1 F (36.7 C) (Axillary)   Resp 14   Ht '6\' 1"'$  (1.854 m)   Wt (!) 145.6 kg   LMP 09/23/2021   SpO2 99%   BMI 42.35 kg/m  EFM: 130/mod/15x15/none  CVE: Dilation: 3 Effacement (%): 50 Cervical Position: Posterior Station: -2 Presentation: Vertex Exam by:: S Nix RN   A&P: 36y.o. [id number]#Labor: Progressing well with head well applied to cervix. Currently on pit of 20. After verbal consent, performed AROM with scant amount of clear fluid. Tolerated well.  #Pain: Epidural  #FWB: Cat I  #GBS negative  #gHTN: BP has been WNL to mild range. No symptoms. Cont to monitor.   #GDM: CBGs appropriate.   SaPatriciaann ClanDO 12:13 PM

## 2022-06-15 NOTE — Progress Notes (Signed)
Labor Progress Note  Called by RN due to recurrent moderate variables with several positions and now FHR in the 80's. Went to bedside. Discussed IUPC/FSE placement, which patient was in agreement with. Pit already stopped prior to arrival.   Cervix now about 4.5/90. Placed IUPC + FSE. Continued moderate variables-- started amnioinfusion. FHT moderate variability/reassuring in between with Cat I strip prior to variables starting.   Cont to monitor closely.   Patriciaann Clan, DO

## 2022-06-15 NOTE — Anesthesia Procedure Notes (Signed)
Epidural Patient location during procedure: OB Start time: 06/15/2022 7:33 AM End time: 06/15/2022 7:48 AM  Staffing Anesthesiologist: Barnet Glasgow, MD Performed: anesthesiologist   Preanesthetic Checklist Completed: patient identified, IV checked, site marked, risks and benefits discussed, surgical consent, monitors and equipment checked, pre-op evaluation and timeout performed  Epidural Patient position: sitting Prep: DuraPrep and site prepped and draped Patient monitoring: continuous pulse ox and blood pressure Approach: midline Location: L3-L4 Injection technique: LOR air  Needle:  Needle type: Tuohy  Needle gauge: 17 G Needle length: 9 cm and 9 Needle insertion depth: 9 cm Catheter type: closed end flexible Catheter size: 19 Gauge Catheter at skin depth: 15 cm Test dose: negative  Assessment Events: blood not aspirated, injection not painful, no injection resistance, no paresthesia and negative IV test  Additional Notes Patient identified. Risks/Benefits/Options discussed with patient including but not limited to bleeding, infection, nerve damage, paralysis, failed block, incomplete pain control, headache, blood pressure changes, nausea, vomiting, reactions to medication both or allergic, itching and postpartum back pain. Confirmed with bedside nurse the patient's most recent platelet count. Confirmed with patient that they are not currently taking any anticoagulation, have any bleeding history or any family history of bleeding disorders. Patient expressed understanding and wished to proceed. All questions were answered. Sterile technique was used throughout the entire procedure. Please see nursing notes for vital signs. Test dose was given through epidural needle and negative prior to continuing to dose epidural or start infusion. Warning signs of high block given to the patient including shortness of breath, tingling/numbness in hands, complete motor block, or any  concerning symptoms with instructions to call for help. Patient was given instructions on fall risk and not to get out of bed. All questions and concerns addressed with instructions to call with any issues.  1 Attempt (S) . Patient tolerated procedure well.

## 2022-06-16 ENCOUNTER — Ambulatory Visit: Payer: Medicaid Other

## 2022-06-16 ENCOUNTER — Other Ambulatory Visit: Payer: Medicaid Other

## 2022-06-16 LAB — GLUCOSE, CAPILLARY: Glucose-Capillary: 109 mg/dL — ABNORMAL HIGH (ref 70–99)

## 2022-06-16 MED ORDER — SUCROSE 24% NICU/PEDS ORAL SOLUTION: 0.5000 mL | OROMUCOSAL | Status: DC | PRN

## 2022-06-16 MED ORDER — WHITE PETROLATUM EX OINT: 1.0000 | TOPICAL_OINTMENT | CUTANEOUS | Status: DC | PRN

## 2022-06-16 MED ORDER — NIFEDIPINE ER OSMOTIC RELEASE 30 MG PO TB24
30.0000 mg | ORAL_TABLET | Freq: Every day | ORAL | Status: DC
  Administered 2022-06-16 – 2022-06-17 (×2): 30 mg via ORAL
  Filled 2022-06-16 (×2): qty 1

## 2022-06-16 MED ORDER — LIDOCAINE 1% INJECTION FOR CIRCUMCISION: 0.8000 mL | INJECTION | Freq: Once | INTRAVENOUS | Status: DC

## 2022-06-16 MED ORDER — EPINEPHRINE TOPICAL FOR CIRCUMCISION 0.1 MG/ML: 1.0000 [drp] | TOPICAL | Status: DC | PRN

## 2022-06-16 NOTE — Lactation Note (Addendum)
This note was copied from a baby's chart. Lactation Consultation Note  Patient Name: Candice Kane [id number] Date: 06/16/2022 Reason for consult: Follow-up assessment;Early term 37-38.6wks Age:36 hours  P2, Mother on phone when [facility] entered room making funeral arrangements for her father.  Suggest she call for help with breastfeeding as needed.   LC returned to room to observe feeding.  Baby has had one stool since birth. Reviewed hand expression with no drops expressed. Mother states she only breastfeed briefly with her first child due to low milk supply. Observed mother latch baby with ease and baby sustained latch.  Encouraged mother to offer both breasts per session. Suggest calling for help as needed.  Feeding Mother's Current Feeding Choice: Breast Milk     Consult Status Consult Status: Follow-up Date: 06/17/22 Follow-up type: In-patient    Vivianne Master [facility] 06/16/2022, 11:29 AM

## 2022-06-16 NOTE — Progress Notes (Signed)
Attending Circumcision Counseling Progress Note  Patient desires circumcision for her female infant.  Circumcision procedure details discussed, risks and benefits of procedure were also discussed.  These include but are not limited to: Benefits of circumcision in men include reduction in the rates of urinary tract infection (UTI), penile cancer, some sexually transmitted infections, penile inflammatory and retractile disorders, as well as easier hygiene.  Risks include bleeding , infection, injury of glans which may lead to penile deformity or urinary tract issues, unsatisfactory cosmetic appearance and other potential complications related to the procedure.  It was emphasized that this is an elective procedure.  Patient wants to proceed with circumcision; written informed consent obtained.  Will do circumcision soon, routine circumcision and post circumcision care ordered for the infant.  Safir Michalec L. Rip Harbour, M.D. 06/16/2022 9:29 AM

## 2022-06-16 NOTE — Progress Notes (Signed)
POSTPARTUM PROGRESS NOTE  Subjective: Candice Kane is a 36 y.o. [id number] PPD#1 s/p  SVD at [redacted]w[redacted]d  She reports she doing well. No acute events overnight. She denies any problems with ambulating, voiding or po intake. Denies nausea or vomiting. She has  passed flatus. Pain is well controlled.  Lochia is scant.  Objective: Blood pressure (!) 134/92, pulse 84, temperature 98 F (36.7 C), temperature source Oral, resp. rate 16, height '6\' 1"'$  (1.854 m), weight (!) 145.6 kg, last menstrual period 09/23/2021, SpO2 99 %, unknown if currently breastfeeding.  Physical Exam:  General: alert, cooperative and no distress Chest: no respiratory distress Abdomen: soft, non-tender  Uterine Fundus: firm, appropriately tender Extremities: No calf swelling or tenderness  no edema  Recent Labs    06/15/22 0459 06/15/22 1631  HGB 10.1* 10.1*  HCT 32.1* 32.0*    Assessment/Plan: 36y.o. [id number]#1 s/p  SVD at 312w6d  Routine Postpartum Care: Doing well, pain well-controlled. Having some cramping - ibuprofen and Tylenol helps -- Continue routine care, lactation support  -- Contraception: unsure -- Feeding: breast  #gHTN BP in 130s-140s/80s-90s. On lasix for 5 days. Meets criteria to start Procardia.  - Start procardia '30mg'$  daily    Dispo: Plan for discharge on PPD#2 as infant has not voided yet so being watched.   AnRenard MatterMD, MPH OB Fellow, [facility]

## 2022-06-16 NOTE — Lactation Note (Signed)
This note was copied from a baby's chart. Lactation Consultation Note  Patient Name: Candice Kane [id number] Date: 06/16/2022 Reason for consult: Follow-up assessment;Mother's request;Difficult latch;Early term 37-38.6wks;Maternal endocrine disorder (PIH ( Lasix, Procardia)) Age:36 hours  LC attempted latch, infant not able to sustain it. LC did breast compression in different positions, infant did not sustain latch and fell asleep at the breast.  With hand expression, few drops of colostrum noted. Mom set up on DEBP and try to offer more volume.   Plan 1. To feed based on cues 8-12x 24hr period.  Mom to offer breasts and look for signs of milk transfer.  2. Mom to supplement with EBM, if needed DBM 7-12 ml per feeding after latching with pace bottle feeding and slow flow nipple.  3. Post pump after each feeding for 30mns.     Maternal Data Has patient been taught Hand Expression?: Yes  Feeding Mother's Current Feeding Choice: Breast Milk  LATCH Score Latch: Repeated attempts needed to sustain latch, nipple held in mouth throughout feeding, stimulation needed to elicit sucking reflex.  Audible Swallowing: A few with stimulation  Type of Nipple: Everted at rest and after stimulation  Comfort (Breast/Nipple): Soft / non-tender  Hold (Positioning): Assistance needed to correctly position infant at breast and maintain latch.  LATCH Score: 7   Lactation Tools Discussed/Used Tools: Pump;Flanges Flange Size: 24 Breast pump type: Double-Electric Breast Pump Pump Education: Setup, frequency, and cleaning;Milk Storage Reason for Pumping: increase stimulation Pumping frequency: post pump after each feeding for 15 mins  Interventions Interventions: Breast feeding basics reviewed;Assisted with latch;Skin to skin;Breast massage;Hand express;Breast compression;Adjust position;Support pillows;Position options;Expressed milk;DEBP;Education;Pace feeding;LC SMagazine features editorInfant Driven  Feeding Algorithm education  Discharge Pump: DEBP;Personal WIC Program: No  Consult Status Consult Status: Follow-up Date: 06/17/22 Follow-up type: In-patient    Candice Kane  Candice Kane 06/16/2022, 6:15 PM

## 2022-06-16 NOTE — Anesthesia Postprocedure Evaluation (Signed)
Anesthesia Post Note  Patient: Shayann Garbutt  Procedure(s) Performed: AN AD [place]     Patient location during evaluation: Mother Baby Anesthesia Type: Epidural Level of consciousness: awake, awake and alert and oriented Pain management: pain level controlled Vital Signs Assessment: post-procedure vital signs reviewed and stable Respiratory status: spontaneous breathing, nonlabored ventilation and respiratory function stable Cardiovascular status: stable Postop Assessment: no headache, patient able to bend at knees, no apparent nausea or vomiting, adequate PO intake and able to ambulate Anesthetic complications: no   No notable events documented.  Last Vitals:  Vitals:   06/16/22 0315 06/16/22 0524  BP: 118/74 113/80  Pulse: 78 80  Resp: 17 18  Temp: 36.8 C 36.9 C  SpO2: 100% 99%    Last Pain:  Vitals:   06/16/22 0524  TempSrc: Oral  PainSc:    Pain Goal: Patients Stated Pain Goal: 1 (06/15/22 0445)                 Marcy Siren

## 2022-06-17 ENCOUNTER — Other Ambulatory Visit (HOSPITAL_COMMUNITY): Payer: Self-pay

## 2022-06-17 MED ORDER — FUROSEMIDE 20 MG PO TABS
20.0000 mg | ORAL_TABLET | Freq: Every day | ORAL | 0 refills | Status: DC
  Filled 2022-06-17: qty 4, 4d supply, fill #0

## 2022-06-17 MED ORDER — NIFEDIPINE ER 30 MG PO TB24
30.0000 mg | ORAL_TABLET | Freq: Every day | ORAL | 11 refills | Status: DC
  Filled 2022-06-17: qty 30, 30d supply, fill #0

## 2022-06-17 MED ORDER — IBUPROFEN 600 MG PO TABS
600.0000 mg | ORAL_TABLET | Freq: Four times a day (QID) | ORAL | 0 refills | Status: DC
  Filled 2022-06-17: qty 30, 8d supply, fill #0

## 2022-06-17 NOTE — Lactation Note (Signed)
This note was copied from a baby's chart. Lactation Consultation Note  Patient Name: Candice Kane [id number] Date: 06/17/2022 Reason for consult: Follow-up assessment;Maternal endocrine disorder Age:36 hours  Baby is out of room for circumcision.  Mother has started supplementing with donor milk.  Encouraged mother to pump here and at home after feedings to increase her supply.  Reviewed engorgement care and monitoring voids/stools.  Feeding Mother's Current Feeding Choice: Breast Milk and Donor Milk   Interventions Interventions: DEBP;Education  Discharge Discharge Education: Engorgement and breast care;Warning signs for feeding baby Pump: DEBP;Personal (Dr Roosvelt Harps and Hildred Alamin is coming)  Consult Status Consult Status: Complete Date: 06/17/22    Vivianne Master [facility] 06/17/2022, 10:28 AM

## 2022-06-17 NOTE — Progress Notes (Signed)
POSTPARTUM PROGRESS NOTE  Subjective: Candice Kane is a 36 y.o. [id number] s/p SVD at [redacted]w[redacted]d  She reports she doing well. No acute events overnight. She denies any problems with ambulating, voiding or po intake. Denies nausea or vomiting. She has  passed flatus. Pain is well controlled.  Lochia is minimal.  Objective: Blood pressure 130/79, pulse 91, temperature 98.2 F (36.8 C), temperature source Oral, resp. rate 18, height '6\' 1"'$  (1.854 m), weight (!) 145.6 kg, last menstrual period 09/23/2021, SpO2 99 %, unknown if currently breastfeeding.  Physical Exam:  General: alert, cooperative and no distress Chest: no respiratory distress Abdomen: soft, non-tender  Uterine Fundus: firm and at level of umbilicus Extremities: No calf swelling or tenderness  bilateral ankle edema  Recent Labs    06/15/22 0459 06/15/22 1631  HGB 10.1* 10.1*  HCT 32.1* 32.0*    Assessment/Plan: AChaquetta Schlottmanis a 36y.o. [id number]/p SVD at 361w6dGHTN: no elevated pressures, continue lasix and procardia postpartum  Routine Postpartum Care: Doing well, pain well-controlled.  -- Continue routine care, lactation support  -- Contraception: POPs -- Feeding: Breast -- Circumcision: patient desires circumcision (consented) - baby has now voided several times.  Dispo: Plan for discharge today.  JaAnnia BeltMe[place]or [facility] 06/17/2022 7:35 AM

## 2022-06-19 ENCOUNTER — Encounter: Payer: Medicaid Other | Admitting: Obstetrics and Gynecology

## 2022-06-23 ENCOUNTER — Ambulatory Visit: Payer: Medicaid Other

## 2022-06-23 ENCOUNTER — Other Ambulatory Visit: Payer: Medicaid Other

## 2022-06-23 ENCOUNTER — Inpatient Hospital Stay (HOSPITAL_COMMUNITY)
Admission: AD | Admit: 2022-06-23 | Payer: Medicaid Other | Source: Home / Self Care | Admitting: Obstetrics and Gynecology

## 2022-06-23 ENCOUNTER — Inpatient Hospital Stay (HOSPITAL_COMMUNITY): Payer: Medicaid Other

## 2022-06-26 ENCOUNTER — Ambulatory Visit (INDEPENDENT_AMBULATORY_CARE_PROVIDER_SITE_OTHER): Payer: Medicaid Other | Admitting: Licensed Clinical Social Worker

## 2022-06-26 ENCOUNTER — Institutional Professional Consult (permissible substitution): Payer: Medicaid Other | Admitting: Licensed Clinical Social Worker

## 2022-06-26 ENCOUNTER — Encounter: Payer: Medicaid Other | Admitting: Obstetrics and Gynecology

## 2022-06-26 DIAGNOSIS — Z8759 Personal history of other complications of pregnancy, childbirth and the puerperium: Secondary | ICD-10-CM

## 2022-06-26 DIAGNOSIS — Z8659 Personal history of other mental and behavioral disorders: Secondary | ICD-10-CM

## 2022-06-30 ENCOUNTER — Ambulatory Visit (INDEPENDENT_AMBULATORY_CARE_PROVIDER_SITE_OTHER): Payer: Medicaid Other | Admitting: Emergency Medicine

## 2022-06-30 DIAGNOSIS — O1003 Pre-existing essential hypertension complicating the puerperium: Secondary | ICD-10-CM

## 2022-06-30 NOTE — BH Specialist Note (Signed)
Integrated Behavioral Health Initial In-Person Visit  MRN: [id number] Name: Candice Kane  Number of [place] Clinician visits: 1 Session Start time:   315pm Session End time: 330pm Total time in minutes: 15 mins via phone per patient request   Types of Service: Telephone visit and [place] (BHI)  Interpretor:No. Interpretor Name and Language: none   Warm Hand Off Completed.        Subjective: Candice Kane is a 35 y.o. female accompanied by n/a Patient was referred by hospital discharge for mood check. Patient reports the following symptoms/concerns: mood check  Duration of problem: approx 2 weeks ; Severity of problem: mild  Objective: Mood: Good and Affect: Appropriate Risk of harm to self or others: No plan to harm self or others  Life Context: Family and Social: lives with children in [place]  School/Work: [employer]/Grad school  Self-Care: n/a Life Changes: Newborn at home   Patient and/or Family's Strengths/Protective Factors: Concrete supports in place (healthy food, safe environments, etc.)  Goals Addressed: Patient will: Reduce symptoms of:  mood check  Increase knowledge and/or ability of: stress reduction  Demonstrate ability to: Increase adequate support systems for patient/family  Progress towards Goals: Ongoing  Interventions: Interventions utilized: Supportive Counseling  Standardized Assessments completed: Edinburgh Postnatal Depression  Patient and/or Family Response: Completed two week mood check with Ms. Lilia Argue, LCSW

## 2022-06-30 NOTE — Progress Notes (Signed)
Patient presents for PP BP check. VD on 7/16 BP 128/86 P 80. Denies headache, vision changes or swelling. Endorses positive mood.  Instructed to continue current medication regimen and to contact office if she increased BP readings, headaches, vision changes or swelling. Pt agrees and has no concerns.

## 2022-07-21 ENCOUNTER — Telehealth: Payer: Self-pay

## 2022-07-21 NOTE — Telephone Encounter (Signed)
Attempted to call patient regarding FMLA paperwork. VM was left stating to return my call.

## 2022-07-23 ENCOUNTER — Telehealth: Payer: Self-pay | Admitting: *Deleted

## 2022-07-23 NOTE — Telephone Encounter (Signed)
TC to clarify dates for FMLA. No answer. HIPPA compliant VM left. MyChart message sent.

## 2022-07-30 ENCOUNTER — Encounter: Payer: Self-pay | Admitting: Obstetrics and Gynecology

## 2022-07-30 ENCOUNTER — Ambulatory Visit: Payer: Medicaid Other | Admitting: Licensed Clinical Social Worker

## 2022-07-30 ENCOUNTER — Telehealth (INDEPENDENT_AMBULATORY_CARE_PROVIDER_SITE_OTHER): Payer: Medicaid Other | Admitting: Obstetrics and Gynecology

## 2022-07-30 ENCOUNTER — Other Ambulatory Visit: Payer: Medicaid Other

## 2022-07-30 DIAGNOSIS — O24435 Gestational diabetes mellitus in puerperium, controlled by oral hypoglycemic drugs: Secondary | ICD-10-CM

## 2022-07-30 DIAGNOSIS — O24415 Gestational diabetes mellitus in pregnancy, controlled by oral hypoglycemic drugs: Secondary | ICD-10-CM

## 2022-07-30 DIAGNOSIS — Z30011 Encounter for initial prescription of contraceptive pills: Secondary | ICD-10-CM

## 2022-07-30 DIAGNOSIS — O139 Gestational [pregnancy-induced] hypertension without significant proteinuria, unspecified trimester: Secondary | ICD-10-CM

## 2022-07-30 MED ORDER — NORETHINDRONE 0.35 MG PO TABS: 1.0000 | ORAL_TABLET | Freq: Every day | ORAL | 11 refills | Status: DC

## 2022-07-30 NOTE — Patient Instructions (Signed)

## 2022-07-30 NOTE — Progress Notes (Signed)
TELEHEALTH POSTPARTUM VISIT ENCOUNTER NOTE  Provider location: [facility] [place]   I connected [place] on 07/30/22 at  8:55 AM EDT by telephone at home and verified that I am speaking with the correct person using two identifiers.   I discussed the limitations, risks, security and privacy concerns of performing an evaluation and management service by telephone and the availability of in person appointments. I also discussed with the patient that there may be a patient responsible charge related to this service. The patient expressed understanding and agreed to proceed.  Appointment Date: 07/30/2022   Chief Complaint:  Postpartum Visit  History of Present Illness: Candice Kane is a 36 y.o. African-American [id number] (No LMP recorded.), seen for the above chief complaint. Her past medical history is significant for GDM and GHTN.   She is s/p normal spontaneous vaginal delivery on 06/15/22 at 37 6/7 weeks; she was discharged to home on PPD#2. Pregnancy complicated by GDM and GHTN. Baby is doing well.  Complains of None  Vaginal bleeding or discharge: No  Mode of feeding infant:  Both Intercourse: No  Contraception: oral progesterone-only contraceptive PP depression s/s: No .  Any bowel or bladder issues: No  Pap smear: no abnormalities (date: 1/23)  Review of Systems: Her 12 point review of systems is negative or as noted in the History of Present Illness.  Patient Active Problem List   Diagnosis Date Noted   Postpartum care following vaginal delivery 07/30/2022   Gestational hypertension 06/15/2022   Gestational diabetes mellitus (GDM) controlled on oral hypoglycemic drug 06/14/2022   BMI 40.0-44.9, adult ([place]) 06/05/2022   Varicose veins of lower extremities with other complications 75/64/3329   Goiter 03/10/2013   Lipoma of back 03/10/2013    Medications Laylamarie Meuser had no medications administered during this visit. Current Outpatient Medications   Medication Sig Dispense Refill   norethindrone (MICRONOR) 0.35 MG tablet Take 1 tablet (0.35 mg total) by mouth daily. 28 tablet 11   Accu-Chek Softclix Lancets lancets Check capillary blood sugars 4x's daily 100 each 12   acetaminophen (TYLENOL) 500 MG tablet Take 2 tablets (1,000 mg total) by mouth every 8 (eight) hours as needed for moderate pain. 30 tablet 0   Blood Pressure Monitoring (BLOOD PRESSURE KIT) DEVI 1 kit by Does not apply route once a week. 1 each 0   glucose blood (ACCU-CHEK GUIDE) test strip Use to check blood sugars four times a day was instructed 100 each 12   ibuprofen (ADVIL) 600 MG tablet Take 1 tablet (600 mg total) by mouth every 6 (six) hours. 30 tablet 0   Misc. Devices (GOJJI WEIGHT SCALE) MISC 1 Device by Does not apply route every 30 (thirty) days. 1 each 0   NIFEdipine (ADALAT CC) 30 MG 24 hr tablet Take 1 tablet (30 mg total) by mouth daily. 30 tablet 11   prenatal vitamin w/FE, FA (PRENATAL 1 + 1) 27-1 MG TABS tablet Take 1 tablet by mouth daily at 12 noon.     No current facility-administered medications for this visit.    Allergies Patient has no known allergies.  Physical Exam:  General:  Alert, oriented and cooperative.   Mental Status: Normal mood and affect perceived. Normal judgment and thought content.  Rest of physical exam deferred due to type of encounter  PP Depression Screening:    Edinburgh Postnatal Depression Scale - 07/30/22 0900       Edinburgh Postnatal Depression Scale:  In the Past 7  Days   I have been able to laugh and see the funny side of things. 0    I have looked forward with enjoyment to things. 0    I have blamed myself unnecessarily when things went wrong. 0    I have been anxious or worried for no good reason. 0    I have felt scared or panicky for no good reason. 0    Things have been getting on top of me. 0    I have been so unhappy that I have had difficulty sleeping. 0    I have felt sad or miserable. 0    I have  been so unhappy that I have been crying. 0    The thought of harming myself has occurred to me. 0    Edinburgh Postnatal Depression Scale Total 0             Assessment:Patient is a 36 y.o. [id number] who is 6 weeks postpartum from a normal spontaneous vaginal delivery.  She is doing well.   Plan:  1. Encounter for initial prescription of contraceptive pills  - norethindrone (MICRONOR) 0.35 MG tablet; Take 1 tablet (0.35 mg total) by mouth daily.  Dispense: 28 tablet; Refill: 11  2. Gestational diabetes mellitus (GDM) controlled on oral hypoglycemic drug, antepartum 2 hr GTT scheduled for tomorrow  3. Gestational hypertension, antepartum Is still taking Procardia. Will have BP check tomorrow with 2 hr GTT appt  4. Postpartum care following vaginal delivery      I discussed the assessment and treatment plan with the patient. The patient was provided an opportunity to ask questions and all were answered. The patient agreed with the plan and demonstrated an understanding of the instructions.   The patient was advised to call back or seek an in-person evaluation/go to the ED for any concerning postpartum symptoms.  I provided 10 minutes of non-face-to-face time during this encounter.   Chancy Milroy, MD [facility] [place]

## 2022-07-31 ENCOUNTER — Ambulatory Visit (INDEPENDENT_AMBULATORY_CARE_PROVIDER_SITE_OTHER): Payer: Medicaid Other | Admitting: Emergency Medicine

## 2022-07-31 ENCOUNTER — Other Ambulatory Visit: Payer: Medicaid Other

## 2022-07-31 ENCOUNTER — Ambulatory Visit (INDEPENDENT_AMBULATORY_CARE_PROVIDER_SITE_OTHER): Payer: Medicaid Other | Admitting: Licensed Clinical Social Worker

## 2022-07-31 VITALS — BP 135/86 | Ht 73.0 in | Wt 319.0 lb

## 2022-07-31 DIAGNOSIS — Z0130 Encounter for examination of blood pressure without abnormal findings: Secondary | ICD-10-CM

## 2022-07-31 DIAGNOSIS — Z8659 Personal history of other mental and behavioral disorders: Secondary | ICD-10-CM | POA: Diagnosis not present

## 2022-07-31 DIAGNOSIS — Z8759 Personal history of other complications of pregnancy, childbirth and the puerperium: Secondary | ICD-10-CM | POA: Diagnosis not present

## 2022-07-31 DIAGNOSIS — O24415 Gestational diabetes mellitus in pregnancy, controlled by oral hypoglycemic drugs: Secondary | ICD-10-CM

## 2022-07-31 NOTE — Progress Notes (Signed)
Patient presents for PP BP check. VD on 7/16. BP 135/86, P 102 in office today. Denies headache, vision changes or swelling.  Per Rip Harbour, MD, f/u with PCP in 4-6 weeks for BP check. Do not resume BP medication.

## 2022-08-01 LAB — GLUCOSE TOLERANCE, 2 HOURS
Glucose, 2 hour: 102 mg/dL (ref 70–139)
Glucose, GTT - Fasting: 86 mg/dL (ref 70–99)
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
## 2023-02-10 ENCOUNTER — Ambulatory Visit (INDEPENDENT_AMBULATORY_CARE_PROVIDER_SITE_OTHER): Payer: Medicaid Other | Admitting: Physician Assistant

## 2023-02-10 ENCOUNTER — Encounter: Payer: Self-pay | Admitting: Physician Assistant

## 2023-02-10 VITALS — BP 120/74 | HR 76 | Temp 98.0°F | Ht 73.0 in | Wt 329.0 lb

## 2023-02-10 DIAGNOSIS — D563 Thalassemia minor: Secondary | ICD-10-CM

## 2023-02-10 DIAGNOSIS — R079 Chest pain, unspecified: Secondary | ICD-10-CM

## 2023-02-10 DIAGNOSIS — Z30011 Encounter for initial prescription of contraceptive pills: Secondary | ICD-10-CM

## 2023-02-10 DIAGNOSIS — M5431 Sciatica, right side: Secondary | ICD-10-CM | POA: Diagnosis not present

## 2023-02-10 DIAGNOSIS — I83893 Varicose 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 complications: Secondary | ICD-10-CM | POA: Diagnosis not present

## 2023-02-10 DIAGNOSIS — Z8632 Personal history of gestational diabetes: Secondary | ICD-10-CM | POA: Insufficient documentation

## 2023-02-10 LAB — COMPREHENSIVE METABOLIC PANEL
ALT: 9 U/L (ref 0–35)
AST: 10 U/L (ref 0–37)
Albumin: 3.7 g/dL (ref 3.5–5.2)
Alkaline Phosphatase: 87 U/L (ref 39–117)
BUN: 9 mg/dL (ref 6–23)
CO2: 29 mEq/L (ref 19–32)
Calcium: 8.9 mg/dL (ref 8.4–10.5)
Chloride: 100 mEq/L (ref 96–112)
Creatinine, Ser: 0.7 mg/dL (ref 0.40–1.20)
GFR: 111.19 mL/min (ref >60.00)
Glucose, Bld: 95 mg/dL (ref 70–99)
Potassium: 3.8 mEq/L (ref 3.5–5.1)
Sodium: 137 mEq/L (ref 135–145)
Total Bilirubin: 0.3 mg/dL (ref 0.2–1.2)
Total Protein: 6.8 g/dL (ref 6.0–8.3)

## 2023-02-10 LAB — CBC WITH DIFFERENTIAL/PLATELET
Basophils Absolute: 0 10*3/uL (ref 0.0–0.1)
Basophils Relative: 0.6 % (ref 0.0–3.0)
Eosinophils Absolute: 0.1 10*3/uL (ref 0.0–0.7)
Eosinophils Relative: 1.3 % (ref 0.0–5.0)
HCT: 33 % — ABNORMAL LOW (ref 36.0–46.0)
Hemoglobin: 10.6 g/dL — ABNORMAL LOW (ref 12.0–15.0)
Lymphocytes Relative: 33.7 % (ref 12.0–46.0)
Lymphs Abs: 2.3 10*3/uL (ref 0.7–4.0)
MCHC: 32 g/dL (ref 30.0–36.0)
MCV: 79.8 fl (ref 78.0–100.0)
Monocytes Absolute: 0.4 10*3/uL (ref 0.1–1.0)
Monocytes Relative: 5.5 % (ref 3.0–12.0)
Neutro Abs: 4 10*3/uL (ref 1.4–7.7)
Neutrophils Relative %: 58.9 % (ref 43.0–77.0)
Platelets: 520 10*3/uL — ABNORMAL HIGH (ref 150.0–400.0)
RBC: 4.14 Mil/uL (ref 3.87–5.11)
RDW: 16.6 % — ABNORMAL HIGH (ref 11.5–15.5)
WBC: 6.8 10*3/uL (ref 4.0–10.5)

## 2023-02-10 LAB — TSH: TSH: 0.64 u[IU]/mL (ref 0.35–5.50)

## 2023-02-10 LAB — POCT URINE PREGNANCY: Preg Test, Ur: NEGATIVE

## 2023-02-10 MED ORDER — NORETHINDRONE 0.35 MG PO TABS: 1.0000 | ORAL_TABLET | Freq: Every day | ORAL | 1 refills | Status: DC

## 2023-02-10 MED ORDER — MELOXICAM 15 MG PO TABS: 15.0000 mg | ORAL_TABLET | Freq: Every day | ORAL | 0 refills | Status: DC

## 2023-02-10 NOTE — Progress Notes (Addendum)
Candice Kane is a 37 y.o. female here for a new problem.  History of Present Illness:   Chief Complaint  Patient presents with   Establish Care   sciatica pain    Pt c/o right sciatica pain radiating down right leg, started a year ago off and on.   Contraception    Pt would like to discuss birth control would prefer pills.   Varicose Veins    Pt having bilateral varicose veins back of legs. Pt would like a referral.    HPI  Sciatica of R side Patient reports symptoms x 1 year Intermittent Starts in R low back and radiates down leg Used to walk regularly but after baby has not been able to as much, now that its warmer outside she is trying to be active  Has tried stretching, increased water intake, ibuprofen Denies: loss of bowel/bladder function, weakness  Contraception Currently not on any contraception She would like to go on pills In the past was on pills and did well with this Denies prior hx blood clots Does have hx of gestational HTN She is currently 9 months post-partum  Varicose veins She has varicose veins on b/l legs She would like a referral to a specialist Has tried compression stockings and elevation Can get pain at times  Chest pain  Started feeling 3-4 days ago Can get a sharp shooting pain Lasts for seconds Occurring twice daily Does not occur with activity, just can happen at rest Denies SOB, heart racing, palpitations, fever, chills, pain with exertion  Alpha thalassemia Was found to have this on screening Is not aware of this    Past Medical History:  Diagnosis Date   Alpha thalassemia silent carrier 06/15/2022   Anemia    History of chlamydia 12/01/2009   Sciatica of right side    Vaginal delivery    2012, 2023   Varicose veins      Social History   Tobacco Use   Smoking status: Former    Types: Cigars   Smokeless tobacco: Never  Vaping Use   Vaping Use: Never used  Substance Use Topics   Alcohol use: Yes    Comment:  Occasionally   Drug use: Not Currently    Types: Marijuana    Past Surgical History:  Procedure Laterality Date   ADENOIDECTOMY     BACK SURGERY  08/14/2021   TONSILLECTOMY      Family History  Problem Relation Age of Onset   Cancer Mother        breast   Kidney failure Mother    Stroke Mother    Diabetes Father    Hyperlipidemia Father    Hypertension Father    Colon cancer Maternal Grandmother        colon   COPD Maternal Grandfather     No Known Allergies  Current Medications:   Current Outpatient Medications:    ibuprofen (ADVIL) 600 MG tablet, Take 1 tablet (600 mg total) by mouth every 6 (six) hours., Disp: 30 tablet, Rfl: 0   meloxicam (MOBIC) 15 MG tablet, Take 1 tablet (15 mg total) by mouth daily., Disp: 30 tablet, Rfl: 0   Accu-Chek Softclix Lancets lancets, Check capillary blood sugars 4x's daily (Patient not taking: Reported on 02/10/2023), Disp: 100 each, Rfl: 12   glucose blood (ACCU-CHEK GUIDE) test strip, Use to check blood sugars four times a day was instructed (Patient not taking: Reported on 02/10/2023), Disp: 100 each, Rfl: 12   norethindrone (MICRONOR) 0.35 MG  tablet, Take 1 tablet (0.35 mg total) by mouth daily., Disp: 84 tablet, Rfl: 1   Review of Systems:   ROS Negative unless otherwise specified per HPI.  Vitals:   Vitals:   02/10/23 0957  BP: 120/74  Pulse: 76  Temp: 98 F (36.7 C)  TempSrc: Temporal  Weight: (!) 329 lb (149.2 kg)  Height: '6\' 1"'$  (1.854 m)     Body mass index is 43.41 kg/m.  Physical Exam:   Physical Exam Vitals and nursing note reviewed.  Constitutional:      General: She is not in acute distress.    Appearance: She is well-developed. She is not ill-appearing or toxic-appearing.  Cardiovascular:     Rate and Rhythm: Normal rate and regular rhythm.     Pulses: Normal pulses.     Heart sounds: Normal heart sounds, S1 normal and S2 normal.  Pulmonary:     Effort: Pulmonary effort is normal.     Breath sounds:  Normal breath sounds.  Musculoskeletal:     Comments: No bony tenderness to lower spine Normal ROM  Normal gait  Skin:    General: Skin is warm and dry.     Comments: Torturous veins on posterior aspect of b/l legs  Neurological:     Mental Status: She is alert.     GCS: GCS eye subscore is 4. GCS verbal subscore is 5. GCS motor subscore is 6.  Psychiatric:        Speech: Speech normal.        Behavior: Behavior normal. Behavior is cooperative.     Assessment and Plan:   Sciatica of right side No red flags on exam Recommend mobic 15 mg daily Handout with stretches provided Physical therapy ordered Avoid aggressive activities If lack of improvement, will refer to sports medicine  Varicose 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 complications Continue compression stockings Continue elevation Referral placed  Encounter for contraceptive management, unspecified type Urine preg negative Will send in micronor given recent HTN Follow-up with gynecology or [place] for further in 3-6 months and refills  Chest pain, unspecified type EKG tracing is personally reviewed.  EKG notes NSR.  No acute changes.   Unclear etiology Will refer to cardiology for further evaluation  Alpha thalassemia Provided handout on this   Time spent with patient today was 50 minutes which consisted of chart review, discussing diagnosis, work up, treatment answering questions and documentation.  Inda Coke, PA-C

## 2023-02-10 NOTE — Patient Instructions (Addendum)
It was great to see you!  You have an alpha thalassemia -- see handout regarding this so you can understand more. Let me know if questions.  For your sciatic pain, I will refer to physical therapy. Please start daily meloxicam. Do not take additional ibuprofen with this. May take tylenol if needed. Consider stretches.  I will refer you to vascular for your varicose veins.  I will send in birth control pill for you to start.  Your EKG is overall fine I would like to refer to cardiology for further evaluation given your history of elevated blood pressure to make sure nothing more significant is going on   Take care,  Inda Coke PA-C

## 2023-03-06 NOTE — Therapy (Unsigned)
OUTPATIENT PHYSICAL THERAPY THORACOLUMBAR EVALUATION   Patient Name: Candice Kane MRN: [id number] DOB:01/01/1986, 37 y.o., female Today's Date: 03/10/2023  END OF SESSION:  PT End of Session - 03/09/23 1555     Visit Number 1    Number of Visits 16    Date for PT Re-Evaluation 05/04/23    Authorization Type [place] Medicaid    PT Start Time 1549    PT Stop Time 1632    PT Time Calculation (min) 43 min    Activity Tolerance Patient tolerated treatment well    [facility] for tasks assessed/performed             Past Medical History:  Diagnosis Date   Alpha thalassemia silent carrier 06/15/2022   Anemia    History of chlamydia 12/01/2009   Sciatica of right side    Vaginal delivery    2012, 2023   Varicose veins    Past Surgical History:  Procedure Laterality Date   ADENOIDECTOMY     BACK SURGERY  08/14/2021   TONSILLECTOMY     Patient Active Problem List   Diagnosis Date Noted   History of gestational diabetes 02/10/2023   Varicose 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 complications 03/10/2013   Lipoma of back 03/10/2013    PCP: Jarold MottoWorley, Samantha PA  REFERRING PROVIDER: Jarold MottoWorley, Samantha PA   REFERRING DIAG: M54.31 (ICD-10-CM) - Sciatica of right side  Rationale for Evaluation and Treatment: Rehabilitation  THERAPY DIAG:  Radiculopathy, lumbar region  Pain in left leg  ONSET DATE: 8 mos ago   SUBJECTIVE:                                                                                                                                                                                           SUBJECTIVE STATEMENT: Pt with Rt sided low back and Rt LE pain intermittently for about 1 yr.  The pain begins in her back and radiates to her L leg, settles in hamstring.  The pain can be random, associated with getting out of the car, walking and moving the wrong way. She has shooting pain and pain daily.  She has done some stretching, takes meds.   She used to walk with a friend but unable due to pain. She has difficulty at work, limits walking.    PERTINENT HISTORY:  See above    PAIN:  Are you having pain? Yes: NPRS scale: 3/10 Pain location: Rt buttocks thigh to calf at times  Pain description: achy  Aggravating factors: sitting, pins and needles, numbness, nerve Relieving factors: hot water, meds, MHP in lazy boy.  PRECAUTIONS: None  WEIGHT BEARING RESTRICTIONS: No   FALLS:  Has patient fallen in last 6 months? No Does feel off balance at times  LIVING ENVIRONMENT: Lives with: lives with their family Lives in: House/apartment Stairs: No Has following equipment at home: None  OCCUPATION: 37 yr old  PLOF: Independent, Vocation/Vocational requirements: standing, walking, and Leisure: likes to walk, cook  PATIENT GOALS: Wants to be able to walk with her friend for fitness   NEXT MD VISIT: Unknown  OBJECTIVE:   DIAGNOSTIC FINDINGS:  None   PATIENT SURVEYS:  Modified Oswestry 17/50   SCREENING FOR RED FLAGS: Bowel or bladder incontinence: No Spinal tumors: No Cauda equina syndrome: No Compression fracture: No Abdominal aneurysm: No  COGNITION: Overall cognitive status: Within functional limits for tasks assessed     SENSATION: WFL  MUSCLE LENGTH: Hamstrings: Right 40 deg; Left 75 deg Thomas test: Right NT deg; Left NT deg  POSTURE: increased lumbar lordosis and anterior pelvic tilt  PALPATION: Pain with palpation to right hamstring, muscle tension, spasm Minimal to no pain in lumbar spine and right posterior hip palpation in prone   LUMBAR ROM:   AROM eval  Flexion WNL , pulling Rt hamstring   Extension 25 % limited pain   Right lateral flexion WFL  Left lateral flexion WFL  Right rotation WFL   Left rotation WFL tightness Rt side    (Blank rows = not tested)  LOWER EXTREMITY ROM:     Passive  Right eval Left eval  Hip flexion WNL WNL  Hip extension    Hip abduction    Hip  adduction    Hip internal rotation WNL WNL  Hip external rotation tight WNL   Knee flexion WNL WNL   Knee extension    Ankle dorsiflexion    Ankle plantarflexion    Ankle inversion    Ankle eversion     (Blank rows = not tested)  LOWER EXTREMITY MMT:    MMT Right eval Left eval  Hip flexion 4 4+  Hip extension    Hip abduction 4-/5 4-/5  Hip adduction    Hip internal rotation    Hip external rotation    Knee flexion 5/5 5/5  Knee extension 5/5 5/5  Ankle dorsiflexion    Ankle plantarflexion    Ankle inversion    Ankle eversion     (Blank rows = not tested)  LUMBAR SPECIAL TESTS:  Prone instability test: NT, Straight leg raise test: Negative, Slump test: Positive, FABER test: Positive, and Trendelenburg sign: NT FABER pain in hip   FUNCTIONAL TESTS:  NT on eval   GAIT: Distance walked: 150 Assistive device utilized: None Level of assistance: Complete [place] Comments: no deviations   TODAY'S TREATMENT:                                                                                                                              DATE: 03/09/23  PATIENT EDUCATION:  Education details: PT/POC, HEP, sciatica vs lumbar radiculopathy Person educated: Patient Education method: Explanation, Demonstration, and Handouts Education comprehension: verbalized understanding, returned demonstration, and needs further education  HOME EXERCISE PROGRAM: Access Code: [email or web address] URL: [email or web address] Date: 03/09/2023 Prepared by: Karie Mainland  Exercises - Supine Lower Trunk Rotation  - 1 x daily - 7 x weekly - 2 sets - 10 reps - 10 hold - Supine Piriformis Stretch with Foot on Ground  - 1-2 x daily - 7 x weekly - 1 sets - 5 reps - 30 hold - Supine LE Neural Mobilization  - 1-3 x daily - 7 x weekly - 1 sets - 5-10 reps - 5 hold - Gastroc Stretch on Wall  - 1-2 x daily - 7 x weekly - 1 sets - 3-5 reps - 30 hold - Seated Figure 4 Piriformis Stretch  - 1-2 x  daily - 7 x weekly - 1 sets - 3-5 reps - 30 hold  ASSESSMENT:  CLINICAL IMPRESSION: Patient is a 38 y.o. female who was seen today for physical therapy evaluation and treatment for low back pain with sciatica R side.    OBJECTIVE IMPAIRMENTS: decreased mobility, difficulty walking, decreased ROM, decreased strength, increased fascial restrictions, increased muscle spasms, impaired flexibility, improper body mechanics, obesity, and pain.   ACTIVITY LIMITATIONS: carrying, lifting, bending, sitting, standing, squatting, sleeping, bathing, hygiene/grooming, locomotion level, and caring for others  PARTICIPATION LIMITATIONS: meal prep, cleaning, interpersonal relationship, driving, shopping, community activity, and occupation  PERSONAL FACTORS: Time since onset of injury/illness/exacerbation and 1-2 comorbidities: obesity, chronic pain   are also affecting patient's functional outcome.   REHAB POTENTIAL: Excellent  CLINICAL DECISION MAKING: Stable/uncomplicated  EVALUATION COMPLEXITY: Low   GOALS: Goals reviewed with patient? Yes    LONG TERM GOALS: Target date: 04/20/2023    Patient will be independent with home exercise program for hip core trunk Baseline: given on eval  Goal status: INITIAL  2.  Patient will be able to complete bathing and grooming, ADLs without increased leg pain Baseline: pain moderate in back and R LE  Goal status: INITIAL  3.  Patient will be able to walk for 20 minutes with min increase in back and leg pain from baseline Baseline: unable to do this due to pain , stops frequently, increased pain  Goal status: INITIAL  4.  Patient will be able to lift moderate-sized items including small child from the floor without increasing back pain Baseline: Limited by pain Goal status: INITIAL  5.  ODI score will improve by 10 points to demonstrate improved functional capacity Baseline: 17/50 Goal status: INITIAL Work PLAN:  PT FREQUENCY: 2x/week  PT  DURATION: 6 weeks  PLANNED INTERVENTIONS: Therapeutic exercises, Therapeutic activity, Neuromuscular re-education, Balance training, Gait training, Patient/Family education, Self Care, Joint mobilization, Dry Needling, Electrical stimulation, Spinal mobilization, Cryotherapy, Moist heat, Manual therapy, and Re-evaluation.  PLAN FOR NEXT SESSION: check HEP, begin  core, manual    Hayzlee Mcsorley, PT 03/10/2023, 7:43 AM    Check all possible CPT codes: 79038 - PT Re-evaluation, 97110- Therapeutic Exercise, [phone]- Neuro Re-education, 97140 - Manual Therapy, 97530 - Therapeutic Activities, 97535 - Self Care, [phone] - Mechanical traction, 97014 - Electrical stimulation (unattended), and 97750 - Physical performance training    Check all conditions that are expected to impact treatment: Morbid obesity and Current pregnancy or recent postpartum   If treatment provided at initial evaluation, no treatment charged due to lack of authorization.

## 2023-03-09 ENCOUNTER — Encounter: Payer: Self-pay | Admitting: Physical Therapy

## 2023-03-09 ENCOUNTER — Ambulatory Visit: Payer: Medicaid Other | Attending: Physician Assistant | Admitting: Physical Therapy

## 2023-03-09 DIAGNOSIS — M5431 Sciatica, right side: Secondary | ICD-10-CM | POA: Diagnosis not present

## 2023-03-09 DIAGNOSIS — M5416 Radiculopathy, lumbar region: Secondary | ICD-10-CM | POA: Diagnosis present

## 2023-03-09 DIAGNOSIS — M79605 Pain in left leg: Secondary | ICD-10-CM

## 2023-03-12 NOTE — Progress Notes (Signed)
Cardiology Office Note:    Date:  03/13/2023   ID:  Candice Kane, DOB March 05, 1986, MRN [id number]  PCP:  Jarold Motto, PA  Cardiologist:  None  Electrophysiologist:  None   Referring MD: Jarold Motto, PA   Chief Complaint  Patient presents with   Chest Pain    History of Present Illness:    Candice Kane is a 37 y.o. female with a hx of gestational diabetes who is referred by Jarold Motto, PA for evaluation of chest pain.  She reports has been having sharp shooting pain on left side of chest.  Pain is on top of left breast.  Was happening every other day but reports none recently.  States that it occurs with rest.  Has not noted a relationship with exercise or stress.  Does report she is short of breath with walking up stairs.  She reports some lightheadedness but denies any syncope.  Reports some swelling in her ankles.  She smokes 1 to 2 cigars on the weekend.  No history of heart disease in her immediate family.   Past Medical History:  Diagnosis Date   Alpha thalassemia silent carrier 06/15/2022   Anemia    History of chlamydia 12/01/2009   Sciatica of right side    Vaginal delivery    2012, 2023   Varicose veins     Past Surgical History:  Procedure Laterality Date   ADENOIDECTOMY     BACK SURGERY  08/14/2021   TONSILLECTOMY      Current Medications: Current Meds  Medication Sig   ibuprofen (ADVIL) 600 MG tablet Take 1 tablet (600 mg total) by mouth every 6 (six) hours.   meloxicam (MOBIC) 15 MG tablet Take 1 tablet (15 mg total) by mouth daily.   norethindrone (MICRONOR) 0.35 MG tablet Take 1 tablet (0.35 mg total) by mouth daily.   [DISCONTINUED] Accu-Chek Softclix Lancets lancets Check capillary blood sugars 4x's daily   [DISCONTINUED] glucose blood (ACCU-CHEK GUIDE) test strip Use to check blood sugars four times a day was instructed     Allergies:   Patient has no known allergies.   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: [employer]  Tobacco Use   Smoking status: Former    Types: Cigars   Smokeless tobacco: Never  Vaping Use   Vaping Use: Never used  Substance and Sexual Activity   Alcohol use: Yes    Comment: Occasionally   Drug use: Not Currently    Types: Marijuana   Sexual activity: Yes    Partners: Male    Birth control/protection: Condom  Other Topics Concern   Not on file  Social History Narrative   [employer]   6 year old son and almost 45 year old (2024)   Mom lives with her   Social Determinants of Corporate investment banker Strain: Not on file  Food Insecurity: Not on file  Transportation Needs: Not on file  Physical Activity: Not on file  Stress: Not on file  Social Connections: Not on file     Family History: The patient's family history includes COPD in her maternal grandfather; Cancer in her mother; Colon cancer in her maternal grandmother; Diabetes  in her father; Hyperlipidemia in her father; Hypertension in her father; Kidney failure in her mother; Stroke in her mother.  ROS:   Please see the history of present illness.     All other systems reviewed and are negative.  EKGs/Labs/Other Studies Reviewed:    The following studies were reviewed today:   EKG:   03/13/23: Normal sinus rhythm, rate 66, nonspecific T wave flattening  Recent Labs: 02/10/2023: ALT 9; BUN 9; Creatinine, Ser 0.70; Hemoglobin 10.6; Platelets 520.0; Potassium 3.8; Sodium 137; TSH 0.64  Recent Lipid Panel    Component Value Date/Time   CHOL 193 01/10/2016 1642   TRIG 116.0 01/10/2016 1642   HDL 39.20 01/10/2016 1642   CHOLHDL 5 01/10/2016 1642   VLDL 23.2 01/10/2016 1642   LDLCALC 130 (H) 01/10/2016 1642    Physical Exam:    VS:  BP 110/82    Pulse 79   Ht 6\' 1"  (1.854 m)   Wt (!) 326 lb 12.8 oz (148.2 kg)   SpO2 96%   BMI 43.12 kg/m     Wt Readings from Last 3 Encounters:  03/13/23 (!) 326 lb 12.8 oz (148.2 kg)  02/10/23 (!) 329 lb (149.2 kg)  07/31/22 (!) 319 lb (144.7 kg)     GEN:  Well nourished, well developed in no acute distress HEENT: Normal NECK: No JVD; No carotid bruits LYMPHATICS: No lymphadenopathy CARDIAC: RRR, no murmurs, rubs, gallops RESPIRATORY:  Clear to auscultation without rales, wheezing or rhonchi  ABDOMEN: Soft, non-tender, non-distended MUSCULOSKELETAL:  No edema; No deformity  SKIN: Warm and dry NEUROLOGIC:  Alert and oriented x 3 PSYCHIATRIC:  Normal affect   ASSESSMENT:    1. Chest pain of uncertain etiology   2. Shortness of breath   3. Daytime somnolence   4. Class 3 severe obesity with body mass index (BMI) of 40.0 to 44.9 in adult, unspecified obesity type, unspecified whether serious comorbidity present    PLAN:    Chest pain: Description suggests noncardiac chest pain, as describes sharp pain on left side of chest, unrelated to exercise or stress.  Reports has not had any chest pain recently.  Given age and lack of risk factors, would hold off on further workup for now  Dyspnea: Reports dyspnea with minimal exertion.  Suspect related to deconditioning.  Check echocardiogram to rule out structural heart disease  Morbid obesity: Body mass index is 43.12 kg/m.  Refer to healthy weight and wellness  Daytime somnolence: check Itamar sleep study.  STOPBANG 3   RTC in 6 months   Medication Adjustments/Labs and Tests Ordered: Current medicines are reviewed at length with the patient today.  Concerns regarding medicines are outlined above.  Orders Placed This Encounter  Procedures   Ambulatory referral to Family Practice   ECHOCARDIOGRAM COMPLETE   Itamar Sleep Study   No orders of the defined types were placed in this encounter.   Patient Instructions  Medication  Instructions:  Your physician recommends that you continue on your current medications as directed. Please refer to the Current Medication list given to you today.  *If you need a refill on your cardiac medications before your next appointment, please call your pharmacy*  Testing/Procedures: Your physician has requested that you have an echocardiogram. Echocardiography is a painless test that uses sound waves to create images of your heart. It provides your doctor with information about the size and shape of your heart and how well your heart's chambers and valves are working. This procedure takes  approximately one hour. There are no restrictions for this procedure. Please do NOT wear cologne, perfume, aftershave, or lotions (deodorant is allowed). Please arrive 15 minutes prior to your appointment time.  WatchPAT?  Is a FDA cleared portable home sleep study test that uses a watch and 3 points of contact to monitor 7 different channels, including your heart rate, oxygen saturations, body position, snoring, and chest motion.  The study is easy to use from the comfort of your own home and accurately detect sleep apnea.  Before bed, you attach the chest sensor, attached the sleep apnea bracelet to your nondominant hand, and attach the finger probe.  After the study, the raw data is downloaded from the watch and scored for apnea events.   For more information: [email or web address]  Patient Testing Instructions:  Do not put battery into the device until bedtime when you are ready to begin the test. Please call the support number if you need assistance after following the instructions below: 24 hour support line- [phone] or ITAMAR support at [phone] (option 2)  Download the IntelWatchPAT One" app through the google play store or App Store  Be sure to turn on or enable access to bluetooth in settlings on your smartphone/ device  Make sure no other bluetooth devices are on  and within the vicinity of your smartphone/ device and WatchPAT watch during testing.  Make sure to leave your smart phone/ device plugged in and charging all night.  When ready for bed:  Follow the instructions step by step in the WatchPAT One App to activate the testing device. For additional instructions, including video instruction, visit the WatchPAT One video on Youtube. You can search for WatchPat One within Youtube (video is 4 minutes and 18 seconds) or enter: https://youtube/watch?[email or web address]_vbiwxE Please note: You will be prompted to enter a Pin to connect via bluetooth when starting the test. The PIN will be assigned to you when you receive the test.  The device is disposable, but it recommended that you retain the device until you receive a call letting you know the study has been received and the results have been interpreted.  We will let you know if the study did not transmit to [place] properly after the test is completed. You do not need to call us to confirm the receipt of the test.  Please complete the test within 48 hours of receiving PIN.   Frequently Asked Questions:  What is Watch Dennie Bible one?  A single use fully disposable home sleep apnea testing device and will not need to be returned after completion.  What are the requirements to use WatchPAT one?  The be able to have a successful watchpat one sleep study, you should have your Watch pat one device, your smart phone, watch pat one app, your PIN number and Internet access What type of phone do I need?  You should have a smart phone that uses Android 5.1 and above or any Iphone with IOS 10 and above How can I download the WatchPAT one app?  Based on your device type search for WatchPAT one app either in google play for android devices or APP store for Iphone's Where will I get my PIN for the study?  Your PIN will be provided by your physician's office. It is used for authentication and if you lose/forget your PIN, please reach out to  your providers office.  I do not have Internet at home. Can I do WatchPAT one study?  WatchPAT One needs Internet  connection throughout the night to be able to transmit the sleep data. You can use your home/local internet or your cellular's data package. However, it is always recommended to use home/local Internet. It is estimated that between 20MB-30MB will be used with each study.However, the application will be looking for 80MB space in the phone to start the study.  What happens if I lose internet or bluetooth connection?  During the internet disconnection, your phone will not be able to transmit the sleep data. All the data, will be stored in your phone. As soon as the internet connection is back on, the phone will being sending the sleep data. During the bluetooth disconnection, WatchPAT one will not be able to to send the sleep data to your phone. Data will be kept in the [facility] until two devices have bluetooth connection back on. As soon as the connection is back on, WatchPAT one will send the sleep data to the phone.  How long do I need to wear the WatchPAT one?  After you start the study, you should wear the device at least 6 hours.  How far should I keep my phone from the device?  During the night, your phone should be within 15 feet.  What happens if I leave the room for restroom or other reasons?  Leaving the room for any reason will not cause any problem. As soon as your get back to the room, both devices will reconnect and will continue to send the sleep data. Can I use my phone during the sleep study?  Yes, you can use your phone as usual during the study. But it is recommended to put your watchpat one on when you are ready to go to bed.  How will I get my study results?  A soon as you completed your study, your sleep data will be sent to the provider. They will then share the results with you when they are ready.   Follow-Up: [facility], you and your health  needs are our priority.  As part of our continuing mission to provide you with exceptional heart care, we have created designated Provider Care Teams.  These Care Teams include your primary Cardiologist (physician) and Advanced Practice Providers (APPs -  Physician Assistants and Nurse Practitioners) who all work together to provide you with the care you need, when you need it.  We recommend signing up for the patient portal called "MyChart".  Sign up information is provided on this After Visit Summary.  MyChart is used to connect with patients for Virtual Visits (Telemedicine).  Patients are able to view lab/test results, encounter notes, upcoming appointments, etc.  Non-urgent messages can be sent to your provider as well.   To learn more about what you can do with MyChart, go to [email or web address].    Your next appointment:   6 month(s)  Provider:   Dr. Bjorn PippinSchumann Other Instructions You have been referred to: [facility]     Signed, Little IshikawaChristopher L Valaria Kohut, MD  03/13/2023 5:04 PM    [facility]

## 2023-03-13 ENCOUNTER — Ambulatory Visit: Payer: Medicaid Other | Attending: Cardiology | Admitting: Cardiology

## 2023-03-13 ENCOUNTER — Encounter: Payer: Self-pay | Admitting: Cardiology

## 2023-03-13 VITALS — BP 110/82 | HR 79 | Ht 73.0 in | Wt 326.8 lb

## 2023-03-13 DIAGNOSIS — Z6841 Body Mass Index (BMI) 40.0 and over, adult: Secondary | ICD-10-CM

## 2023-03-13 DIAGNOSIS — R0602 Shortness of breath: Secondary | ICD-10-CM | POA: Diagnosis not present

## 2023-03-13 DIAGNOSIS — R079 Chest pain, unspecified: Secondary | ICD-10-CM | POA: Diagnosis not present

## 2023-03-13 DIAGNOSIS — R400 Somnolence: Secondary | ICD-10-CM | POA: Diagnosis not present

## 2023-03-13 NOTE — Patient Instructions (Signed)
Medication Instructions:  Your physician recommends that you continue on your current medications as directed. Please refer to the Current Medication list given to you today.  *If you need a refill on your cardiac medications before your next appointment, please call your pharmacy*  Testing/Procedures: Your physician has requested that you have an echocardiogram. Echocardiography is a painless test that uses sound waves to create images of your heart. It provides your doctor with information about the size and shape of your heart and how well your heart's chambers and valves are working. This procedure takes approximately one hour. There are no restrictions for this procedure. Please do NOT wear cologne, perfume, aftershave, or lotions (deodorant is allowed). Please arrive 15 minutes prior to your appointment time.  WatchPAT?  Is a FDA cleared portable home sleep study test that uses a watch and 3 points of contact to monitor 7 different channels, including your heart rate, oxygen saturations, body position, snoring, and chest motion.  The study is easy to use from the comfort of your own home and accurately detect sleep apnea.  Before bed, you attach the chest sensor, attached the sleep apnea bracelet to your nondominant hand, and attach the finger probe.  After the study, the raw data is downloaded from the watch and scored for apnea events.   For more information: [email or web address]  Patient Testing Instructions:  Do not put battery into the device until bedtime when you are ready to begin the test. Please call the support number if you need assistance after following the instructions below: 24 hour support line- [phone] or ITAMAR support at [phone] (option 2)  Download the IntelWatchPAT One" app through the google play store or App Store  Be sure to turn on or enable access to bluetooth in settlings on your smartphone/ device  Make sure no other bluetooth  devices are on and within the vicinity of your smartphone/ device and WatchPAT watch during testing.  Make sure to leave your smart phone/ device plugged in and charging all night.  When ready for bed:  Follow the instructions step by step in the WatchPAT One App to activate the testing device. For additional instructions, including video instruction, visit the WatchPAT One video on Youtube. You can search for WatchPat One within Youtube (video is 4 minutes and 18 seconds) or enter: https://youtube/watch?[email or web address]_vbiwxE Please note: You will be prompted to enter a Pin to connect via bluetooth when starting the test. The PIN will be assigned to you when you receive the test.  The device is disposable, but it recommended that you retain the device until you receive a call letting you know the study has been received and the results have been interpreted.  We will let you know if the study did not transmit to [place] properly after the test is completed. You do not need to call us to confirm the receipt of the test.  Please complete the test within 48 hours of receiving PIN.   Frequently Asked Questions:  What is Watch Dennie Bible one?  A single use fully disposable home sleep apnea testing device and will not need to be returned after completion.  What are the requirements to use WatchPAT one?  The be able to have a successful watchpat one sleep study, you should have your Watch pat one device, your smart phone, watch pat one app, your PIN number and Internet access What type of phone do I need?  You should have a smart phone that uses Android 5.1 and  above or any Iphone with IOS 10 and above How can I download the WatchPAT one app?  Based on your device type search for WatchPAT one app either in google play for android devices or APP store for Iphone's Where will I get my PIN for the study?  Your PIN will be provided by your physician's office. It is used for authentication and if you lose/forget your PIN, please  reach out to your providers office.  I do not have Internet at home. Can I do WatchPAT one study?  WatchPAT One needs Internet connection throughout the night to be able to transmit the sleep data. You can use your home/local internet or your cellular's data package. However, it is always recommended to use home/local Internet. It is estimated that between 20MB-30MB will be used with each study.However, the application will be looking for space in the phone to start the study.  What happens if I lose internet or bluetooth connection?  During the internet disconnection, your phone will not be able to transmit the sleep data. All the data, will be stored in your phone. As soon as the internet connection is back on, the phone will being sending the sleep data. During the bluetooth disconnection, WatchPAT one will not be able to to send the sleep data to your phone. Data will be kept in the [facility] one until two devices have bluetooth connection back on. As soon as the connection is back on, WatchPAT one will send the sleep data to the phone.  How long do I need to wear the WatchPAT one?  After you start the study, you should wear the device at least 6 hours.  How far should I keep my phone from the device?  During the night, your phone should be within 15 feet.  What happens if I leave the room for restroom or other reasons?  Leaving the room for any reason will not cause any problem. As soon as your get back to the room, both devices will reconnect and will continue to send the sleep data. Can I use my phone during the sleep study?  Yes, you can use your phone as usual during the study. But it is recommended to put your watchpat one on when you are ready to go to bed.  How will I get my study results?  A soon as you completed your study, your sleep data will be sent to the provider. They will then share the results with you when they are ready.   Follow-Up: At [facility], you and  your health needs are our priority.  As part of our continuing mission to provide you with exceptional heart care, we have created designated Provider Care Teams.  These Care Teams include your primary Cardiologist (physician) and Advanced Practice Providers (APPs -  Physician Assistants and Nurse Practitioners) who all work together to provide you with the care you need, when you need it.  We recommend signing up for the patient portal called "MyChart".  Sign up information is provided on this After Visit Summary.  MyChart is used to connect with patients for Virtual Visits (Telemedicine).  Patients are able to view lab/test results, encounter notes, upcoming appointments, etc.  Non-urgent messages can be sent to your provider as well.   To learn more about what you can do with MyChart, go to [email or web address].    Your next appointment:   6 month(s)  Provider:   Dr. Bjorn Pippin Other Instructions You have been referred  to: [facility]

## 2023-03-17 ENCOUNTER — Telehealth: Payer: Self-pay

## 2023-03-17 ENCOUNTER — Telehealth: Payer: Self-pay | Admitting: *Deleted

## 2023-03-17 NOTE — Telephone Encounter (Signed)
Secure chat message sent to Stacy Green ok to activate itamar sleep device. 

## 2023-03-17 NOTE — Telephone Encounter (Signed)
I called the patient to speak with her concerning the Itamar Device, LVM to return my call.

## 2023-03-19 ENCOUNTER — Ambulatory Visit: Payer: Medicaid Other

## 2023-03-24 ENCOUNTER — Ambulatory Visit: Payer: Medicaid Other

## 2023-03-24 DIAGNOSIS — M5416 Radiculopathy, lumbar region: Secondary | ICD-10-CM

## 2023-03-24 DIAGNOSIS — M79605 Pain in left leg: Secondary | ICD-10-CM

## 2023-03-24 NOTE — Therapy (Signed)
OUTPATIENT PHYSICAL THERAPY TREATMENT NOTE   Patient Name: Candice Kane MRN: [id number] DOB:05-25-1986, 37 y.o., female Today's Date: 03/24/2023  PCP: Jarold Motto PA   REFERRING PROVIDER: Jarold Motto PA   END OF SESSION:   PT End of Session - 03/24/23 1734     Visit Number 2    Number of Visits 16    Date for PT Re-Evaluation 05/04/23    Authorization Type  Medicaid    Authorization - Number of Visits 27    Progress Note Due on Visit 10    PT Start Time 1420    PT Stop Time 1505    PT Time Calculation (min) 45 min    Activity Tolerance Patient tolerated treatment well    Behavior During Therapy [facility] for tasks assessed/performed             Past Medical History:  Diagnosis Date   Alpha thalassemia silent carrier 06/15/2022   Anemia    History of chlamydia 12/01/2009   Sciatica of right side    Vaginal delivery    2012, 2023   Varicose veins    Past Surgical History:  Procedure Laterality Date   ADENOIDECTOMY     BACK SURGERY  08/14/2021   TONSILLECTOMY     Patient Active Problem List   Diagnosis Date Noted   History of gestational diabetes 02/10/2023   Varicose 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 complications 03/10/2013   Lipoma of back 03/10/2013    REFERRING DIAG: Radiculopathy, lumbar region   THERAPY DIAG:  Radiculopathy, lumbar region  Pain in left leg  Rationale for Evaluation and Treatment Rehabilitation  ONSET DATE: 8 mos ago    SUBJECTIVE:                                                                                                                                                                                            SUBJECTIVE STATEMENT: Pt reports she has been completing her HEP and her low back is feeling the same, no better or worse. Pt reports R posterior leg pain especially when she wakes up in the AM and prolonged standing and walking  PAIN:  Are you having pain? Yes: NPRS scale: 2/10 c lateral r thigh  pain Pain location: Rt buttocks thigh to calf at times  Pain description: achy  Aggravating factors: sitting, pins and needles, numbness, nerve Relieving factors: hot water, meds, MHP in lazy boy.   PERTINENT HISTORY:  See above     PRECAUTIONS: None   WEIGHT BEARING RESTRICTIONS: No   FALLS:  Has patient fallen in last 6 months? No Does feel off  balance at times   LIVING ENVIRONMENT: Lives with: lives with their family Lives in: House/apartment Stairs: No Has following equipment at home: None   OCCUPATION: 37 yr old   PLOF: Independent, Vocation/Vocational requirements: standing, walking, and Leisure: likes to walk, cook   PATIENT GOALS: Wants to be able to walk with her friend for fitness    NEXT MD VISIT: Unknown   OBJECTIVE: (objective measures completed at initial evaluation unless otherwise dated)   DIAGNOSTIC FINDINGS:  None    PATIENT SURVEYS:  Modified Oswestry 17/50    SCREENING FOR RED FLAGS: Bowel or bladder incontinence: No Spinal tumors: No Cauda equina syndrome: No Compression fracture: No Abdominal aneurysm: No   COGNITION: Overall cognitive status: Within functional limits for tasks assessed                          SENSATION: WFL   MUSCLE LENGTH: Hamstrings: Right 40 deg; Left 75 deg Thomas test: Right NT deg; Left NT deg   POSTURE: increased lumbar lordosis and anterior pelvic tilt   PALPATION: Pain with palpation to right hamstring, muscle tension, spasm Minimal to no pain in lumbar spine and right posterior hip palpation in prone     LUMBAR ROM:    AROM eval  Flexion WNL , pulling Rt hamstring   Extension 25 % limited pain   Right lateral flexion WFL  Left lateral flexion WFL  Right rotation WFL   Left rotation WFL tightness Rt side    (Blank rows = not tested)   LOWER EXTREMITY ROM:      Passive  Right eval Left eval  Hip flexion WNL WNL  Hip extension      Hip abduction      Hip adduction      Hip internal  rotation WNL WNL  Hip external rotation tight WNL   Knee flexion WNL WNL   Knee extension      Ankle dorsiflexion      Ankle plantarflexion      Ankle inversion      Ankle eversion       (Blank rows = not tested)   LOWER EXTREMITY MMT:     MMT Right eval Left eval  Hip flexion 4 4+  Hip extension      Hip abduction 4-/5 4-/5  Hip adduction      Hip internal rotation      Hip external rotation      Knee flexion 5/5 5/5  Knee extension 5/5 5/5  Ankle dorsiflexion      Ankle plantarflexion      Ankle inversion      Ankle eversion       (Blank rows = not tested)   LUMBAR SPECIAL TESTS:  Prone instability test: NT, Straight leg raise test: Negative, Slump test: Positive, FABER test: Positive, and Trendelenburg sign: NT FABER pain in hip    FUNCTIONAL TESTS:  NT on eval    GAIT: Distance walked: 150 Assistive device utilized: None Level of assistance: Complete Independence Comments: no deviations    TODAY'S TREATMENT:   [facility] Adult PT Treatment:                                                DATE: 03/24/23 Therapeutic Exercise: Seated swiss ball roll outs forward and laterally, no change  in R L Prone lying, no change in R LE pain Prone press up, increase in in R LE pain PPT, mini crunch 2x10, no increase in pain Bridge, increase in R LE STS  2x10 Updated HEP                                                                                                                             DATE: 03/09/23      PATIENT EDUCATION:  Education details: PT/POC, HEP, sciatica vs lumbar radiculopathy Person educated: Patient Education method: Explanation, Demonstration, and Handouts Education comprehension: verbalized understanding, returned demonstration, and needs further education   HOME EXERCISE PROGRAM: Access Code: [email or web address] URL: [email or web address] Date: 03/24/2023 Prepared by: Joellyn Rued  Exercises - Supine Lower Trunk Rotation  - 1 x daily - 7 x  weekly - 2 sets - 10 reps - 10 hold - Supine Piriformis Stretch with Foot on Ground  - 1-2 x daily - 7 x weekly - 1 sets - 5 reps - 30 hold - Supine LE Neural Mobilization  - 1-3 x daily - 7 x weekly - 1 sets - 5-10 reps - 5 hold - Gastroc Stretch on Wall  - 1-2 x daily - 7 x weekly - 1 sets - 3-5 reps - 30 hold - Seated Figure 4 Piriformis Stretch  - 1-2 x daily - 7 x weekly - 1 sets - 3-5 reps - 30 hold - Curl Up with Reach  - 1 x daily - 7 x weekly - 2 sets - 10 reps - 3 hold - Sit to Stand Without Arm Support  - 1 x daily - 7 x weekly - 2 sets - 10 reps - 3 hold - Seated Flexion Stretch with Swiss Ball  - 1 x daily - 7 x weekly - 1 sets - 8 reps - 5-20 hold  ASSESSMENT:   CLINICAL IMPRESSION:  PT was completed for lumbopelvic flexibility and strengthening with directional preference assessment for pain reduction. Pt responded more positively to flexion biased therex than extension. Pt tolerated PT today without adverse effects. Pt is to completed her HEP tomorrow. Pt will continue to benefit from skilled PT to address impairments for improved function with less pain.   OBJECTIVE IMPAIRMENTS: decreased mobility, difficulty walking, decreased ROM, decreased strength, increased fascial restrictions, increased muscle spasms, impaired flexibility, improper body mechanics, obesity, and pain.    ACTIVITY LIMITATIONS: carrying, lifting, bending, sitting, standing, squatting, sleeping, bathing, hygiene/grooming, locomotion level, and caring for others   PARTICIPATION LIMITATIONS: meal prep, cleaning, interpersonal relationship, driving, shopping, community activity, and occupation   PERSONAL FACTORS: Time since onset of injury/illness/exacerbation and 1-2 comorbidities: obesity, chronic pain   are also affecting patient's functional outcome.    REHAB POTENTIAL: Excellent   CLINICAL DECISION MAKING: Stable/uncomplicated   EVALUATION COMPLEXITY: Low     GOALS: Goals reviewed with patient?  Yes       LONG TERM GOALS: Target date: 04/20/2023  Patient will be independent with home exercise program for hip core trunk Baseline: given on eval  Goal status: INITIAL   2.  Patient will be able to complete bathing and grooming, ADLs without increased leg pain Baseline: pain moderate in back and R LE  Goal status: INITIAL   3.  Patient will be able to walk for 20 minutes with min increase in back and leg pain from baseline Baseline: unable to do this due to pain , stops frequently, increased pain  Goal status: INITIAL   4.  Patient will be able to lift moderate-sized items including small child from the floor without increasing back pain Baseline: Limited by pain Goal status: INITIAL   5.  ODI score will improve by 10 points to demonstrate improved functional capacity Baseline: 17/50 Goal status: INITIAL  PLAN:   PT FREQUENCY: 2x/week   PT DURATION: 6 weeks   PLANNED INTERVENTIONS: Therapeutic exercises, Therapeutic activity, Neuromuscular re-education, Balance training, Gait training, Patient/Family education, Self Care, Joint mobilization, Dry Needling, Electrical stimulation, Spinal mobilization, Cryotherapy, Moist heat, Manual therapy, and Re-evaluation.   PLAN FOR NEXT SESSION: check HEP, begin  core, manual    Tyshika Baldridge MS, PT 03/24/23 5:54 PM

## 2023-03-25 NOTE — Therapy (Signed)
OUTPATIENT PHYSICAL THERAPY TREATMENT NOTE   Patient Name: Candice Kane MRN: [id number] DOB:[birth year], 37 y.o., female Today's Date: 03/26/2023  PCP: Jarold Motto PA   REFERRING PROVIDER: Jarold Motto PA   END OF SESSION:   PT End of Session - 03/26/23 1416     Visit Number 3    Number of Visits 16    Date for PT Re-Evaluation 05/04/23    Authorization Type [place] MEDICAID [employer]    Authorization - Number of Visits 27    Progress Note Due on Visit 10    PT Start Time 1416    PT Stop Time 1458    PT Time Calculation (min) 42 min    Activity Tolerance Patient tolerated treatment well    Behavior During Therapy [facility] for tasks assessed/performed              Past Medical History:  Diagnosis Date   Alpha thalassemia silent carrier 06/15/2022   Anemia    History of chlamydia 12/01/2009   Sciatica of right side    Vaginal delivery    2012, 2023   Varicose veins    Past Surgical History:  Procedure Laterality Date   ADENOIDECTOMY     BACK SURGERY  08/14/2021   TONSILLECTOMY     Patient Active Problem List   Diagnosis Date Noted   History of gestational diabetes 02/10/2023   Varicose 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 complications 03/10/2013   Lipoma of back 03/10/2013    REFERRING DIAG: Radiculopathy, lumbar region   THERAPY DIAG:  Radiculopathy, lumbar region  Pain in left leg  Rationale for Evaluation and Treatment Rehabilitation  ONSET DATE: 8 mos ago    SUBJECTIVE:                                                                                                                                                                                            SUBJECTIVE STATEMENT: Pt reports yesterday was rough day with being up her feet a lot. Today has been good with little pain.  PAIN:  Are you having pain? Yes: NPRS scale: 1/10 c lateral R thigh pain Pain location: Rt buttocks thigh to calf at times  Pain  description: achy  Aggravating factors: sitting, pins and needles, numbness, nerve Relieving factors: hot water, meds, MHP in lazy boy.   PERTINENT HISTORY:  See above     PRECAUTIONS: None   WEIGHT BEARING RESTRICTIONS: No   FALLS:  Has patient fallen in last 6 months? No Does feel off balance at times   LIVING ENVIRONMENT: Lives with: lives with their family Lives in: House/apartment  Stairs: No Has following equipment at home: None   OCCUPATION: 37 yr old   PLOF: Independent, Vocation/Vocational requirements: standing, walking, and Leisure: likes to walk, cook   PATIENT GOALS: Wants to be able to walk with her friend for fitness    NEXT MD VISIT: Unknown   OBJECTIVE: (objective measures completed at initial evaluation unless otherwise dated)   DIAGNOSTIC FINDINGS:  None    PATIENT SURVEYS:  Modified Oswestry 17/50    SCREENING FOR RED FLAGS: Bowel or bladder incontinence: No Spinal tumors: No Cauda equina syndrome: No Compression fracture: No Abdominal aneurysm: No   COGNITION: Overall cognitive status: Within functional limits for tasks assessed                          SENSATION: WFL   MUSCLE LENGTH: Hamstrings: Right 40 deg; Left 75 deg Thomas test: Right NT deg; Left NT deg   POSTURE: increased lumbar lordosis and anterior pelvic tilt   PALPATION: Pain with palpation to right hamstring, muscle tension, spasm Minimal to no pain in lumbar spine and right posterior hip palpation in prone     LUMBAR ROM:    AROM eval  Flexion WNL , pulling Rt hamstring   Extension 25 % limited pain   Right lateral flexion WFL  Left lateral flexion WFL  Right rotation WFL   Left rotation WFL tightness Rt side    (Blank rows = not tested)   LOWER EXTREMITY ROM:      Passive  Right eval Left eval  Hip flexion WNL WNL  Hip extension      Hip abduction      Hip adduction      Hip internal rotation WNL WNL  Hip external rotation tight WNL   Knee flexion  WNL WNL   Knee extension      Ankle dorsiflexion      Ankle plantarflexion      Ankle inversion      Ankle eversion       (Blank rows = not tested)   LOWER EXTREMITY MMT:     MMT Right eval Left eval  Hip flexion 4 4+  Hip extension      Hip abduction 4-/5 4-/5  Hip adduction      Hip internal rotation      Hip external rotation      Knee flexion 5/5 5/5  Knee extension 5/5 5/5  Ankle dorsiflexion      Ankle plantarflexion      Ankle inversion      Ankle eversion       (Blank rows = not tested)   LUMBAR SPECIAL TESTS:  Prone instability test: NT, Straight leg raise test: Negative, Slump test: Positive, FABER test: Positive, and Trendelenburg sign: NT FABER pain in hip    FUNCTIONAL TESTS:  NT on eval    GAIT: Distance walked: 150 Assistive device utilized: None Level of assistance: Complete Independence Comments: no deviations    TODAY'S TREATMENT:   [facility] Adult PT Treatment:                                                DATE: 03/26/23 Therapeutic Exercise: NuStep L 4UE/LE Seated swiss ball roll outs forward and laterally PPT, mini crunch 2x15 Abdominal bracing 90/90 x6 5" STS  x10 Forward flexed  standing hip ext 3x5 each Shoulder ext for back strengthening 2x10 GTB  [facility] Adult PT Treatment:                                                DATE: 03/24/23 Therapeutic Exercise: Seated swiss ball roll outs forward and laterally, no change in R L Prone lying, no change in R LE pain Prone press up, increase in in R LE pain PPT, mini crunch 2x10, no increase in pain Bridge, increase in R LE STS  2x10 Updated HEP                                                                                   PATIENT EDUCATION:  Education details: PT/POC, HEP, sciatica vs lumbar radiculopathy Person educated: Patient Education method: Programmer, multimedia, Demonstration, and Handouts Education comprehension: verbalized understanding, returned demonstration, and needs further  education   HOME EXERCISE PROGRAM: Access Code: [email or web address] URL: [email or web address] Date: 03/26/2023 Prepared by: Joellyn Rued  Exercises - Seated Flexion Stretch with Swiss Ball  - 1 x daily - 7 x weekly - 1 sets - 8 reps - 5-20 hold - Seated Figure 4 Piriformis Stretch  - 1-2 x daily - 7 x weekly - 1 sets - 3-5 reps - 30 hold - Supine Lower Trunk Rotation  - 1 x daily - 7 x weekly - 2 sets - 10 reps - 10 hold - Supine Piriformis Stretch with Foot on Ground  - 1-2 x daily - 7 x weekly - 1 sets - 5 reps - 30 hold - Supine LE Neural Mobilization  - 1-3 x daily - 7 x weekly - 1 sets - 5-10 reps - 5 hold - Gastroc Stretch on Wall  - 1-2 x daily - 7 x weekly - 1 sets - 3-5 reps - 30 hold - Curl Up with Reach  - 1 x daily - 7 x weekly - 2 sets - 10 reps - 3 hold - Sit to Stand Without Arm Support  - 1 x daily - 7 x weekly - 2 sets - 10 reps - 3 hold - Prone Hip Extension on Table  - 1 x daily - 7 x weekly - 2 sets - 10 reps - 3 hold - Shoulder Extension with Resistance  - 1 x daily - 7 x weekly - 2 sets - 10 reps - 3 hold  ASSESSMENT:   CLINICAL IMPRESSION:  PT was completed for lumbopelvic flexibility and strengthening. Pt reports low back pain with bridging, so back/hip ext was completed with neutral positioning. Pt tolerated prescribed therex without adverse effects. Pt reports consistency with HEP. Pt will continue to benefit from skilled PT to address impairments for improved function with less pain.   OBJECTIVE IMPAIRMENTS: decreased mobility, difficulty walking, decreased ROM, decreased strength, increased fascial restrictions, increased muscle spasms, impaired flexibility, improper body mechanics, obesity, and pain.    ACTIVITY LIMITATIONS: carrying, lifting, bending, sitting, standing, squatting, sleeping, bathing, hygiene/grooming, locomotion level, and caring for others   PARTICIPATION LIMITATIONS: meal prep, cleaning, interpersonal relationship,  driving, shopping,  community activity, and occupation   PERSONAL FACTORS: Time since onset of injury/illness/exacerbation and 1-2 comorbidities: obesity, chronic pain   are also affecting patient's functional outcome.    REHAB POTENTIAL: Excellent   CLINICAL DECISION MAKING: Stable/uncomplicated   EVALUATION COMPLEXITY: Low     GOALS: Goals reviewed with patient? Yes       LONG TERM GOALS: Target date: 04/20/2023     Patient will be independent with home exercise program for hip core trunk Baseline: given on eval  Goal status: INITIAL   2.  Patient will be able to complete bathing and grooming, ADLs without increased leg pain Baseline: pain moderate in back and R LE  Goal status: INITIAL   3.  Patient will be able to walk for 20 minutes with min increase in back and leg pain from baseline Baseline: unable to do this due to pain , stops frequently, increased pain  Goal status: INITIAL   4.  Patient will be able to lift moderate-sized items including small child from the floor without increasing back pain Baseline: Limited by pain Goal status: INITIAL   5.  ODI score will improve by 10 points to demonstrate improved functional capacity Baseline: 17/50 Goal status: INITIAL  PLAN:   PT FREQUENCY: 2x/week   PT DURATION: 6 weeks   PLANNED INTERVENTIONS: Therapeutic exercises, Therapeutic activity, Neuromuscular re-education, Balance training, Gait training, Patient/Family education, Self Care, Joint mobilization, Dry Needling, Electrical stimulation, Spinal mobilization, Cryotherapy, Moist heat, Manual therapy, and Re-evaluation.   PLAN FOR NEXT SESSION: check HEP, begin  core, manual    Denitra Donaghey MS, PT 03/26/23 9:56 PM

## 2023-03-26 ENCOUNTER — Ambulatory Visit: Payer: Medicaid Other

## 2023-03-26 DIAGNOSIS — M5416 Radiculopathy, lumbar region: Secondary | ICD-10-CM

## 2023-03-26 DIAGNOSIS — M79605 Pain in left leg: Secondary | ICD-10-CM

## 2023-03-27 NOTE — Therapy (Unsigned)
OUTPATIENT PHYSICAL THERAPY TREATMENT NOTE   Patient Name: Candice Kane MRN: [id number] DOB:[birth year], 37 y.o., female Today's Date: 03/30/2023  PCP: Jarold Motto PA   REFERRING PROVIDER: Jarold Motto PA   END OF SESSION:   PT End of Session - 03/30/23 1507     Visit Number 4    Number of Visits 16    Date for PT Re-Evaluation 05/04/23    Authorization Type [place] MEDICAID [employer]    Authorization - Number of Visits 27    Progress Note Due on Visit 10    PT Start Time 1505    PT Stop Time 1550    PT Time Calculation (min) 45 min    Activity Tolerance Patient tolerated treatment well    Behavior During Therapy [facility] for tasks assessed/performed               Past Medical History:  Diagnosis Date   Alpha thalassemia silent carrier 06/15/2022   Anemia    History of chlamydia 12/01/2009   Sciatica of right side    Vaginal delivery    2012, 2023   Varicose veins    Past Surgical History:  Procedure Laterality Date   ADENOIDECTOMY     BACK SURGERY  08/14/2021   TONSILLECTOMY     Patient Active Problem List   Diagnosis Date Noted   History of gestational diabetes 02/10/2023   Varicose 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 complications 03/10/2013   Lipoma of back 03/10/2013    REFERRING DIAG: Radiculopathy, lumbar region   THERAPY DIAG:  Radiculopathy, lumbar region  Pain in left leg  Rationale for Evaluation and Treatment Rehabilitation  ONSET DATE: 8 mos ago    SUBJECTIVE:                                                                                                                                                                                            SUBJECTIVE STATEMENT: Saturday the leg pain was bad I had to sit and rest.  But it has been less intense less frequent.  PAIN:  Are you having pain? Yes: NPRS scale: 1/10 c lateral R thigh pain Pain location: Rt buttocks thigh to calf at times  Pain description:  achy  Aggravating factors: sitting, pins and needles, numbness, nerve Relieving factors: hot water, meds, MHP in lazy boy.   PERTINENT HISTORY:  See above     PRECAUTIONS: None   WEIGHT BEARING RESTRICTIONS: No   FALLS:  Has patient fallen in last 6 months? No Does feel off balance at times   LIVING ENVIRONMENT: Lives with: lives with their family Lives  in: House/apartment Stairs: No Has following equipment at home: None   OCCUPATION: 37 yr old   PLOF: Independent, Vocation/Vocational requirements: standing, walking, and Leisure: likes to walk, cook   PATIENT GOALS: Wants to be able to walk with her friend for fitness    NEXT MD VISIT: Unknown   OBJECTIVE: (objective measures completed at initial evaluation unless otherwise dated)   DIAGNOSTIC FINDINGS:  None    PATIENT SURVEYS:  Modified Oswestry 17/50    SCREENING FOR RED FLAGS: Bowel or bladder incontinence: No Spinal tumors: No Cauda equina syndrome: No Compression fracture: No Abdominal aneurysm: No   COGNITION: Overall cognitive status: Within functional limits for tasks assessed                          SENSATION: WFL   MUSCLE LENGTH: Hamstrings: Right 40 deg; Left 75 deg Thomas test: Right NT deg; Left NT deg   POSTURE: increased lumbar lordosis and anterior pelvic tilt   PALPATION: Pain with palpation to right hamstring, muscle tension, spasm Minimal to no pain in lumbar spine and right posterior hip palpation in prone     LUMBAR ROM:    AROM eval  Flexion WNL , pulling Rt hamstring   Extension 25 % limited pain   Right lateral flexion WFL  Left lateral flexion WFL  Right rotation WFL   Left rotation WFL tightness Rt side    (Blank rows = not tested)   LOWER EXTREMITY ROM:      Passive  Right eval Left eval  Hip flexion WNL WNL  Hip extension      Hip abduction      Hip adduction      Hip internal rotation WNL WNL  Hip external rotation tight WNL   Knee flexion WNL WNL   Knee  extension      Ankle dorsiflexion      Ankle plantarflexion      Ankle inversion      Ankle eversion       (Blank rows = not tested)   LOWER EXTREMITY MMT:     MMT Right eval Left eval  Hip flexion 4 4+  Hip extension      Hip abduction 4-/5 4-/5  Hip adduction      Hip internal rotation      Hip external rotation      Knee flexion 5/5 5/5  Knee extension 5/5 5/5  Ankle dorsiflexion      Ankle plantarflexion      Ankle inversion      Ankle eversion       (Blank rows = not tested)   LUMBAR SPECIAL TESTS:  Prone instability test: NT, Straight leg raise test: Negative, Slump test: Positive, FABER test: Positive, and Trendelenburg sign: NT FABER pain in hip    FUNCTIONAL TESTS:  NT on eval    GAIT: Distance walked: 150 Assistive device utilized: None Level of assistance: Complete Independence Comments: no deviations    TODAY'S TREATMENT:    [facility] Adult PT Treatment:                                                DATE: 03/30/23 Therapeutic Exercise: NuStep 6 min L6 UE/LE, L6  Knee to chest with towel and active knee extension, nerve flossing Seated figure 4  Supine  core with Pilates circle  Overhead lift, added SLR for neutral spine x 10  Chest lift /ab curl x 10 , used towel under neck to increase neck support  Seated core with Pilates circle  x 10  Slastix for standing extension added high knee march  Palloff press yellow spring x 10 added small rotation away x 10    [facility] Adult PT Treatment:                                                DATE: 03/26/23 Therapeutic Exercise: NuStep L 4UE/LE Seated swiss ball roll outs forward and laterally PPT, mini crunch 2x15 Abdominal bracing 90/90 x6 5" STS  x10 Forward flexed standing hip ext 3x5 each Shoulder ext for back strengthening 2x10 GTB  [facility] Adult PT Treatment:                                                DATE: 03/24/23 Therapeutic Exercise: Seated swiss ball roll outs forward and laterally, no change in  R L Prone lying, no change in R LE pain Prone press up, increase in in R LE pain PPT, mini crunch 2x10, no increase in pain Bridge, increase in R LE STS  2x10 Updated HEP                                                                                   PATIENT EDUCATION:  Education details: PT/POC, HEP, sciatica vs lumbar radiculopathy Person educated: Patient Education method: Programmer, multimedia, Demonstration, and Handouts Education comprehension: verbalized understanding, returned demonstration, and needs further education   HOME EXERCISE PROGRAM: Access Code: [email or web address] URL: [email or web address] Date: 03/26/2023 Prepared by: Joellyn Rued  Exercises - Seated Flexion Stretch with Swiss Ball  - 1 x daily - 7 x weekly - 1 sets - 8 reps - 5-20 hold - Seated Figure 4 Piriformis Stretch  - 1-2 x daily - 7 x weekly - 1 sets - 3-5 reps - 30 hold - Supine Lower Trunk Rotation  - 1 x daily - 7 x weekly - 2 sets - 10 reps - 10 hold - Supine Piriformis Stretch with Foot on Ground  - 1-2 x daily - 7 x weekly - 1 sets - 5 reps - 30 hold - Supine LE Neural Mobilization  - 1-3 x daily - 7 x weekly - 1 sets - 5-10 reps - 5 hold - Gastroc Stretch on Wall  - 1-2 x daily - 7 x weekly - 1 sets - 3-5 reps - 30 hold - Curl Up with Reach  - 1 x daily - 7 x weekly - 2 sets - 10 reps - 3 hold - Sit to Stand Without Arm Support  - 1 x daily - 7 x weekly - 2 sets - 10 reps - 3 hold - Prone Hip Extension on Table  - 1 x daily - 7 x weekly -  2 sets - 10 reps - 3 hold - Shoulder Extension with Resistance  - 1 x daily - 7 x weekly - 2 sets - 10 reps - 3 hold  ASSESSMENT:   CLINICAL IMPRESSION:   Patient reporting min improvement since beginning therapy. She is noticing less pain overall, but pain does still increase down the leg with standing and walking, even 6-7 minutes.  Continued to work on neutral and flexion based core strengthening.  Pain at the end of session was about 3/10 in the right  posterior thigh calf.   OBJECTIVE IMPAIRMENTS: decreased mobility, difficulty walking, decreased ROM, decreased strength, increased fascial restrictions, increased muscle spasms, impaired flexibility, improper body mechanics, obesity, and pain.    ACTIVITY LIMITATIONS: carrying, lifting, bending, sitting, standing, squatting, sleeping, bathing, hygiene/grooming, locomotion level, and caring for others   PARTICIPATION LIMITATIONS: meal prep, cleaning, interpersonal relationship, driving, shopping, community activity, and occupation   PERSONAL FACTORS: Time since onset of injury/illness/exacerbation and 1-2 comorbidities: obesity, chronic pain   are also affecting patient's functional outcome.    REHAB POTENTIAL: Excellent   CLINICAL DECISION MAKING: Stable/uncomplicated   EVALUATION COMPLEXITY: Low     GOALS: Goals reviewed with patient? Yes       LONG TERM GOALS: Target date: 04/20/2023     Patient will be independent with home exercise program for hip core trunk Baseline: given on eval , needs min cues  Goal status: ongoing    2.  Patient will be able to complete bathing and grooming, ADLs without increased leg pain Baseline: pain moderate in back and R LE  Goal status: ongoing    3.  Patient will be able to walk for 20 minutes with min increase in back and leg pain from baseline Baseline: unable to do this due to pain , stops frequently, increased pain  Goal status: ongoing    4.  Patient will be able to lift moderate-sized items including small child from the floor without increasing back pain Baseline: Limited by pain Goal status: ongoing    5.  ODI score will improve by 10 points to demonstrate improved functional capacity Baseline: 17/50 Goal status: ongoing   PLAN:   PT FREQUENCY: 2x/week   PT DURATION: 6 weeks   PLANNED INTERVENTIONS: Therapeutic exercises, Therapeutic activity, Neuromuscular re-education, Balance training, Gait training, Patient/Family  education, Self Care, Joint mobilization, Dry Needling, Electrical stimulation, Spinal mobilization, Cryotherapy, Moist heat, Manual therapy, and Re-evaluation.   PLAN FOR NEXT SESSION: progress  core    Karie Mainland, PT 03/30/23 4:45 PM Phone: [phone] Fax: [phone]

## 2023-03-30 ENCOUNTER — Ambulatory Visit: Payer: Medicaid Other | Admitting: Physical Therapy

## 2023-03-30 ENCOUNTER — Encounter: Payer: Self-pay | Admitting: Physical Therapy

## 2023-03-30 DIAGNOSIS — M79605 Pain in left leg: Secondary | ICD-10-CM

## 2023-03-30 DIAGNOSIS — M5416 Radiculopathy, lumbar region: Secondary | ICD-10-CM | POA: Diagnosis not present

## 2023-04-01 ENCOUNTER — Other Ambulatory Visit: Payer: Self-pay | Admitting: *Deleted

## 2023-04-01 DIAGNOSIS — I83893 Varicose 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 complications: Secondary | ICD-10-CM

## 2023-04-02 ENCOUNTER — Ambulatory Visit: Payer: Medicaid Other | Attending: Physician Assistant

## 2023-04-02 ENCOUNTER — Telehealth: Payer: Self-pay

## 2023-04-02 DIAGNOSIS — M5416 Radiculopathy, lumbar region: Secondary | ICD-10-CM | POA: Insufficient documentation

## 2023-04-02 DIAGNOSIS — M79605 Pain in left leg: Secondary | ICD-10-CM | POA: Insufficient documentation

## 2023-04-02 NOTE — Telephone Encounter (Signed)
LVM re: no show appt for today. Reminded pt of her next PT appt and advised re: attendance policy.

## 2023-04-06 ENCOUNTER — Encounter: Payer: Self-pay | Admitting: Physical Therapy

## 2023-04-06 ENCOUNTER — Ambulatory Visit: Payer: Medicaid Other | Admitting: Physical Therapy

## 2023-04-06 DIAGNOSIS — M79605 Pain in left leg: Secondary | ICD-10-CM

## 2023-04-06 DIAGNOSIS — M5416 Radiculopathy, lumbar region: Secondary | ICD-10-CM

## 2023-04-06 NOTE — Therapy (Signed)
OUTPATIENT PHYSICAL THERAPY TREATMENT NOTE   Patient Name: Candice Kane MRN: [id number] DOB:Jun 22, 1986, 37 y.o., female Today's Date: 04/06/2023  PCP: Jarold Motto PA   REFERRING PROVIDER: Jarold Motto PA   END OF SESSION:   PT End of Session - 04/06/23 1503     Visit Number 5    Number of Visits 16    Date for PT Re-Evaluation 05/04/23    Authorization Type [place] MEDICAID [employer]    PT Start Time 1505    PT Stop Time 1545    PT Time Calculation (min) 40 min               Past Medical History:  Diagnosis Date   Alpha thalassemia silent carrier 06/15/2022   Anemia    History of chlamydia 12/01/2009   Sciatica of right side    Vaginal delivery    2012, 2023   Varicose veins    Past Surgical History:  Procedure Laterality Date   ADENOIDECTOMY     BACK SURGERY  08/14/2021   TONSILLECTOMY     Patient Active Problem List   Diagnosis Date Noted   History of gestational diabetes 02/10/2023   Varicose 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 complications 03/10/2013   Lipoma of back 03/10/2013    REFERRING DIAG: Radiculopathy, lumbar region   THERAPY DIAG:  Radiculopathy, lumbar region  Pain in left leg  Rationale for Evaluation and Treatment Rehabilitation  ONSET DATE: 8 mos ago    SUBJECTIVE:                                                                                                                                                                                            SUBJECTIVE STATEMENT: I struggled over the weekend.  No pain right now.    PAIN:  Are you having pain? Yes: NPRS scale: 0/10 Pain location: Rt buttocks thigh to calf at times  Pain description: achy  Aggravating factors: sitting, pins and needles, numbness, nerve Relieving factors: hot water, meds, MHP in lazy boy.   PERTINENT HISTORY:  See above     PRECAUTIONS: None   WEIGHT BEARING RESTRICTIONS: No   FALLS:  Has patient fallen in last 6  months? No Does feel off balance at times   LIVING ENVIRONMENT: Lives with: lives with their family Lives in: House/apartment Stairs: No Has following equipment at home: None   OCCUPATION: 37 yr old   PLOF: Independent, Vocation/Vocational requirements: standing, walking, and Leisure: likes to walk, cook   PATIENT GOALS: Wants to be able to walk with her friend for fitness    NEXT MD VISIT:  Unknown   OBJECTIVE: (objective measures completed at initial evaluation unless otherwise dated)   DIAGNOSTIC FINDINGS:  None    PATIENT SURVEYS:  Modified Oswestry 17/50    SCREENING FOR RED FLAGS: Bowel or bladder incontinence: No Spinal tumors: No Cauda equina syndrome: No Compression fracture: No Abdominal aneurysm: No   COGNITION: Overall cognitive status: Within functional limits for tasks assessed                          SENSATION: WFL   MUSCLE LENGTH: Hamstrings: Right 40 deg; Left 75 deg Thomas test: Right NT deg; Left NT deg   POSTURE: increased lumbar lordosis and anterior pelvic tilt   PALPATION: Pain with palpation to right hamstring, muscle tension, spasm Minimal to no pain in lumbar spine and right posterior hip palpation in prone     LUMBAR ROM:    AROM eval  Flexion WNL , pulling Rt hamstring   Extension 25 % limited pain   Right lateral flexion WFL  Left lateral flexion WFL  Right rotation WFL   Left rotation WFL tightness Rt side    (Blank rows = not tested)   LOWER EXTREMITY ROM:      Passive  Right eval Left eval  Hip flexion WNL WNL  Hip extension      Hip abduction      Hip adduction      Hip internal rotation WNL WNL  Hip external rotation tight WNL   Knee flexion WNL WNL   Knee extension      Ankle dorsiflexion      Ankle plantarflexion      Ankle inversion      Ankle eversion       (Blank rows = not tested)   LOWER EXTREMITY MMT:     MMT Right eval Left eval  Hip flexion 4 4+  Hip extension      Hip abduction 4-/5 4-/5   Hip adduction      Hip internal rotation      Hip external rotation      Knee flexion 5/5 5/5  Knee extension 5/5 5/5  Ankle dorsiflexion      Ankle plantarflexion      Ankle inversion      Ankle eversion       (Blank rows = not tested)   LUMBAR SPECIAL TESTS:  Prone instability test: NT, Straight leg raise test: Negative, Slump test: Positive, FABER test: Positive, and Trendelenburg sign: NT FABER pain in hip    FUNCTIONAL TESTS:  NT on eval    GAIT: Distance walked: 150 Assistive device utilized: None Level of assistance: Complete Independence Comments: no deviations    TODAY'S TREATMENT:     [facility] Adult PT Treatment:                                                DATE: 04/06/23 Therapeutic Exercise: Supine LTR with ball x 10 for oblique  Hamstring curl  x 10 with ball  Bridge with ball x 10  Bridge without ball  A/P tilt with abd bracing Core bracing with march and clam (green band )  Sit to stand with band  Sit to stand/band  with 10 lbs x 10  Air squat  x 10  Hip abduction /extension in standing x 10 each  Shoulder extension with springboard   [facility] Adult PT Treatment:                                                DATE: 03/30/23 Therapeutic Exercise: NuStep 6 min L6 UE/LE, L6  Knee to chest with towel and active knee extension, nerve flossing Seated figure 4  Supine core with Pilates circle  Overhead lift, added SLR for neutral spine x 10  Chest lift /ab curl x 10 , used towel under neck to increase neck support  Seated core with Pilates circle  x 10  Slastix for standing extension added high knee march  Palloff press yellow spring x 10 added small rotation away x 10    [facility] Adult PT Treatment:                                                DATE: 03/26/23 Therapeutic Exercise: NuStep L 4UE/LE Seated swiss ball roll outs forward and laterally PPT, mini crunch 2x15 Abdominal bracing 90/90 x6 5" STS  x10 Forward flexed standing hip ext 3x5  each Shoulder ext for back strengthening 2x10 GTB  [facility] Adult PT Treatment:                                                DATE: 03/24/23 Therapeutic Exercise: Seated swiss ball roll outs forward and laterally, no change in R L Prone lying, no change in R LE pain Prone press up, increase in in R LE pain PPT, mini crunch 2x10, no increase in pain Bridge, increase in R LE STS  2x10 Updated HEP                                                                                   PATIENT EDUCATION:  Education details: PT/POC, HEP, sciatica vs lumbar radiculopathy Person educated: Patient Education method: Programmer, multimedia, Demonstration, and Handouts Education comprehension: verbalized understanding, returned demonstration, and needs further education   HOME EXERCISE PROGRAM: Access Code: [email or web address] URL: [email or web address] Date: 03/26/2023 Prepared by: Joellyn Rued  Exercises - Seated Flexion Stretch with Swiss Ball  - 1 x daily - 7 x weekly - 1 sets - 8 reps - 5-20 hold - Seated Figure 4 Piriformis Stretch  - 1-2 x daily - 7 x weekly - 1 sets - 3-5 reps - 30 hold - Supine Lower Trunk Rotation  - 1 x daily - 7 x weekly - 2 sets - 10 reps - 10 hold - Supine Piriformis Stretch with Foot on Ground  - 1-2 x daily - 7 x weekly - 1 sets - 5 reps - 30 hold - Supine LE Neural Mobilization  - 1-3 x daily - 7 x weekly - 1 sets - 5-10 reps - 5 hold - Theme park manager  on Wall  - 1-2 x daily - 7 x weekly - 1 sets - 3-5 reps - 30 hold - Curl Up with Reach  - 1 x daily - 7 x weekly - 2 sets - 10 reps - 3 hold - Sit to Stand Without Arm Support  - 1 x daily - 7 x weekly - 2 sets - 10 reps - 3 hold - Prone Hip Extension on Table  - 1 x daily - 7 x weekly - 2 sets - 10 reps - 3 hold - Shoulder Extension with Resistance  - 1 x daily - 7 x weekly - 2 sets - 10 reps - 3 hold  ASSESSMENT:   CLINICAL IMPRESSION:    Pt without pain today.  She reports she has not had severe pain at work lately.  She  did have increased pain Saturday after walking a lot but today she has better.  She has not had any imaging thus far.  She began some standing exercise for core and hips today without increased pain . Will progress HEP next visit.    OBJECTIVE IMPAIRMENTS: decreased mobility, difficulty walking, decreased ROM, decreased strength, increased fascial restrictions, increased muscle spasms, impaired flexibility, improper body mechanics, obesity, and pain.    ACTIVITY LIMITATIONS: carrying, lifting, bending, sitting, standing, squatting, sleeping, bathing, hygiene/grooming, locomotion level, and caring for others   PARTICIPATION LIMITATIONS: meal prep, cleaning, interpersonal relationship, driving, shopping, community activity, and occupation   PERSONAL FACTORS: Time since onset of injury/illness/exacerbation and 1-2 comorbidities: obesity, chronic pain   are also affecting patient's functional outcome.    REHAB POTENTIAL: Excellent   CLINICAL DECISION MAKING: Stable/uncomplicated   EVALUATION COMPLEXITY: Low     GOALS: Goals reviewed with patient? Yes       LONG TERM GOALS: Target date: 04/20/2023     Patient will be independent with home exercise program for hip core trunk Baseline: given on eval , needs min cues  Goal status: ongoing    2.  Patient will be able to complete bathing and grooming, ADLs without increased leg pain Baseline: pain moderate in back and R LE  Goal status: ongoing    3.  Patient will be able to walk for 20 minutes with min increase in back and leg pain from baseline Baseline: unable to do this due to pain , stops frequently, increased pain  Goal status: ongoing    4.  Patient will be able to lift moderate-sized items including small child from the floor without increasing back pain Baseline: Limited by pain Goal status: ongoing    5.  ODI score will improve by 10 points to demonstrate improved functional capacity Baseline: 17/50 Goal status: ongoing    PLAN:   PT FREQUENCY: 2x/week   PT DURATION: 6 weeks   PLANNED INTERVENTIONS: Therapeutic exercises, Therapeutic activity, Neuromuscular re-education, Balance training, Gait training, Patient/Family education, Self Care, Joint mobilization, Dry Needling, Electrical stimulation, Spinal mobilization, Cryotherapy, Moist heat, Manual therapy, and Re-evaluation.   PLAN FOR NEXT SESSION: progress  core    Karie Mainland, PT 04/06/23 4:36 PM Phone: [phone] Fax: [phone]

## 2023-04-07 ENCOUNTER — Ambulatory Visit (HOSPITAL_COMMUNITY)
Admission: RE | Admit: 2023-04-07 | Discharge: 2023-04-07 | Disposition: A | Discharge status: Home / Self Care | Payer: Medicaid Other | Source: Ambulatory Visit | Attending: Vascular Surgery | Admitting: Vascular Surgery

## 2023-04-07 DIAGNOSIS — I83893 Varicose 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 complications: Secondary | ICD-10-CM | POA: Diagnosis not present

## 2023-04-09 ENCOUNTER — Ambulatory Visit: Payer: Medicaid Other

## 2023-04-10 NOTE — Therapy (Deleted)
OUTPATIENT PHYSICAL THERAPY TREATMENT NOTE   Patient Name: Candice Kane MRN: [id number] DOB:1986-06-07, 37 y.o., female Today's Date: 04/10/2023  PCP: Jarold Motto PA   REFERRING PROVIDER: Jarold Motto PA   END OF SESSION:       Past Medical History:  Diagnosis Date   Alpha thalassemia silent carrier 06/15/2022   Anemia    History of chlamydia 12/01/2009   Sciatica of right side    Vaginal delivery    2012, 2023   Varicose veins    Past Surgical History:  Procedure Laterality Date   ADENOIDECTOMY     BACK SURGERY  08/14/2021   TONSILLECTOMY     Patient Active Problem List   Diagnosis Date Noted   History of gestational diabetes 02/10/2023   Varicose 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 complications 03/10/2013   Lipoma of back 03/10/2013    REFERRING DIAG: Radiculopathy, lumbar region   THERAPY DIAG:  No diagnosis found.  Rationale for Evaluation and Treatment Rehabilitation  ONSET DATE: 8 mos ago    SUBJECTIVE:                                                                                                                                                                                            SUBJECTIVE STATEMENT: I struggled over the weekend.  No pain right now.    PAIN:  Are you having pain? Yes: NPRS scale: 0/10 Pain location: Rt buttocks thigh to calf at times  Pain description: achy  Aggravating factors: sitting, pins and needles, numbness, nerve Relieving factors: hot water, meds, MHP in lazy boy.   PERTINENT HISTORY:  See above     PRECAUTIONS: None   WEIGHT BEARING RESTRICTIONS: No   FALLS:  Has patient fallen in last 6 months? No Does feel off balance at times   LIVING ENVIRONMENT: Lives with: lives with their family Lives in: House/apartment Stairs: No Has following equipment at home: None   OCCUPATION: 37 yr old   PLOF: Independent, Vocation/Vocational requirements: standing, walking, and Leisure: likes to  walk, cook   PATIENT GOALS: Wants to be able to walk with her friend for fitness    NEXT MD VISIT: Unknown   OBJECTIVE: (objective measures completed at initial evaluation unless otherwise dated)   DIAGNOSTIC FINDINGS:  None    PATIENT SURVEYS:  Modified Oswestry 17/50    SCREENING FOR RED FLAGS: Bowel or bladder incontinence: No Spinal tumors: No Cauda equina syndrome: No Compression fracture: No Abdominal aneurysm: No   COGNITION: Overall cognitive status: Within functional limits for tasks assessed  SENSATION: WFL   MUSCLE LENGTH: Hamstrings: Right 40 deg; Left 75 deg Thomas test: Right NT deg; Left NT deg   POSTURE: increased lumbar lordosis and anterior pelvic tilt   PALPATION: Pain with palpation to right hamstring, muscle tension, spasm Minimal to no pain in lumbar spine and right posterior hip palpation in prone     LUMBAR ROM:    AROM eval  Flexion WNL , pulling Rt hamstring   Extension 25 % limited pain   Right lateral flexion WFL  Left lateral flexion WFL  Right rotation WFL   Left rotation WFL tightness Rt side    (Blank rows = not tested)   LOWER EXTREMITY ROM:      Passive  Right eval Left eval  Hip flexion WNL WNL  Hip extension      Hip abduction      Hip adduction      Hip internal rotation WNL WNL  Hip external rotation tight WNL   Knee flexion WNL WNL   Knee extension      Ankle dorsiflexion      Ankle plantarflexion      Ankle inversion      Ankle eversion       (Blank rows = not tested)   LOWER EXTREMITY MMT:     MMT Right eval Left eval  Hip flexion 4 4+  Hip extension      Hip abduction 4-/5 4-/5  Hip adduction      Hip internal rotation      Hip external rotation      Knee flexion 5/5 5/5  Knee extension 5/5 5/5  Ankle dorsiflexion      Ankle plantarflexion      Ankle inversion      Ankle eversion       (Blank rows = not tested)   LUMBAR SPECIAL TESTS:  Prone instability test: NT,  Straight leg raise test: Negative, Slump test: Positive, FABER test: Positive, and Trendelenburg sign: NT FABER pain in hip    FUNCTIONAL TESTS:  NT on eval    GAIT: Distance walked: 150 Assistive device utilized: None Level of assistance: Complete Independence Comments: no deviations    TODAY'S TREATMENT:    [facility] Adult PT Treatment:                                                DATE: 04/13/23 Therapeutic Exercise: *** Manual Therapy: *** Neuromuscular re-ed: *** Therapeutic Activity: *** Modalities: *** Self Care: ***  [facility] Treatment:                                                DATE: 04/06/23 Therapeutic Exercise: Supine LTR with ball x 10 for oblique  Hamstring curl  x 10 with ball  Bridge with ball x 10  Bridge without ball  A/P tilt with abd bracing Core bracing with march and clam (green band )  Sit to stand with band  Sit to stand/band  with 10 lbs x 10  Air squat  x 10  Hip abduction /extension in standing x 10 each  Shoulder extension with springboard   [facility] Adult PT Treatment:  DATE: 03/30/23 Therapeutic Exercise: NuStep 6 min L6 UE/LE, L6  Knee to chest with towel and active knee extension, nerve flossing Seated figure 4  Supine core with Pilates circle  Overhead lift, added SLR for neutral spine x 10  Chest lift /ab curl x 10 , used towel under neck to increase neck support  Seated core with Pilates circle  x 10  Slastix for standing extension added high knee march  Palloff press yellow spring x 10 added small rotation away x 10    [facility] Adult PT Treatment:                                                DATE: 03/26/23 Therapeutic Exercise: NuStep L 4UE/LE Seated swiss ball roll outs forward and laterally PPT, mini crunch 2x15 Abdominal bracing 90/90 x6 5" STS  x10 Forward flexed standing hip ext 3x5 each Shoulder ext for back strengthening 2x10 GTB  [facility] Adult PT Treatment:                                                 DATE: 03/24/23 Therapeutic Exercise: Seated swiss ball roll outs forward and laterally, no change in R L Prone lying, no change in R LE pain Prone press up, increase in in R LE pain PPT, mini crunch 2x10, no increase in pain Bridge, increase in R LE STS  2x10 Updated HEP                                                                                   PATIENT EDUCATION:  Education details: PT/POC, HEP, sciatica vs lumbar radiculopathy Person educated: Patient Education method: Programmer, multimedia, Demonstration, and Handouts Education comprehension: verbalized understanding, returned demonstration, and needs further education   HOME EXERCISE PROGRAM: Access Code: [email or web address] URL: [email or web address] Date: 03/26/2023 Prepared by: Joellyn Rued  Exercises - Seated Flexion Stretch with Swiss Ball  - 1 x daily - 7 x weekly - 1 sets - 8 reps - 5-20 hold - Seated Figure 4 Piriformis Stretch  - 1-2 x daily - 7 x weekly - 1 sets - 3-5 reps - 30 hold - Supine Lower Trunk Rotation  - 1 x daily - 7 x weekly - 2 sets - 10 reps - 10 hold - Supine Piriformis Stretch with Foot on Ground  - 1-2 x daily - 7 x weekly - 1 sets - 5 reps - 30 hold - Supine LE Neural Mobilization  - 1-3 x daily - 7 x weekly - 1 sets - 5-10 reps - 5 hold - Gastroc Stretch on Wall  - 1-2 x daily - 7 x weekly - 1 sets - 3-5 reps - 30 hold - Curl Up with Reach  - 1 x daily - 7 x weekly - 2 sets - 10 reps - 3 hold - Sit to Stand Without Arm Support  - 1 x daily - 7  x weekly - 2 sets - 10 reps - 3 hold - Prone Hip Extension on Table  - 1 x daily - 7 x weekly - 2 sets - 10 reps - 3 hold - Shoulder Extension with Resistance  - 1 x daily - 7 x weekly - 2 sets - 10 reps - 3 hold  ASSESSMENT:   CLINICAL IMPRESSION:    Pt without pain today.  She reports she has not had severe pain at work lately.  She did have increased pain Saturday after walking a lot but today she has better.  She has  not had any imaging thus far.  She began some standing exercise for core and hips today without increased pain . Will progress HEP next visit.    OBJECTIVE IMPAIRMENTS: decreased mobility, difficulty walking, decreased ROM, decreased strength, increased fascial restrictions, increased muscle spasms, impaired flexibility, improper body mechanics, obesity, and pain.    ACTIVITY LIMITATIONS: carrying, lifting, bending, sitting, standing, squatting, sleeping, bathing, hygiene/grooming, locomotion level, and caring for others   PARTICIPATION LIMITATIONS: meal prep, cleaning, interpersonal relationship, driving, shopping, community activity, and occupation   PERSONAL FACTORS: Time since onset of injury/illness/exacerbation and 1-2 comorbidities: obesity, chronic pain   are also affecting patient's functional outcome.    REHAB POTENTIAL: Excellent   CLINICAL DECISION MAKING: Stable/uncomplicated   EVALUATION COMPLEXITY: Low     GOALS: Goals reviewed with patient? Yes       LONG TERM GOALS: Target date: 04/20/2023     Patient will be independent with home exercise program for hip core trunk Baseline: given on eval , needs min cues  Goal status: ongoing    2.  Patient will be able to complete bathing and grooming, ADLs without increased leg pain Baseline: pain moderate in back and R LE  Goal status: ongoing    3.  Patient will be able to walk for 20 minutes with min increase in back and leg pain from baseline Baseline: unable to do this due to pain , stops frequently, increased pain  Goal status: ongoing    4.  Patient will be able to lift moderate-sized items including small child from the floor without increasing back pain Baseline: Limited by pain Goal status: ongoing    5.  ODI score will improve by 10 points to demonstrate improved functional capacity Baseline: 17/50 Goal status: ongoing   PLAN:   PT FREQUENCY: 2x/week   PT DURATION: 6 weeks   PLANNED INTERVENTIONS:  Therapeutic exercises, Therapeutic activity, Neuromuscular re-education, Balance training, Gait training, Patient/Family education, Self Care, Joint mobilization, Dry Needling, Electrical stimulation, Spinal mobilization, Cryotherapy, Moist heat, Manual therapy, and Re-evaluation.   PLAN FOR NEXT SESSION: progress  core    Karie Mainland, PT 04/10/23 8:44 AM Phone: [phone] Fax: [phone]

## 2023-04-13 ENCOUNTER — Ambulatory Visit: Payer: Medicaid Other | Admitting: Physical Therapy

## 2023-04-13 ENCOUNTER — Encounter: Payer: Self-pay | Admitting: Physical Therapy

## 2023-04-13 ENCOUNTER — Telehealth: Payer: Self-pay | Admitting: Physical Therapy

## 2023-04-13 NOTE — Telephone Encounter (Signed)
Pt was called regarding her PT appt today at 3:00, which she did not show for.  Left voicemail reminding her of her next appt which is her last appt scheduled in addition to a reminder of the attendance policy.  She will be discharged if she does not come to this next appt.   Karie Mainland, PT 04/13/23 3:37 PM Phone: [phone] Fax: [phone]

## 2023-04-13 NOTE — Progress Notes (Unsigned)
[facility] [place]  ASSESSMENT / PLAN: Candice Kane is a 37 y.o. female with chronic venous insufficiency of bilateral lower extremity causing swelling (C3 disease).  Venous duplex is significant for saphenous vein reflux. She does not have reflux at the saphenofemoral junction. Recommend compression and elevation for symptomatic relief. She needs weight loss - I have encouraged her to follow up with her pcp to discuss medical weight loss. I counseled her that stab phlebectomy alone may be feasible, but she may suffer recurrence. She will discuss this with our vein surgeons in 6 months time.   CHIEF COMPLAINT: varicosities  HISTORY OF PRESENT ILLNESS: Candice Kane is a 37 y.o. female clinic for evaluation of varicosities of bilateral lower extremities.  She reports a strong family history of the same.  She was counseled previously to wait till she was done with having children before undergoing any had a venous procedure.  She now has a happy healthy 54-month-old child and is done with having anymore children.  She is very tall, but still overweight.  She struggles with exercise therapy because of ongoing sciatica problems.  She is very interested in medical weight loss.  We reviewed her noninvasive testing in detail.  I counseled her that styloidectomy may be technically feasible, but would be at high risk for recurrence.  She is understanding.   Past Medical History:  Diagnosis Date   Alpha thalassemia silent carrier 06/15/2022   Anemia    History of chlamydia 12/01/2009   Sciatica of right side    Vaginal delivery    2012, 2023   Varicose veins     Past Surgical History:  Procedure Laterality Date   ADENOIDECTOMY     BACK SURGERY  08/14/2021   TONSILLECTOMY      Family History  Problem Relation Age of Onset   Cancer Mother        breast   Kidney failure Mother    Stroke Mother    Diabetes Father    Hyperlipidemia Father    Hypertension Father     Colon cancer Maternal Grandmother        colon   COPD Maternal Grandfather     Social History   Socioeconomic History   Marital status: Single    Spouse name: Not on file   Number of children: Not on file   Years of education: Not on file   Highest education level: Not on file  Occupational History   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: [employer]  Tobacco Use   Smoking status: Former    Types: Cigars   Smokeless tobacco: Never  Vaping Use   Vaping Use: Never used  Substance and Sexual Activity   Alcohol use: Yes    Comment: Occasionally   Drug use: Not Currently    Types: Marijuana   Sexual activity: Yes    Partners: Male    Birth control/protection: Condom  Other Topics Concern   Not on file  Social History Narrative   [employer]   39 year old son and almost 4 year old (2024)   Mom lives with her   Social Determinants of Corporate investment banker Strain: Not on file  Food Insecurity: Not on file  Transportation Needs: Not on file  Physical Activity: Not on file  Stress: Not on file  Social Connections: Not on file  Intimate Partner Violence: Not on file    No Known Allergies  Current  Outpatient Medications  Medication Sig Dispense Refill   ibuprofen (ADVIL) 600 MG tablet Take 1 tablet (600 mg total) by mouth every 6 (six) hours. 30 tablet 0   meloxicam (MOBIC) 15 MG tablet Take 1 tablet (15 mg total) by mouth daily. 30 tablet 0   norethindrone (MICRONOR) 0.35 MG tablet Take 1 tablet (0.35 mg total) by mouth daily. 84 tablet 1   No current facility-administered medications for this visit.    PHYSICAL EXAM There were no vitals filed for this visit.  Well-appearing young woman in no acute distress bees. Regular rate and rhythm Unlabored breathing Scattered clusters of prominent varicosities about the calves and popliteal fossae. 2+ dorsalis pedis pulses  PERTINENT LABORATORY AND RADIOLOGIC  DATA  Most recent CBC    Latest Ref Rng & Units 02/10/2023   10:55 AM 06/15/2022    4:31 PM 06/15/2022    4:59 AM  CBC  WBC 4.0 - 10.5 K/uL 6.8  7.8  9.4   Hemoglobin 12.0 - 15.0 g/dL 16.1  09.6  04.5   Hematocrit 36.0 - 46.0 % 33.0  32.0  32.1   Platelets 150.0 - 400.0 K/uL 520.0  383  452      Most recent CMP    Latest Ref Rng & Units 02/10/2023   10:55 AM 06/15/2022    4:59 AM 06/14/2022    6:37 PM  CMP  Glucose 70 - 99 mg/dL 95  409  83   BUN 6 - 23 mg/dL 9  6  6    Creatinine 0.40 - 1.20 mg/dL 8.11  9.14  7.82   Sodium 135 - 145 mEq/L 137  135  135   Potassium 3.5 - 5.1 mEq/L 3.8  3.8  3.9   Chloride 96 - 112 mEq/L 100  101  105   CO2 19 - 32 mEq/L 29  20  20    Calcium 8.4 - 10.5 mg/dL 8.9  9.0  8.7   Total Protein 6.0 - 8.3 g/dL 6.8  6.3  6.4   Total Bilirubin 0.2 - 1.2 mg/dL 0.3  0.4  0.3   Alkaline Phos 39 - 117 U/L 87  135  137   AST 0 - 37 U/L 10  16  11    ALT 0 - 35 U/L 9  10  8      Renal function CrCl cannot be calculated (Patient's most recent lab result is older than the maximum 21 days allowed.).  Hgb A1c MFr Bld (%)  Date Value  12/17/2021 6.6 (H)    LDL Cholesterol  Date Value Ref Range Status  01/10/2016 130 (H) 0 - 99 mg/dL Final    Right lower extremity venous reflux:  - No evidence of deep vein thrombosis seen in the right lower extremity,  from the common femoral through the popliteal veins.  - No evidence of superficial venous thrombosis in the right lower  extremity.  - Venous reflux is noted in the right greater saphenous vein in the thigh.  - Venous reflux is noted in the right greater saphenous vein in the calf.   Rande Brunt. Lenell Antu, MD FACS [facility] Phone Number: [phone] 04/13/2023 10:22 AM   Total time spent on preparing this encounter including chart review, data review, collecting history, examining the patient, coordinating care for this new patient, 45 minutes.  Portions of this  report may have been transcribed using voice recognition software.  Every effort has been made to ensure accuracy; however,  inadvertent computerized transcription errors may still be present.

## 2023-04-14 ENCOUNTER — Ambulatory Visit (INDEPENDENT_AMBULATORY_CARE_PROVIDER_SITE_OTHER): Payer: Medicaid Other | Admitting: Vascular Surgery

## 2023-04-14 ENCOUNTER — Encounter: Payer: Self-pay | Admitting: Vascular Surgery

## 2023-04-14 VITALS — BP 113/79 | HR 75 | Temp 98.0°F | Resp 20 | Ht 73.0 in | Wt 327.0 lb

## 2023-04-14 DIAGNOSIS — M7989 Other specified soft tissue disorders: Secondary | ICD-10-CM

## 2023-04-14 DIAGNOSIS — I872 Venous insufficiency (chronic) (peripheral): Secondary | ICD-10-CM | POA: Diagnosis not present

## 2023-04-15 ENCOUNTER — Telehealth: Payer: Self-pay

## 2023-04-15 NOTE — Telephone Encounter (Signed)
I called the patient to give her the pin # for the watch, I LVM for her to return my call.

## 2023-04-15 NOTE — Telephone Encounter (Signed)
Called and made the patient aware that SHE may proceed with the [facility] Study. PIN # provided to the patient. Patient made aware that SHE will be contacted after the test has been read with the results and any recommendations. Patient verbalized understanding and thanked me for the call.   

## 2023-04-15 NOTE — Therapy (Addendum)
OUTPATIENT PHYSICAL THERAPY TREATMENT NOTE DISCHARGE   Patient Name: Candice Kane MRN: [id number] DOB:01-19-1986, 37 y.o., female Today's Date: 04/16/2023  PCP: Jarold Motto PA   REFERRING PROVIDER: Jarold Motto PA   END OF SESSION:   PT End of Session - 04/16/23 1507     Visit Number 6    Number of Visits 16    Date for PT Re-Evaluation 05/04/23    Authorization Type [place] MEDICAID [employer]    Authorization - Number of Visits 27    Progress Note Due on Visit 10    PT Start Time 1504    PT Stop Time 1545    PT Time Calculation (min) 41 min    Activity Tolerance Patient tolerated treatment well    Behavior During Therapy [facility] for tasks assessed/performed               Past Medical History:  Diagnosis Date   Alpha thalassemia silent carrier 06/15/2022   Anemia    History of chlamydia 12/01/2009   Sciatica of right side    Vaginal delivery    2012, 2023   Varicose veins    Past Surgical History:  Procedure Laterality Date   ADENOIDECTOMY     BACK SURGERY  08/14/2021   TONSILLECTOMY     Patient Active Problem List   Diagnosis Date Noted   History of gestational diabetes 02/10/2023   Varicose 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 complications 03/10/2013   Lipoma of back 03/10/2013    REFERRING DIAG: Radiculopathy, lumbar region   THERAPY DIAG:  Radiculopathy, lumbar region  Pain in left leg  Rationale for Evaluation and Treatment Rehabilitation  ONSET DATE: 8 mos ago    SUBJECTIVE:                                                                                                                                                                                            SUBJECTIVE STATEMENT: Pt reports overall she is doing much better. Prolonged standing and walking is much better. 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  PAIN:  Are you having pain? Yes: NPRS scale: 0/10 Pain location: Rt  buttocks thigh to calf at times  Pain description: achy  Aggravating factors: sitting, pins and needles, numbness, nerve Relieving factors: hot water, meds, MHP in lazy boy.   PERTINENT HISTORY:  See above     PRECAUTIONS: None   WEIGHT BEARING RESTRICTIONS: No   FALLS:  Has patient fallen in last 6 months? No Does feel off balance at times   LIVING ENVIRONMENT: Lives with: lives  with their family Lives in: House/apartment Stairs: No Has following equipment at home: None   OCCUPATION: 37 yr old   PLOF: Independent, Vocation/Vocational requirements: standing, walking, and Leisure: likes to walk, cook   PATIENT GOALS: Wants to be able to walk with her friend for fitness    NEXT MD VISIT: Unknown   OBJECTIVE: (objective measures completed at initial evaluation unless otherwise dated)   DIAGNOSTIC FINDINGS:  None    PATIENT SURVEYS:  Modified Oswestry 17/50    SCREENING FOR RED FLAGS: Bowel or bladder incontinence: No Spinal tumors: No Cauda equina syndrome: No Compression fracture: No Abdominal aneurysm: No   COGNITION: Overall cognitive status: Within functional limits for tasks assessed                          SENSATION: WFL   MUSCLE LENGTH: Hamstrings: Right 40 deg; Left 75 deg Thomas test: Right NT deg; Left NT deg   POSTURE: increased lumbar lordosis and anterior pelvic tilt   PALPATION: Pain with palpation to right hamstring, muscle tension, spasm Minimal to no pain in lumbar spine and right posterior hip palpation in prone     LUMBAR ROM:    AROM eval  Flexion WNL , pulling Rt hamstring   Extension 25 % limited pain   Right lateral flexion WFL  Left lateral flexion WFL  Right rotation WFL   Left rotation WFL tightness Rt side    (Blank rows = not tested)   LOWER EXTREMITY ROM:      Passive  Right eval Left eval  Hip flexion WNL WNL  Hip extension      Hip abduction      Hip adduction      Hip internal rotation WNL WNL  Hip  external rotation tight WNL   Knee flexion WNL WNL   Knee extension      Ankle dorsiflexion      Ankle plantarflexion      Ankle inversion      Ankle eversion       (Blank rows = not tested)   LOWER EXTREMITY MMT:     MMT Right eval Left eval  Hip flexion 4 4+  Hip extension      Hip abduction 4-/5 4-/5  Hip adduction      Hip internal rotation      Hip external rotation      Knee flexion 5/5 5/5  Knee extension 5/5 5/5  Ankle dorsiflexion      Ankle plantarflexion      Ankle inversion      Ankle eversion       (Blank rows = not tested)   LUMBAR SPECIAL TESTS:  Prone instability test: NT, Straight leg raise test: Negative, Slump test: Positive, FABER test: Positive, and Trendelenburg sign: NT FABER pain in hip    FUNCTIONAL TESTS:  NT on eval    GAIT: Distance walked: 150 Assistive device utilized: None Level of assistance: Complete Independence Comments: no deviations    TODAY'S TREATMENT:   [facility] Adult PT Treatment:                                                DATE: 04/16/23 Therapeutic Exercise: Supine LTR with ball x 10 for oblique  Hamstring curl  x 10 with ball  Bridge with ball  x 10  A/P tilt with abd bracing x10 90/90 abd bracing arms/legs x5 Hinged hip STS c dowel x10 Hinged hip lifting 15# from stool Shoulder extension with springboard   [facility] Adult PT Treatment:                                                DATE: 04/06/23 Therapeutic Exercise: Supine LTR with ball x 10 for oblique  Hamstring curl  x 10 with ball  Bridge with ball x 10  Bridge without ball  A/P tilt with abd bracing Core bracing with march and clam (green band )  Sit to stand with band  Sit to stand/band  with 10 lbs x 10  Air squat  x 10  Hip abduction /extension in standing x 10 each  Shoulder extension with springboard   [facility] Adult PT Treatment:                                                DATE: 03/30/23 Therapeutic Exercise: NuStep 6 min L6 UE/LE, L6  Knee to chest  with towel and active knee extension, nerve flossing Seated figure 4  Supine core with Pilates circle  Overhead lift, added SLR for neutral spine x 10  Chest lift /ab curl x 10 , used towel under neck to increase neck support  Seated core with Pilates circle  x 10  Slastix for standing extension added high knee march  Palloff press yellow spring x 10 added small rotation away x 10    [facility] Adult PT Treatment:                                                DATE: 03/26/23 Therapeutic Exercise: NuStep L 4UE/LE Seated swiss ball roll outs forward and laterally PPT, mini crunch 2x15 Abdominal bracing 90/90 x6 5" STS  x10 Forward flexed standing hip ext 3x5 each Shoulder ext for back strengthening 2x10 GTB                                                                               PATIENT EDUCATION:  Education details: PT/POC, HEP, sciatica vs lumbar radiculopathy Person educated: Patient Education method: Explanation, Demonstration, and Handouts Education comprehension: verbalized understanding, returned demonstration, and needs further education   HOME EXERCISE PROGRAM: Access Code: [email or web address] URL: [email or web address] Date: 03/26/2023 Prepared by: Joellyn Rued  Exercises - Seated Flexion Stretch with Swiss Ball  - 1 x daily - 7 x weekly - 1 sets - 8 reps - 5-20 hold - Seated Figure 4 Piriformis Stretch  - 1-2 x daily - 7 x weekly - 1 sets - 3-5 reps - 30 hold - Supine Lower Trunk Rotation  - 1 x daily - 7 x weekly - 2 sets - 10 reps - 10 hold -  Supine Piriformis Stretch with Foot on Ground  - 1-2 x daily - 7 x weekly - 1 sets - 5 reps - 30 hold - Supine LE Neural Mobilization  - 1-3 x daily - 7 x weekly - 1 sets - 5-10 reps - 5 hold - Gastroc Stretch on Wall  - 1-2 x daily - 7 x weekly - 1 sets - 3-5 reps - 30 hold - Curl Up with Reach  - 1 x daily - 7 x weekly - 2 sets - 10 reps - 3 hold - Sit to Stand Without Arm Support  - 1 x daily - 7 x weekly - 2 sets - 10  reps - 3 hold - Prone Hip Extension on Table  - 1 x daily - 7 x weekly - 2 sets - 10 reps - 3 hold - Shoulder Extension with Resistance  - 1 x daily - 7 x weekly - 2 sets - 10 reps - 3 hold  ASSESSMENT:   CLINICAL IMPRESSION: Pt continues to reports improved pain and tolerance to prolonged standing/walking. PT was completed for lumbopelvic flexibility and strengthening. Hihed hip lifting was completed for 15#. Pt tolerated without adverse effects. Will progress lifting demand as tolerated. Pt will continue to benefit from skilled PT to address impairments for improved function.  OBJECTIVE IMPAIRMENTS: decreased mobility, difficulty walking, decreased ROM, decreased strength, increased fascial restrictions, increased muscle spasms, impaired flexibility, improper body mechanics, obesity, and pain.    ACTIVITY LIMITATIONS: carrying, lifting, bending, sitting, standing, squatting, sleeping, bathing, hygiene/grooming, locomotion level, and caring for others   PARTICIPATION LIMITATIONS: meal prep, cleaning, interpersonal relationship, driving, shopping, community activity, and occupation   PERSONAL FACTORS: Time since onset of injury/illness/exacerbation and 1-2 comorbidities: obesity, chronic pain   are also affecting patient's functional outcome.    REHAB POTENTIAL: Excellent   CLINICAL DECISION MAKING: Stable/uncomplicated   EVALUATION COMPLEXITY: Low     GOALS: Goals reviewed with patient? Yes       LONG TERM GOALS: Target date: 04/20/2023     Patient will be independent with home exercise program for hip core trunk Baseline: given on eval , needs min cues  Goal status: ongoing    2.  Patient will be able to complete bathing and grooming, ADLs without increased leg pain Baseline: pain moderate in back and R LE  Goal status: ongoing    3.  Patient will be able to walk for 20 minutes with min increase in back and leg pain from baseline Baseline: unable to do this due to pain ,  stops frequently, increased pain  Goal status: ongoing    4.  Patient will be able to lift moderate-sized items including small child from the floor without increasing back pain Baseline: Limited by pain Goal status: ongoing    5.  ODI score will improve by 10 points to demonstrate improved functional capacity Baseline: 17/50 Goal status: ongoing   PLAN:   PT FREQUENCY: 2x/week   PT DURATION: 6 weeks   PLANNED INTERVENTIONS: Therapeutic exercises, Therapeutic activity, Neuromuscular re-education, Balance training, Gait training, Patient/Family education, Self Care, Joint mobilization, Dry Needling, Electrical stimulation, Spinal mobilization, Cryotherapy, Moist heat, Manual therapy, and Re-evaluation.   PLAN FOR NEXT SESSION: progress  core    Seanne Chirico MS, PT 04/16/23 3:49 PM   PHYSICAL THERAPY DISCHARGE SUMMARY  Visits from Start of Care: 6  Current functional level related to goals / functional outcomes: Unknown, see above    Remaining deficits: Unknown  Education / Equipment: HEP, core    Patient agrees to discharge. Patient goals were partially met. Patient is being discharged due to not returning since the last visit.  Karie Mainland, PT 06/02/23 12:12 PM Phone: [phone] Fax: [phone]

## 2023-04-16 ENCOUNTER — Ambulatory Visit (HOSPITAL_COMMUNITY): Payer: Medicaid Other | Attending: Cardiology

## 2023-04-16 ENCOUNTER — Ambulatory Visit: Payer: Medicaid Other

## 2023-04-16 DIAGNOSIS — R0602 Shortness of breath: Secondary | ICD-10-CM | POA: Diagnosis not present

## 2023-04-16 DIAGNOSIS — M79605 Pain in left leg: Secondary | ICD-10-CM

## 2023-04-16 DIAGNOSIS — M5416 Radiculopathy, lumbar region: Secondary | ICD-10-CM | POA: Diagnosis not present

## 2023-04-16 LAB — ECHOCARDIOGRAM COMPLETE
Area-P 1/2: 3.47 cm2
S' Lateral: 3.2 cm

## 2023-04-21 ENCOUNTER — Encounter: Payer: Self-pay | Admitting: *Deleted

## 2023-05-04 ENCOUNTER — Ambulatory Visit: Payer: Medicaid Other | Attending: Cardiology

## 2023-05-04 DIAGNOSIS — R400 Somnolence: Secondary | ICD-10-CM

## 2023-05-06 ENCOUNTER — Telehealth: Payer: Self-pay

## 2023-05-06 ENCOUNTER — Ambulatory Visit: Payer: Medicaid Other

## 2023-05-06 DIAGNOSIS — R400 Somnolence: Secondary | ICD-10-CM

## 2023-05-06 DIAGNOSIS — R0602 Shortness of breath: Secondary | ICD-10-CM

## 2023-05-06 NOTE — Telephone Encounter (Signed)
-----   Message from Quintella Reichert, MD sent at 05/04/2023 12:26 PM EDT ----- Nondiagnostic sleep study due to very low PAT tracing - needs to have study repeated and make sure she understands that her finger needs to be secure in the device

## 2023-05-06 NOTE — Telephone Encounter (Signed)
Called and left detailed msg per DPR letting patient know that sleep study needs to be repeated as not enough data was collected. Asked patient to return call.

## 2023-05-07 NOTE — Telephone Encounter (Signed)
The patient has been notified of the result. Left detailed message on voicemail and informed patient to call back..Candice Kane, CMA   

## 2023-05-08 NOTE — Telephone Encounter (Signed)
Return Call: The patient has been notified of the result and verbalized understanding.  All questions (if any) were answered. Latrelle Dodrill, CMA 05/08/2023 2:17 PM    Patient is agreeable to retest

## 2023-05-10 NOTE — Telephone Encounter (Signed)
Can we place new order?

## 2023-05-11 NOTE — Addendum Note (Signed)
Addended by: Neoma Laming on: 05/11/2023 12:50 PM   Modules accepted: Orders

## 2023-05-11 NOTE — Telephone Encounter (Signed)
[place] sleep study order placed.

## 2023-05-12 ENCOUNTER — Ambulatory Visit: Payer: Medicaid Other | Admitting: Physical Therapy

## 2023-05-13 NOTE — Telephone Encounter (Signed)
Prior Authorization for [facility] sent to MEDICAID via web portal. Tracking Number .  READY NO PA REQ

## 2023-05-15 ENCOUNTER — Telehealth: Payer: Self-pay

## 2023-05-19 ENCOUNTER — Ambulatory Visit: Payer: Medicaid Other | Admitting: Physical Therapy

## 2023-05-26 ENCOUNTER — Ambulatory Visit (INDEPENDENT_AMBULATORY_CARE_PROVIDER_SITE_OTHER): Payer: Medicaid Other

## 2023-05-26 ENCOUNTER — Ambulatory Visit: Payer: Medicaid Other | Admitting: Physical Therapy

## 2023-05-26 DIAGNOSIS — Z111 Encounter for screening for respiratory tuberculosis: Secondary | ICD-10-CM | POA: Diagnosis not present

## 2023-05-26 NOTE — Progress Notes (Signed)
Subjective:    Candice Kane is a 37 y.o. female who presents to the Infectious Disease clinic for evaluation of positive PPD. PPD was placed on Left forearm. There history is No of exposure to TB. Current symptoms: none Patient denies Cough,diarrhea, fatigue, fever, night sweats , weight loss.

## 2023-05-28 ENCOUNTER — Ambulatory Visit: Payer: Medicaid Other

## 2023-05-28 NOTE — Progress Notes (Signed)
Pt came in today for PPD reading. It was administered on the Lt forearm on 05/26/2023 and it was negative.   Candice Kane

## 2023-07-07 ENCOUNTER — Encounter (INDEPENDENT_AMBULATORY_CARE_PROVIDER_SITE_OTHER): Payer: Medicaid Other | Admitting: Family Medicine

## 2023-08-04 ENCOUNTER — Encounter (INDEPENDENT_AMBULATORY_CARE_PROVIDER_SITE_OTHER): Payer: Medicaid Other | Admitting: Adult Health

## 2023-08-07 ENCOUNTER — Other Ambulatory Visit (HOSPITAL_COMMUNITY)
Admission: RE | Admit: 2023-08-07 | Discharge: 2023-08-07 | Disposition: A | Discharge status: Home / Self Care | Payer: Medicaid Other | Source: Ambulatory Visit | Attending: Physician Assistant | Admitting: Physician Assistant

## 2023-08-07 ENCOUNTER — Encounter: Payer: Self-pay | Admitting: Physician Assistant

## 2023-08-07 ENCOUNTER — Ambulatory Visit (INDEPENDENT_AMBULATORY_CARE_PROVIDER_SITE_OTHER): Payer: Medicaid Other | Admitting: Physician Assistant

## 2023-08-07 VITALS — BP 120/84 | HR 92 | Temp 97.3°F | Ht 73.0 in | Wt 325.2 lb

## 2023-08-07 DIAGNOSIS — N898 Other specified noninflammatory disorders of vagina: Secondary | ICD-10-CM | POA: Diagnosis present

## 2023-08-07 NOTE — Progress Notes (Signed)
Candice Kane is a 38 y.o. female here for a new problem.  History of Present Illness:   Chief Complaint  Patient presents with   Vaginal Discharge    Pt c/o clear vaginal discharge started last week, was itchingthat has subsided, but has odor. She used Dial anti-bacteria soap.    HPI  Vaginal Discharge Patient is complaining of frequent clear vaginal discharge occurring since last week. She states that she was at her mothers' house and used dial anti-bacteria soap when she began itching the next day. The itching is now subdued, but she mentions that there is an odor. Denies blood in discharge, pelvic pain, and suspicion of an UTI.   Past Medical History:  Diagnosis Date   Alpha thalassemia silent carrier 06/15/2022   Anemia    History of chlamydia 12/01/2009   Sciatica of right side    Vaginal delivery    2012, 2023   Varicose veins      Social History   Tobacco Use   Smoking status: Former    Types: Cigars   Smokeless tobacco: Never  Vaping Use   Vaping status: Never Used  Substance Use Topics   Alcohol use: Yes    Comment: Occasionally   Drug use: Not Currently    Types: Marijuana    Past Surgical History:  Procedure Laterality Date   ADENOIDECTOMY     BACK SURGERY  08/14/2021   TONSILLECTOMY      Family History  Problem Relation Age of Onset   Cancer Mother        breast   Kidney failure Mother    Stroke Mother    Diabetes Father    Hyperlipidemia Father    Hypertension Father    Colon cancer Maternal Grandmother        colon   COPD Maternal Grandfather     No Known Allergies  Current Medications:   Current Outpatient Medications:    ibuprofen (ADVIL) 600 MG tablet, Take 1 tablet (600 mg total) by mouth every 6 (six) hours., Disp: 30 tablet, Rfl: 0   norethindrone (MICRONOR) 0.35 MG tablet, Take 1 tablet (0.35 mg total) by mouth daily., Disp: 84 tablet, Rfl: 1   predniSONE (STERAPRED UNI-PAK 21 TAB) 10 MG (21) TBPK tablet, Take 10 mg by mouth  as directed., Disp: , Rfl:    Review of Systems:   ROS  Vitals:   Vitals:   08/07/23 1309  BP: 120/84  Pulse: 92  Temp: (!) 97.3 F (36.3 C)  TempSrc: Temporal  SpO2: 98%  Weight: (!) 325 lb 4 oz (147.5 kg)  Height: 6\' 1"  (1.854 m)     Body mass index is 42.91 kg/m.  Physical Exam:   Physical Exam Constitutional:      General: She is not in acute distress.    Appearance: Normal appearance. She is not ill-appearing.  HENT:     Head: Normocephalic and atraumatic.     Right Ear: External ear normal.     Left Ear: External ear normal.  Eyes:     Extraocular Movements: Extraocular movements intact.     Pupils: Pupils are equal, round, and reactive to light.  Cardiovascular:     Rate and Rhythm: Normal rate and regular rhythm.     Heart sounds: Normal heart sounds. No murmur heard.    No gallop.  Pulmonary:     Effort: Pulmonary effort is normal. No respiratory distress.     Breath sounds: Normal breath sounds. No wheezing or  rales.  Skin:    General: Skin is warm and dry.  Neurological:     Mental Status: She is alert and oriented to person, place, and time.  Psychiatric:        Judgment: Judgment normal.     Assessment and Plan:   Vaginal discharge No red flags Patient obtained self-swab Recommend avoidance of antibacterial soaps Will add treatment based on results Follow-up as needed    I,Verona Buck,acting as a scribe for [employer], PA.,have documented all relevant documentation on the behalf of Jarold Motto, PA,as directed by  Jarold Motto, PA while in the presence of Jarold Motto, [place].  I, Jarold Motto, [place], have reviewed all documentation for this visit. The documentation on 08/07/23 for the exam, diagnosis, procedures, and orders are all accurate and complete.  Jarold Motto, PA-C

## 2023-08-10 ENCOUNTER — Other Ambulatory Visit: Payer: Self-pay | Admitting: Physician Assistant

## 2023-08-10 LAB — CERVICOVAGINAL ANCILLARY ONLY
Bacterial Vaginitis (gardnerella): POSITIVE — AB
Candida Glabrata: NEGATIVE
Candida Vaginitis: NEGATIVE
Chlamydia: NEGATIVE
Comment: NEGATIVE
Comment: NEGATIVE
Comment: NEGATIVE
Comment: NEGATIVE
Comment: NEGATIVE
Comment: NORMAL
Neisseria Gonorrhea: NEGATIVE
Trichomonas: POSITIVE — AB

## 2023-08-10 MED ORDER — METRONIDAZOLE 500 MG PO TABS: 500.0000 mg | ORAL_TABLET | Freq: Two times a day (BID) | ORAL | 0 refills | Status: AC

## 2023-08-11 ENCOUNTER — Telehealth: Payer: Self-pay | Admitting: Physician Assistant

## 2023-08-11 NOTE — Telephone Encounter (Signed)
See result notes. 

## 2023-08-11 NOTE — Telephone Encounter (Signed)
Patient returned call. Requests to be called. 

## 2023-08-17 ENCOUNTER — Encounter: Payer: Self-pay | Admitting: Family Medicine

## 2023-08-17 ENCOUNTER — Ambulatory Visit (INDEPENDENT_AMBULATORY_CARE_PROVIDER_SITE_OTHER): Payer: Medicaid Other | Admitting: Family Medicine

## 2023-08-17 VITALS — BP 136/80 | HR 96 | Temp 98.7°F | Ht 72.0 in | Wt 326.0 lb

## 2023-08-17 DIAGNOSIS — R632 Polyphagia: Secondary | ICD-10-CM | POA: Diagnosis not present

## 2023-08-17 DIAGNOSIS — I83893 Varicose 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 complications: Secondary | ICD-10-CM

## 2023-08-17 DIAGNOSIS — Z6841 Body Mass Index (BMI) 40.0 and over, adult: Secondary | ICD-10-CM

## 2023-08-17 NOTE — Progress Notes (Signed)
Office: [phone]  /  Fax: [phone]   Initial Visit  Candice Kane was seen in clinic today to evaluate for obesity. She is interested in losing weight to improve overall health and reduce the risk of weight related complications. She presents today to review program treatment options, initial physical assessment, and evaluation.     She was referred by: Specialist  When asked what else they would like to accomplish? She states: Adopt healthier eating patterns  Weight history:  weight stayed around 295 lb prior to baby.,  baby is 14 mos- done breastfeeding.  Has 63 yo son.  Mom is supportive.  She would like to be 280 lb. Teaches Kindergarten  When asked how has your weight affected you? She states: Other: none varicose veins have worsened  Some associated conditions: Other: hx of GDM  Contributing factors: Reduced physical activity  Weight promoting medications identified: None  Current nutrition plan: None  Current level of physical activity: None  Current or previous pharmacotherapy: None  Response to medication: Never tried medications   Past medical history includes:   Past Medical History:  Diagnosis Date   Alpha thalassemia silent carrier 06/15/2022   Anemia    History of chlamydia 12/01/2009   Sciatica of right side    Vaginal delivery    2012, 2023   Varicose veins      Objective:   BP 136/80   Pulse 96   Temp 98.7 F (37.1 C)   Ht 6' (1.829 m)   Wt (!) 326 lb (147.9 kg)   LMP 07/21/2023 (Exact Date)   SpO2 98%   BMI 44.21 kg/m  She was weighed on the bioimpedance scale: Body mass index is 44.21 kg/m.  Peak Weight:326 , Body Fat%:52, Visceral Fat Rating:15, Weight trend over the last 12 months: Unchanged  General:  Alert, oriented and cooperative. Patient is in no acute distress.  Respiratory: Normal respiratory effort, no problems with respiration noted   Gait: able to ambulate independently  Mental Status: Normal mood and affect. Normal  behavior. Normal judgment and thought content.   DIAGNOSTIC DATA REVIEWED:  BMET    Component Value Date/Time   NA 137 02/10/2023 1055   NA 136 05/16/2022 1104   K 3.8 02/10/2023 1055   CL 100 02/10/2023 1055   CO2 29 02/10/2023 1055   GLUCOSE 95 02/10/2023 1055   BUN 9 02/10/2023 1055   BUN 4 (L) 05/16/2022 1104   CREATININE 0.70 02/10/2023 1055   CALCIUM 8.9 02/10/2023 1055   GFRNONAA >60 06/15/2022 0459   Lab Results  Component Value Date   HGBA1C 6.6 (H) 12/17/2021   No results found for: "INSULIN" CBC    Component Value Date/Time   WBC 6.8 02/10/2023 1055   RBC 4.14 02/10/2023 1055   HGB 10.6 (L) 02/10/2023 1055   HGB 9.8 (L) 05/16/2022 1104   HCT 33.0 (L) 02/10/2023 1055   HCT 30.6 (L) 05/16/2022 1104   PLT 520.0 (H) 02/10/2023 1055   PLT 448 05/16/2022 1104   MCV 79.8 02/10/2023 1055   MCV 82 05/16/2022 1104   MCH 26.1 06/15/2022 1631   MCHC 32.0 02/10/2023 1055   RDW 16.6 (H) 02/10/2023 1055   RDW 15.2 05/16/2022 1104   Iron/TIBC/Ferritin/ %Sat No results found for: "IRON", "TIBC", "FERRITIN", "IRONPCTSAT" Lipid Panel     Component Value Date/Time   CHOL 193 01/10/2016 1642   TRIG 116.0 01/10/2016 1642   HDL 39.20 01/10/2016 1642   CHOLHDL 5 01/10/2016 1642  VLDL 23.2 01/10/2016 1642   LDLCALC 130 (H) 01/10/2016 1642   Hepatic Function Panel     Component Value Date/Time   PROT 6.8 02/10/2023 1055   PROT 6.2 05/16/2022 1104   ALBUMIN 3.7 02/10/2023 1055   ALBUMIN 3.3 (L) 05/16/2022 1104   AST 10 02/10/2023 1055   ALT 9 02/10/2023 1055   ALKPHOS 87 02/10/2023 1055   BILITOT 0.3 02/10/2023 1055   BILITOT <0.2 05/16/2022 1104   BILIDIR 0.0 03/10/2013 1025      Component Value Date/Time   TSH 0.64 02/10/2023 1055     Assessment and Plan:   Varicose 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 complications Assessment & Plan: Managed by Dr Lenell Antu who referred patient here for weight reduction. Has seen worsening varicose veins with  leg edema since 2nd pregnancy which a weight gain >30 lb more than pre - baby weight.  Continue plan of care per Dr Lenell Antu and begin active plan for weight reduction   Obesity, Class III, BMI 40-49.9 (morbid obesity) (HCC)  BMI 40.0-44.9, adult (HCC)  Polyphagia Assessment & Plan: Pt has never used anti obesity medication for polyphagia.  Denies any big changes to appetite over time.  Will focus on eating schedule with intake of lean protein and fiber at mealtime, minimizing ultra processed foods that stimulate appetite.  Consider use of Wegovy.          Obesity Treatment / Action Plan:  Patient will work on garnering support from family and friends to begin weight loss journey. Will work on eliminating or reducing the presence of highly palatable, calorie dense foods in the home. Will complete provided nutritional and psychosocial assessment questionnaire before the next appointment. Will be scheduled for indirect calorimetry to determine resting energy expenditure in a fasting state.  This will allow [place] to create a reduced calorie, high-protein meal plan to promote loss of fat mass while preserving muscle mass. Will think about ideas on how to incorporate physical activity into their daily routine. Counseled on the health benefits of losing 5%-15% of total body weight. Was counseled on nutritional approaches to weight loss and benefits of reducing processed foods and consuming plant-based foods and high quality protein as part of nutritional weight management. Was counseled on pharmacotherapy and role as an adjunct in weight management.   Obesity Education Performed Today:  She was weighed on the bioimpedance scale and results were discussed and documented in the synopsis.  We discussed obesity as a disease and the importance of a more detailed evaluation of all the factors contributing to the disease.  We discussed the importance of long term lifestyle changes which include  nutrition, exercise and behavioral modifications as well as the importance of customizing this to her specific health and social needs.  We discussed the benefits of reaching a healthier weight to alleviate the symptoms of existing conditions and reduce the risks of the biomechanical, metabolic and psychological effects of obesity.  Maylin Kesselring appears to be in the action stage of change and states they are ready to start intensive lifestyle modifications and behavioral modifications.  30 minutes was spent today on this visit including the above counseling, pre-visit chart review, and post-visit documentation.  Reviewed by clinician on day of visit: allergies, medications, problem list, medical history, surgical history, family history, social history, and previous encounter notes pertinent to obesity diagnosis.    Seymour Bars, D.O. DABFM, DABOM [facility] [phone] [place], [place] [address] [phone]

## 2023-08-17 NOTE — Assessment & Plan Note (Signed)
Managed by Dr Lenell Antu who referred patient here for weight reduction. Has seen worsening varicose veins with leg edema since 2nd pregnancy which a weight gain >30 lb more than pre - baby weight.  Continue plan of care per Dr Lenell Antu and begin active plan for weight reduction

## 2023-08-17 NOTE — Assessment & Plan Note (Signed)
Pt has never used anti obesity medication for polyphagia.  Denies any big changes to appetite over time.  Will focus on eating schedule with intake of lean protein and fiber at mealtime, minimizing ultra processed foods that stimulate appetite.  Consider use of Wegovy.

## 2023-08-18 DIAGNOSIS — Z0289 Encounter for other administrative examinations: Secondary | ICD-10-CM

## 2023-08-20 ENCOUNTER — Encounter: Payer: Medicaid Other | Admitting: Family Medicine

## 2023-09-02 ENCOUNTER — Ambulatory Visit: Payer: Medicaid Other | Admitting: Family Medicine

## 2023-09-02 ENCOUNTER — Encounter: Payer: Self-pay | Admitting: Family Medicine

## 2023-09-16 ENCOUNTER — Ambulatory Visit: Payer: Medicaid Other | Admitting: Family Medicine

## 2023-10-01 ENCOUNTER — Ambulatory Visit (INDEPENDENT_AMBULATORY_CARE_PROVIDER_SITE_OTHER): Payer: Medicaid Other | Admitting: Family Medicine

## 2023-10-01 ENCOUNTER — Encounter (INDEPENDENT_AMBULATORY_CARE_PROVIDER_SITE_OTHER): Payer: Self-pay | Admitting: Family Medicine

## 2023-10-01 VITALS — BP 118/67 | HR 85 | Temp 98.1°F | Ht 73.0 in | Wt 326.0 lb

## 2023-10-01 DIAGNOSIS — D509 Iron deficiency anemia, unspecified: Secondary | ICD-10-CM

## 2023-10-01 DIAGNOSIS — E669 Obesity, unspecified: Secondary | ICD-10-CM

## 2023-10-01 DIAGNOSIS — E559 Vitamin D deficiency, unspecified: Secondary | ICD-10-CM

## 2023-10-01 DIAGNOSIS — E1165 Type 2 diabetes mellitus with hyperglycemia: Secondary | ICD-10-CM

## 2023-10-01 DIAGNOSIS — Z1331 Encounter for screening for depression: Secondary | ICD-10-CM

## 2023-10-01 DIAGNOSIS — R5383 Other fatigue: Secondary | ICD-10-CM

## 2023-10-01 DIAGNOSIS — R0602 Shortness of breath: Secondary | ICD-10-CM | POA: Diagnosis not present

## 2023-10-01 DIAGNOSIS — E785 Hyperlipidemia, unspecified: Secondary | ICD-10-CM

## 2023-10-01 DIAGNOSIS — Z6841 Body Mass Index (BMI) 40.0 and over, adult: Secondary | ICD-10-CM

## 2023-10-01 DIAGNOSIS — E7849 Other hyperlipidemia: Secondary | ICD-10-CM

## 2023-10-01 NOTE — Progress Notes (Deleted)
Dear ***,   Thank you for referring Candice Kane to our clinic. The following note includes my evaluation and treatment recommendations.  Chief Complaint:   OBESITY Candice Kane (MR# [id number]) is {a/an:23460} 37 y.o. female who presents for evaluation and treatment of obesity and related comorbidities. Current BMI is Body mass index is 43.01 kg/m. Candice Kane has been struggling with her weight for many years and has been unsuccessful in either losing weight, maintaining weight loss, or reaching her healthy weight goal.  Candice Kane is currently in the action stage of change and ready to dedicate time achieving and maintaining a healthier weight. Candice Kane is interested in becoming our patient and working on intensive lifestyle modifications including (but not limited to) diet and exercise for weight loss.  Candice Kane's habits were reviewed today and are as follows: {MWM WT HABITS:23461}.  Depression Screen Candice Kane's Food and Mood (modified PHQ-9) score was 11.     08/07/2023    1:13 PM  Depression screen PHQ 2/9  Decreased Interest 0  Down, Depressed, Hopeless 0  PHQ - 2 Score 0   Subjective:   There are no diagnoses linked to this encounter. Assessment/Plan:   There are no diagnoses linked to this encounter. Candice Kane {CHL AMB IS/IS GMW:[id number]} currently in the action stage of change and her goal is to {MWMwtloss#1:210800005}. I recommend Candice Kane begin the structured treatment plan as follows:  She has agreed to {MWMwtlossportion/plan2:23431}.  Exercise goals: {MWM EXERCISE RECS:23473}   Behavioral modification strategies: {MWMwtlossdietstrategies3:23432}.  She was informed of the importance of frequent follow-up visits to maximize her success with intensive lifestyle modifications for her multiple health conditions. She was informed we would discuss her lab results at her next visit unless there is a critical issue that needs to be addressed sooner. Candice Kane agreed to keep her next visit  at the agreed upon time to discuss these results.  Objective:   Blood pressure 118/67, pulse 85, temperature 98.1 F (36.7 C), height 6\' 1"  (1.854 m), weight (!) 326 lb (147.9 kg), SpO2 100%, not currently breastfeeding. Body mass index is 43.01 kg/m.  EKG: Normal sinus rhythm, rate ***.  Indirect Calorimeter completed today shows a VO2 of 280 and a REE of 1930  Her calculated basal metabolic rate is 4403 thus her basal metabolic rate is {DESC; BETTER/WORSE:18575} than expected.  General: Cooperative, alert, well developed, in no acute distress. HEENT: Conjunctivae and lids unremarkable. Cardiovascular: Regular rhythm.  Lungs: Normal work of breathing. Neurologic: No focal deficits.   Lab Results  Component Value Date   CREATININE 0.70 02/10/2023   BUN 9 02/10/2023   NA 137 02/10/2023   K 3.8 02/10/2023   CL 100 02/10/2023   CO2 29 02/10/2023   Lab Results  Component Value Date   ALT 9 02/10/2023   AST 10 02/10/2023   ALKPHOS 87 02/10/2023   BILITOT 0.3 02/10/2023   Lab Results  Component Value Date   HGBA1C 6.6 (H) 12/17/2021   No results found for: "INSULIN" Lab Results  Component Value Date   TSH 0.64 02/10/2023   Lab Results  Component Value Date   CHOL 193 01/10/2016   HDL 39.20 01/10/2016   LDLCALC 130 (H) 01/10/2016   TRIG 116.0 01/10/2016   CHOLHDL 5 01/10/2016   Lab Results  Component Value Date   WBC 6.8 02/10/2023   HGB 10.6 (L) 02/10/2023   HCT 33.0 (L) 02/10/2023   MCV 79.8 02/10/2023   PLT 520.0 (H) 02/10/2023   No results found  for: "IRON", "TIBC", "FERRITIN"  Attestation Statements:   Reviewed by clinician on day of visit: allergies, medications, problem list, medical history, surgical history, family history, social history, and previous encounter notes.  ***(delete if time-based billing not used)Time spent on visit including pre-visit chart review and post-visit charting and care was *** minutes.    I have reviewed the above  documentation for accuracy and completeness, and I agree with the above. - ***

## 2023-10-01 NOTE — Progress Notes (Signed)
 Dear Candice Kane,   Thank you for referring Candice Kane to our clinic. The following note includes my evaluation and treatment recommendations.  Chief Complaint:   OBESITY Candice Kane (MR# [id number]) is a 37 y.o. female who presents for evaluation and treatment of obesity and related comorbidities. Current BMI is Body mass index is 43.01 kg/m. Candice Kane has been struggling with her weight for many years and has been unsuccessful in either losing weight, maintaining weight loss, or reaching her healthy weight goal.  Candice Kane is currently in the action stage of change and ready to dedicate time achieving and maintaining a healthier weight. Candice Kane is interested in becoming our patient and working on intensive lifestyle modifications including (but not limited to) diet and exercise for weight loss.  She was referred by her PCP and Dr. Sheree. She has a history of diabetes and hyperlipidemia as well as obesity.  She is not planning for another other children at this time. She is contemplating salpingectomy in the future.  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 Lives at home with her sons Nicholaus and Hines (ages 15 and 1).  She says her kids are supportive of her, they all eat meals together and they may or may not be eating healthier with her. Her desired weight is 280lbs as this was the last weight she felt comfortable at. As tried to stay consistent with her physical activity.   Eats out 4-5 a week at [employer], [employer], [place] or [place] [employer] for dinner. She skips breakfast often due to time constraints.   Food Recall: No breakfast in am.  Lunch in the cafeteria chicken party wings (5), 1 roll (only ate a bite), 3 spoonfuls of store bought potato salad and Mountain Dew (felt satisfied).  No snacks between this and dinner. Handful of cheese curls.  [employer] for dinner- Mong[place] Beef with 1 cup rice and oatmeal cake  (felt full- ate the entire thing) and a wine cooler.     Depression Screen Rinda's Food and Mood (modified PHQ-9) score was 11.     08/07/2023    1:13 PM  Depression screen PHQ 2/9  Decreased Interest 0  Down, Depressed, Hopeless 0  PHQ - 2 Score 0   Subjective:   1. Other fatigue Chandni admits to daytime somnolence and admits to waking up still tired. Patient has a history of symptoms of daytime fatigue. Candice Kane generally gets 5 hours of sleep per night, and states that she has difficulty falling asleep. Snoring is not present. Apneic episodes are not present.    2. SOBOE (shortness of breath on exertion) Candice Kane notes increasing shortness of breath with exercising and seems to be worsening over time with weight gain. She notes getting out of breath sooner with activity than she used to. This has not gotten worse recently. Freddi denies shortness of breath at rest or orthopnea.   Assessment/Plan:   1. Other fatigue Candice Kane does feel that her weight is causing her energy to be lower than it should be. Fatigue may be related to obesity, depression or many other causes. Labs will be ordered, and in the meanwhile, Candice Kane will focus on self care including making healthy food choices, increasing physical activity and focusing on stress reduction.   2. SOBOE (shortness of breath on exertion) Candice Kane does feel that she gets out of breath more easily that she used to when she exercises. Candice Kane's  shortness of breath appears to be obesity related and exercise induced. She has agreed to work on weight loss and gradually increase exercise to treat her exercise induced shortness of breath. Will continue to monitor closely.   3. Depression screening Candice Kane had a positive depression screening. Depression is commonly associated with obesity and often results in emotional eating behaviors. We will monitor this closely and work on CBT to help improve the non-hunger eating patterns. Referral to  Psychology may be required if no improvement is seen as she continues in our clinic.   Problem List Items Addressed This Visit       Endocrine   Type 2 diabetes mellitus with hyperglycemia (HCC)   Relevant Orders   Comprehensive metabolic panel (Completed)   Hemoglobin A1c (Completed)   Insulin , random (Completed)     Other   HLD (hyperlipidemia)   Relevant Orders   Lipid Panel With LDL/HDL Ratio (Completed)   Vitamin D  deficiency   Relevant Orders   VITAMIN D  25 Hydroxy (Vit-D Deficiency, Fractures) (Completed)   Other Visit Diagnoses       Other fatigue    -  Primary   Relevant Orders   Thyroid  Panel With TSH (Completed)     SOBOE (shortness of breath on exertion)         Microcytic anemia       Relevant Orders   CBC w/Diff/Platelet (Completed)   Anemia panel (Completed)     Depression screening         BMI 40.0-44.9, adult (HCC)         Obesity with starting BMI of 43.1            4. BMI 40.0-44.9, adult [name])   Candice Kane is currently in the action stage of change and her goal is to continue with weight loss efforts. I recommend Candice Kane begin the structured treatment plan as follows:  She has agreed to the Category 3 Plan and keeping a food journal and adhering to recommended goals of 350-500 calories for lunch and 450-600 calories for dinner and 30 or more  grams of protein for lunch and 40 or more grams of protein for dinner.  Exercise goals: All adults should avoid inactivity. Some physical activity is better than none, and adults who participate in any amount of physical activity gain some health benefits.   Behavioral modification strategies: increasing lean protein intake, decreasing eating out, and no skipping meals.  She was informed of the importance of frequent follow-up visits to maximize her success with intensive lifestyle modifications for her multiple health conditions. She was informed we would discuss her lab results at her next visit unless there is  a critical issue that needs to be addressed sooner. Candice Kane agreed to keep her next visit at the agreed upon time to discuss these results.  Objective:   Blood pressure 118/67, pulse 85, temperature 98.1 F (36.7 C), height 6' 1 (1.854 m), weight (!) 326 lb (147.9 kg), SpO2 100%, not currently breastfeeding. Body mass index is 43.01 kg/m.  EKG: Normal sinus rhythm- reviewed from EKG done 02/10/23.  Indirect Calorimeter completed today shows a VO2 of 280 and a REE of 1930.  Her calculated basal metabolic rate is 7695 thus her basal metabolic rate is worse than expected.  General: Cooperative, alert, well developed, in no acute distress. HEENT: Conjunctivae and lids unremarkable. Cardiovascular: Regular rhythm.  Lungs: Normal work of breathing. Neurologic: No focal deficits.   Lab Results  Component Value Date  CREATININE 0.82 10/01/2023   BUN 11 10/01/2023   NA 140 10/01/2023   K 4.6 10/01/2023   CL 102 10/01/2023   CO2 25 10/01/2023   Lab Results  Component Value Date   ALT 8 10/01/2023   AST 11 10/01/2023   ALKPHOS 96 10/01/2023   BILITOT <0.2 10/01/2023   Lab Results  Component Value Date   HGBA1C 6.2 (H) 10/01/2023   HGBA1C 6.6 (H) 12/17/2021   Lab Results  Component Value Date   INSULIN  12.5 10/01/2023   Lab Results  Component Value Date   TSH 0.305 (L) 12/24/2023   Lab Results  Component Value Date   CHOL 209 (H) 10/01/2023   HDL 43 10/01/2023   LDLCALC 145 (H) 10/01/2023   TRIG 118 10/01/2023   CHOLHDL 5 01/10/2016   Lab Results  Component Value Date   WBC 6.9 10/01/2023   HGB 9.5 (L) 10/01/2023   HCT 32.1 (L) 10/01/2023   MCV 79 10/01/2023   PLT 498 (H) 10/01/2023   Lab Results  Component Value Date   IRON  46 10/01/2023   TIBC 332 10/01/2023   FERRITIN 15 10/01/2023    Attestation Statements:   Reviewed by clinician on day of visit: allergies, medications, problem list, medical history, surgical history, family history, social history,  and previous encounter notes.  This is the patient's first visit at [facility]. The patient's NEW PATIENT PACKET was reviewed at length. Included in the packet: current and past health history, medications, allergies, ROS, gynecologic history (women only), surgical history, family history, social history, weight history, weight loss surgery history (for those that have had weight loss surgery), nutritional evaluation, mood and food questionnaire, PHQ9, Epworth questionnaire, sleep habits questionnaire, patient life and health improvement goals questionnaire. These will all be scanned into the patient's chart under media.   During the visit, I independently reviewed the patient's EKG, bioimpedance scale results, and indirect calorimeter results. I used this information to tailor a meal plan for the patient that will help her to lose weight and will improve her obesity-related conditions going forward. I performed a medically necessary appropriate examination and/or evaluation. I discussed the assessment and treatment plan with the patient. The patient was provided an opportunity to ask questions and all were answered. The patient agreed with the plan and demonstrated an understanding of the instructions. Labs were ordered at this visit and will be reviewed at the next visit unless more critical results need to be addressed immediately. Clinical information was updated and documented in the EMR.    I have reviewed the above documentation for accuracy and completeness, and I agree with the above. - [name], MD

## 2023-10-02 LAB — CBC WITH DIFFERENTIAL/PLATELET
Basophils Absolute: 0.1 10*3/uL (ref 0.0–0.2)
Basos: 1 %
EOS (ABSOLUTE): 0.1 10*3/uL (ref 0.0–0.4)
Eos: 1 %
Hemoglobin: 9.5 g/dL — ABNORMAL LOW (ref 11.1–15.9)
Immature Grans (Abs): 0 10*3/uL (ref 0.0–0.1)
Immature Granulocytes: 0 %
Lymphocytes Absolute: 2.8 10*3/uL (ref 0.7–3.1)
Lymphs: 40 %
MCH: 23.4 pg — ABNORMAL LOW (ref 26.6–33.0)
MCHC: 29.6 g/dL — ABNORMAL LOW (ref 31.5–35.7)
MCV: 79 fL (ref 79–97)
Monocytes Absolute: 0.4 10*3/uL (ref 0.1–0.9)
Monocytes: 5 %
Neutrophils Absolute: 3.6 10*3/uL (ref 1.4–7.0)
Neutrophils: 53 %
Platelets: 498 10*3/uL — ABNORMAL HIGH (ref 150–450)
RBC: 4.06 x10E6/uL (ref 3.77–5.28)
RDW: 17.5 % — ABNORMAL HIGH (ref 11.7–15.4)
WBC: 6.9 10*3/uL (ref 3.4–10.8)

## 2023-10-02 LAB — THYROID PANEL WITH TSH
Free Thyroxine Index: 1.4 (ref 1.2–4.9)
T3 Uptake Ratio: 24 % (ref 24–39)
T4, Total: 6 ug/dL (ref 4.5–12.0)
TSH: 0.423 u[IU]/mL — ABNORMAL LOW (ref 0.450–4.500)

## 2023-10-02 LAB — COMPREHENSIVE METABOLIC PANEL
ALT: 8 [IU]/L (ref 0–32)
AST: 11 [IU]/L (ref 0–40)
Albumin: 4 g/dL (ref 3.9–4.9)
Alkaline Phosphatase: 96 [IU]/L (ref 44–121)
BUN/Creatinine Ratio: 13 (ref 9–23)
BUN: 11 mg/dL (ref 6–20)
Bilirubin Total: <0.2 mg/dL (ref 0.0–1.2)
CO2: 25 mmol/L (ref 20–29)
Calcium: 8.9 mg/dL (ref 8.7–10.2)
Chloride: 102 mmol/L (ref 96–106)
Creatinine, Ser: 0.82 mg/dL (ref 0.57–1.00)
Globulin, Total: 2.8 g/dL (ref 1.5–4.5)
Glucose: 93 mg/dL (ref 70–99)
Potassium: 4.6 mmol/L (ref 3.5–5.2)
Sodium: 140 mmol/L (ref 134–144)
Total Protein: 6.8 g/dL (ref 6.0–8.5)
eGFR: 94 mL/min/{1.73_m2} (ref >59)

## 2023-10-02 LAB — ANEMIA PANEL
Ferritin: 15 ng/mL (ref 15–150)
Folate, Hemolysate: 256 ng/mL
Folate, RBC: 798 ng/mL (ref >498)
Hematocrit: 32.1 % — ABNORMAL LOW (ref 34.0–46.6)
Iron Saturation: 14 % — ABNORMAL LOW (ref 15–55)
Iron: 46 ug/dL (ref 27–159)
Retic Ct Pct: 1.2 % (ref 0.6–2.6)
Total Iron Binding Capacity: 332 ug/dL (ref 250–450)
UIBC: 286 ug/dL (ref 131–425)
Vitamin B-12: 436 pg/mL (ref 232–1245)

## 2023-10-02 LAB — INSULIN, RANDOM: INSULIN: 12.5 u[IU]/mL (ref 2.6–24.9)

## 2023-10-02 LAB — LIPID PANEL WITH LDL/HDL RATIO
Cholesterol, Total: 209 mg/dL — ABNORMAL HIGH (ref 100–199)
HDL: 43 mg/dL (ref >39)
LDL Chol Calc (NIH): 145 mg/dL — ABNORMAL HIGH (ref 0–99)
LDL/HDL Ratio: 3.4 ratio — ABNORMAL HIGH (ref 0.0–3.2)
Triglycerides: 118 mg/dL (ref 0–149)
VLDL Cholesterol Cal: 21 mg/dL (ref 5–40)

## 2023-10-02 LAB — HEMOGLOBIN A1C
Est. average glucose Bld gHb Est-mCnc: 131 mg/dL
Hgb A1c MFr Bld: 6.2 % — ABNORMAL HIGH (ref 4.8–5.6)

## 2023-10-02 LAB — VITAMIN D 25 HYDROXY (VIT D DEFICIENCY, FRACTURES): Vit D, 25-Hydroxy: 12.8 ng/mL — ABNORMAL LOW (ref 30.0–100.0)

## 2023-10-14 ENCOUNTER — Ambulatory Visit: Payer: Medicaid Other | Admitting: Vascular Surgery

## 2023-10-15 ENCOUNTER — Encounter (INDEPENDENT_AMBULATORY_CARE_PROVIDER_SITE_OTHER): Payer: Self-pay | Admitting: Family Medicine

## 2023-10-15 ENCOUNTER — Ambulatory Visit (INDEPENDENT_AMBULATORY_CARE_PROVIDER_SITE_OTHER): Payer: Medicaid Other | Admitting: Family Medicine

## 2023-10-15 VITALS — BP 122/78 | HR 80 | Temp 98.0°F | Ht 73.0 in | Wt 321.0 lb

## 2023-10-15 DIAGNOSIS — D563 Thalassemia minor: Secondary | ICD-10-CM

## 2023-10-15 DIAGNOSIS — E669 Obesity, unspecified: Secondary | ICD-10-CM

## 2023-10-15 DIAGNOSIS — D649 Anemia, unspecified: Secondary | ICD-10-CM | POA: Insufficient documentation

## 2023-10-15 DIAGNOSIS — Z6841 Body Mass Index (BMI) 40.0 and over, adult: Secondary | ICD-10-CM

## 2023-10-15 DIAGNOSIS — E559 Vitamin D deficiency, unspecified: Secondary | ICD-10-CM | POA: Diagnosis not present

## 2023-10-15 DIAGNOSIS — R7989 Other specified abnormal findings of blood chemistry: Secondary | ICD-10-CM

## 2023-10-15 DIAGNOSIS — E785 Hyperlipidemia, unspecified: Secondary | ICD-10-CM | POA: Diagnosis not present

## 2023-10-15 DIAGNOSIS — E1165 Type 2 diabetes mellitus with hyperglycemia: Secondary | ICD-10-CM

## 2023-10-15 DIAGNOSIS — D509 Iron deficiency anemia, unspecified: Secondary | ICD-10-CM | POA: Insufficient documentation

## 2023-10-15 DIAGNOSIS — E1169 Type 2 diabetes mellitus with other specified complication: Secondary | ICD-10-CM | POA: Insufficient documentation

## 2023-10-15 DIAGNOSIS — E7800 Pure hypercholesterolemia, unspecified: Secondary | ICD-10-CM

## 2023-10-15 MED ORDER — VITAMIN D (ERGOCALCIFEROL) 1.25 MG (50000 UNIT) PO CAPS
50000.0000 [IU] | ORAL_CAPSULE | ORAL | 0 refills | Status: DC
Start: 2023-10-15 — End: 2023-11-17

## 2023-10-15 NOTE — Assessment & Plan Note (Signed)
 Discussed importance of vitamin d supplementation.  Vitamin d supplementation has been shown to decrease fatigue, decrease risk of progression to insulin resistance and then prediabetes, decreases risk of falling in older age and can even assist in decreasing depressive symptoms in PTSD.   Prescription for Vitamin D sent in.

## 2023-10-15 NOTE — Assessment & Plan Note (Signed)
Prior A1c done at 12w gestation was 6.6.  Recent A1c of 6.2. Pathophysiology of progression through insulin resistance to prediabetes and diabetes was discussed at length today.  Patient to continue to monitor and be in control of total intake of snack calories which may be simple carbohydrates but should be consumed only after the patient has taken in all the nutrition for the day.  Macronutrient identification, classification and daily intake ratios were discussed.  Plan to repeat labs in 3 months to monitor both hemoglobin A1c and insulin levels.  No medications at this time as patient is not having significant hunger or cravings that would make following meal plan more difficult. While pharmacologic treatment would be warranted given patient's history of diabetes given that she was not able to get all food in consistently will follow up at next appointment to discuss pharmacotherapy.

## 2023-10-15 NOTE — Assessment & Plan Note (Signed)

## 2023-10-15 NOTE — Assessment & Plan Note (Signed)
Patients Horizon test showing that she is a silent carrier of alpha thal.  The test results from 2023 were printed.  She was encouraged to discuss results with her son's father and follow up test her children to see if they are carriers.

## 2023-10-15 NOTE — Assessment & Plan Note (Signed)
Per patient's chart there is a history of goiter but her ultrasound in 2014 was normal with no nodules or swelling. Will repeat TSH in 6 weeks

## 2023-10-15 NOTE — Progress Notes (Signed)
SUBJECTIVE:  Chief Complaint: Obesity  Interim History: Patient feels the first week was more consistent with food and exercise and the second week has been less consistent. Her son has been home all week with [facility] and has wanted her to sit with him all week.  She feels like she did better at work following the plan because she can eat while she is walking around and at home she feels like she is eating constantly. Over the next few weeks she wants to increase her physical activity. For Thanksgiving holiday she is going to [employer] with her family- not anticipating leftovers or anything like that.   Candice Kane is here to discuss her progress with her obesity treatment plan. She is on the Category 3 Plan and states she is following her eating plan approximately 50 % of the time. She states she is walking 20-30 minutes 2 times for 1 week    OBJECTIVE: Visit Diagnoses: Problem List Items Addressed This Visit       Endocrine   Type 2 diabetes mellitus with hyperglycemia (HCC)    Prior A1c done at 12w gestation was 6.6.  Recent A1c of 6.2. Pathophysiology of progression through insulin resistance to prediabetes and diabetes was discussed at length today.  Patient to continue to monitor and be in control of total intake of snack calories which may be simple carbohydrates but should be consumed only after the patient has taken in all the nutrition for the day.  Macronutrient identification, classification and daily intake ratios were discussed.  Plan to repeat labs in 3 months to monitor both hemoglobin A1c and insulin levels.  No medications at this time as patient is not having significant hunger or cravings that would make following meal plan more difficult. While pharmacologic treatment would be warranted given patient's history of diabetes given that she was not able to get all food in consistently will follow up at next appointment to discuss pharmacotherapy.         Other    Alpha thalassemia silent carrier    Patients Horizon test showing that she is a silent carrier of alpha thal.  The test results from 2023 were printed.  She was encouraged to discuss results with her son's father and follow up test her children to see if they are carriers.      HLD (hyperlipidemia)    Cannot risk stratify as patient's age is below 83.  Her LDL goal is 70 given her history of diabetes.  Discussed importance of monitoring saturated fat intake and staying within snack calories today.  No medication at this time- will repeat labs in 3 months.       Vitamin D deficiency    Discussed importance of vitamin d supplementation.  Vitamin d supplementation has been shown to decrease fatigue, decrease risk of progression to insulin resistance and then prediabetes, decreases risk of falling in older age and can even assist in decreasing depressive symptoms in PTSD.   Prescription for Vitamin D sent in.        Relevant Medications   Vitamin D, Ergocalciferol, (DRISDOL) 1.25 MG (50000 UNIT) CAPS capsule   Abnormal TSH    Per patient's chart there is a history of goiter but her ultrasound in 2014 was normal with no nodules or swelling. Will repeat TSH in 6 weeks      Other Visit Diagnoses     BMI 40.0-44.9, adult (HCC)    -  Primary   Obesity  with starting BMI of 43.1           Vitals Temp: 98 F (36.7 C) BP: 122/78 Pulse Rate: 80 SpO2: 99 %   Anthropometric Measurements Height: 6\' 1"  (1.854 m) Weight: (!) 321 lb (145.6 kg) BMI (Calculated): 42.36 Weight at Last Visit: 326 lb Weight Lost Since Last Visit: 5 Weight Gained Since Last Visit: 0 Starting Weight: 326 lb Total Weight Loss (lbs): 5 lb (2.268 kg)   Body Composition  Body Fat %: 50.3 % Fat Mass (lbs): 161.8 lbs Muscle Mass (lbs): 151.8 lbs Total Body Water (lbs): 112 lbs Visceral Fat Rating : 14   Other Clinical Data Today's Visit #: 2 Starting Date: 10/01/23     ASSESSMENT AND  PLAN:  Diet: Candice Kane is currently in the action stage of change. As such, her goal is to continue with weight loss efforts. She has agreed to Category 3 Plan.  Exercise: Candice Kane has been instructed that some exercise is better than none for weight loss and overall health benefits.   Behavior Modification:  We discussed the following Behavioral Modification Strategies today: increasing lean protein intake, increasing vegetables, decreasing eating out, and meal planning and cooking strategies.  No follow-ups on file.Marland Kitchen She was informed of the importance of frequent follow up visits to maximize her success with intensive lifestyle modifications for her multiple health conditions.  Attestation Statements:   Reviewed by clinician on day of visit: allergies, medications, problem list, medical history, surgical history, family history, social history, and previous encounter notes.   Time spent on visit including pre-visit chart review and post-visit care and charting was 50 minutes.    Reuben Likes, MD

## 2023-10-15 NOTE — Assessment & Plan Note (Signed)
Consistently low H/H.

## 2023-10-15 NOTE — Assessment & Plan Note (Addendum)
Cannot risk stratify as patient's age is below 57.  Her LDL goal is 70 given her history of diabetes.  Discussed importance of monitoring saturated fat intake and staying within snack calories today.  No medication at this time- will repeat labs in 3 months.

## 2023-11-17 ENCOUNTER — Telehealth (INDEPENDENT_AMBULATORY_CARE_PROVIDER_SITE_OTHER): Payer: Medicaid Other | Admitting: Family Medicine

## 2023-11-17 ENCOUNTER — Encounter (INDEPENDENT_AMBULATORY_CARE_PROVIDER_SITE_OTHER): Payer: Self-pay | Admitting: Family Medicine

## 2023-11-17 VITALS — Ht 73.0 in

## 2023-11-17 DIAGNOSIS — E1165 Type 2 diabetes mellitus with hyperglycemia: Secondary | ICD-10-CM | POA: Diagnosis not present

## 2023-11-17 DIAGNOSIS — Z6841 Body Mass Index (BMI) 40.0 and over, adult: Secondary | ICD-10-CM

## 2023-11-17 DIAGNOSIS — E559 Vitamin D deficiency, unspecified: Secondary | ICD-10-CM

## 2023-11-17 DIAGNOSIS — E669 Obesity, unspecified: Secondary | ICD-10-CM

## 2023-11-17 MED ORDER — VITAMIN D (ERGOCALCIFEROL) 1.25 MG (50000 UNIT) PO CAPS
50000.0000 [IU] | ORAL_CAPSULE | ORAL | 0 refills | Status: DC
Start: 2023-11-17 — End: 2024-01-25

## 2023-11-17 NOTE — Assessment & Plan Note (Signed)
Patients most recent A1c improved to 6.2.  She is making lifestyle changes of mindful eating and has been trying to work in consistent activity when tolerated. Will need repeat labs in February.

## 2023-11-17 NOTE — Assessment & Plan Note (Signed)
 Discussed importance of vitamin d supplementation.  Vitamin d supplementation has been shown to decrease fatigue, decrease risk of progression to insulin resistance and then prediabetes, decreases risk of falling in older age and can even assist in decreasing depressive symptoms in PTSD.   Prescription for Vitamin D sent in.

## 2023-11-17 NOTE — Progress Notes (Signed)
TeleHealth Visit:  Due to the COVID-19 pandemic, this visit was completed with telemedicine (audio/video) technology to reduce patient and provider exposure as well as to preserve personal protective equipment.   Drisana has verbally consented to this TeleHealth visit. The patient is located at home, the provider is located at the [facility] office. The participants in this visit include the listed provider and patient.    Chief Complaint: OBESITY Zylpha is here to discuss her progress with her obesity treatment plan along with follow-up of her obesity related diagnoses. Jessye is on the Category 3 Plan and states she is following her eating plan approximately 50% of the time. Dalya states she is walking 20-30 minutes 5 times per week.  Today's visit was #: 3 Starting weight: 326 lb Starting date: 10/01/23  Interim History: Patient on Augmentin, inhaler, steroid shot and breathing treatment currently due to pneumonia.  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  She hasn't been on plan as much over the last week due to lack of appetite.  Exercise was consistent prior to the last week but then she has felt so poorly she has been mostly bed bound. Friday is the last day of school for her kids and then they will be home for 2 weeks.   Subjective:   There are no diagnoses linked to this encounter. Assessment/Plan:   Problem List Items Addressed This Visit       Endocrine   Type 2 diabetes mellitus with hyperglycemia (HCC) - Primary   Patients most recent A1c improved to 6.2.  She is making lifestyle changes of mindful eating and has been trying to work in consistent activity when tolerated. Will need repeat labs in February.        Other   Vitamin D deficiency   Discussed importance of vitamin d supplementation.  Vitamin d supplementation has been shown to decrease fatigue, decrease risk of progression to insulin resistance and then  prediabetes, decreases risk of falling in older age and can even assist in decreasing depressive symptoms in PTSD.   Prescription for Vitamin D sent in.        Relevant Medications   Vitamin D, Ergocalciferol, (DRISDOL) 1.25 MG (50000 UNIT) CAPS capsule    There are no diagnoses linked to this encounter. Xophia is currently in the action stage of change. As such, her goal is to continue with weight loss efforts. She has agreed to the Category 3 Plan.   Exercise goals: No exercise has been prescribed at this time.  Behavioral modification strategies: increasing lean protein intake, increasing vegetables, no skipping meals, better snacking choices, holiday eating strategies , and planning for success.  Caretha has agreed to follow-up with our clinic in 4 weeks. She was informed of the importance of frequent follow-up visits to maximize her success with intensive lifestyle modifications for her multiple health conditions.   Objective:   VITALS: Per patient if applicable, see vitals. GENERAL: Alert and in no acute distress. CARDIOPULMONARY: No increased WOB. Speaking in clear sentences.  PSYCH: Pleasant and cooperative. Speech normal rate and rhythm. Affect is appropriate. Insight and judgement are appropriate. Attention is focused, linear, and appropriate.  NEURO: Oriented as arrived to appointment on time with no prompting.   Lab Results  Component Value Date   CREATININE 0.82 10/01/2023   BUN 11 10/01/2023   NA 140 10/01/2023   K 4.6 10/01/2023   CL 102 10/01/2023   CO2  25 10/01/2023   Lab Results  Component Value Date   ALT 8 10/01/2023   AST 11 10/01/2023   ALKPHOS 96 10/01/2023   BILITOT <0.2 10/01/2023   Lab Results  Component Value Date   HGBA1C 6.2 (H) 10/01/2023   HGBA1C 6.6 (H) 12/17/2021   Lab Results  Component Value Date   INSULIN 12.5 10/01/2023   Lab Results  Component Value Date   TSH 0.423 (L) 10/01/2023   Lab Results  Component Value Date   CHOL  209 (H) 10/01/2023   HDL 43 10/01/2023   LDLCALC 145 (H) 10/01/2023   TRIG 118 10/01/2023   CHOLHDL 5 01/10/2016   Lab Results  Component Value Date   VD25OH 12.8 (L) 10/01/2023   Lab Results  Component Value Date   WBC 6.9 10/01/2023   HGB 9.5 (L) 10/01/2023   HCT 32.1 (L) 10/01/2023   MCV 79 10/01/2023   PLT 498 (H) 10/01/2023   Lab Results  Component Value Date   IRON 46 10/01/2023   TIBC 332 10/01/2023   FERRITIN 15 10/01/2023    Attestation Statements:   Reviewed by clinician on day of visit: allergies, medications, problem list, medical history, surgical history, family history, social history, and previous encounter notes.  Kristeen Miss, MD

## 2023-12-24 ENCOUNTER — Ambulatory Visit (INDEPENDENT_AMBULATORY_CARE_PROVIDER_SITE_OTHER): Payer: Medicaid Other | Admitting: Family Medicine

## 2023-12-24 VITALS — BP 102/67 | HR 90 | Temp 98.2°F | Ht 73.0 in | Wt 333.0 lb

## 2023-12-24 DIAGNOSIS — E1165 Type 2 diabetes mellitus with hyperglycemia: Secondary | ICD-10-CM | POA: Diagnosis not present

## 2023-12-24 DIAGNOSIS — E66813 Obesity, class 3: Secondary | ICD-10-CM | POA: Diagnosis not present

## 2023-12-24 DIAGNOSIS — Z6841 Body Mass Index (BMI) 40.0 and over, adult: Secondary | ICD-10-CM

## 2023-12-24 DIAGNOSIS — Z794 Long term (current) use of insulin: Secondary | ICD-10-CM

## 2023-12-24 DIAGNOSIS — Z7985 Long-term (current) use of injectable non-insulin antidiabetic drugs: Secondary | ICD-10-CM

## 2023-12-24 DIAGNOSIS — R7989 Other specified abnormal findings of blood chemistry: Secondary | ICD-10-CM

## 2023-12-24 MED ORDER — OZEMPIC (0.25 OR 0.5 MG/DOSE) 2 MG/3ML ~~LOC~~ SOPN
0.2500 mg | PEN_INJECTOR | SUBCUTANEOUS | 0 refills | Status: DC
Start: 2023-12-24 — End: 2024-01-25

## 2023-12-24 MED ORDER — TIRZEPATIDE 2.5 MG/0.5ML ~~LOC~~ SOAJ
2.5000 mg | SUBCUTANEOUS | 0 refills | Status: DC
Start: 2023-12-24 — End: 2023-12-24

## 2023-12-24 MED ORDER — BD PEN NEEDLE NANO 2ND GEN 32G X 4 MM MISC
1.0000 | Freq: Two times a day (BID) | 0 refills | Status: AC
Start: 2023-12-24 — End: ?

## 2023-12-24 NOTE — Progress Notes (Signed)
   SUBJECTIVE:  Chief Complaint: Obesity  Interim History: Patient had a relaxed holiday- stayed home with no illnesses to speak of.  Two coworkers of hers had influenza.  In December her baby had HFM. [employer] she has been ordering out more frequently.  She is trying to do vegetables and grilled chicken.  Using the air fryer to prepare her shrimp or chicken.  Wants to get more in control of her rice intake. She ordered 6oz sirloin from [employer] when she went out to eat. Has been baking more sweets over the holidays when she has been home.   Citlali is here to discuss her progress with her obesity treatment plan. She is on the Category 3 Plan and states she is following her eating plan approximately 75 % of the time. She states she is not exercising as much.   OBJECTIVE: Visit Diagnoses: Problem List Items Addressed This Visit       Endocrine   Type 2 diabetes mellitus with hyperglycemia (HCC) - Primary   On semaglutide weekly.  She needs additional pen needles and a refill of semaglutide.  She can't always eat all the food on the plan.      Relevant Medications   Semaglutide,0.25 or 0.5MG /DOS, (OZEMPIC, 0.25 OR 0.5 MG/DOSE,) 2 MG/3ML SOPN   Insulin Pen Needle (BD PEN NEEDLE NANO 2ND GEN) 32G X 4 MM MISC     Other   Obesity, Class III, BMI 40-49.9 (morbid obesity) (HCC)   Starting BMI of 43.1 now at 43.9.  She is on ozempic to help with food intake control.  She wants to start slowly implementing activity into her normal routine.        Relevant Medications   Semaglutide,0.25 or 0.5MG /DOS, (OZEMPIC, 0.25 OR 0.5 MG/DOSE,) 2 MG/3ML SOPN   Abnormal TSH   TSH ordered today.  Last TSH below normal range.  Discuss labs at next appointment.      Relevant Orders   TSH (Completed)   Other Visit Diagnoses       Obesity with starting BMI of 43.1       Relevant Medications   Semaglutide,0.25 or 0.5MG /DOS, (OZEMPIC, 0.25 OR 0.5 MG/DOSE,) 2 MG/3ML SOPN     BMI 40.0-44.9, adult (HCC)        Relevant Medications   Semaglutide,0.25 or 0.5MG /DOS, (OZEMPIC, 0.25 OR 0.5 MG/DOSE,) 2 MG/3ML SOPN       No data recorded  No data recorded  No data recorded  No data recorded    ASSESSMENT AND PLAN:  Diet: Eliah is currently in the action stage of change. As such, her goal is to continue with weight loss efforts. She has agreed to Category 3 Plan.  Exercise: Araseli has been instructed to work up to a goal of 150 minutes of combined cardio and strengthening exercise per week for weight loss and overall health benefits.   Behavior Modification:  We discussed the following Behavioral Modification Strategies today: increasing lean protein intake, increasing vegetables, increase H2O intake, and decreasing eating out.  No follow-ups on file.Marland Kitchen She was informed of the importance of frequent follow up visits to maximize her success with intensive lifestyle modifications for her multiple health conditions.  Attestation Statements:   Reviewed by clinician on day of visit: allergies, medications, problem list, medical history, surgical history, family history, social history, and previous encounter notes.     Reuben Likes, MD

## 2023-12-25 LAB — TSH: TSH: 0.305 u[IU]/mL — ABNORMAL LOW (ref 0.450–4.500)

## 2023-12-28 ENCOUNTER — Encounter (INDEPENDENT_AMBULATORY_CARE_PROVIDER_SITE_OTHER): Payer: Self-pay

## 2024-01-02 NOTE — Assessment & Plan Note (Signed)
TSH ordered today.  Last TSH below normal range.  Discuss labs at next appointment.

## 2024-01-02 NOTE — Assessment & Plan Note (Signed)
On semaglutide weekly.  She needs additional pen needles and a refill of semaglutide.  She can't always eat all the food on the plan.

## 2024-01-02 NOTE — Assessment & Plan Note (Signed)
Starting BMI of 43.1 now at 43.9.  She is on ozempic to help with food intake control.  She wants to start slowly implementing activity into her normal routine.

## 2024-01-14 ENCOUNTER — Encounter (INDEPENDENT_AMBULATORY_CARE_PROVIDER_SITE_OTHER): Payer: Self-pay | Admitting: Family Medicine

## 2024-01-14 ENCOUNTER — Ambulatory Visit (INDEPENDENT_AMBULATORY_CARE_PROVIDER_SITE_OTHER): Payer: Medicaid Other | Admitting: Family Medicine

## 2024-01-14 VITALS — BP 131/83 | HR 84 | Temp 98.4°F | Ht 73.0 in | Wt 338.0 lb

## 2024-01-14 DIAGNOSIS — Z7985 Long-term (current) use of injectable non-insulin antidiabetic drugs: Secondary | ICD-10-CM

## 2024-01-14 DIAGNOSIS — E669 Obesity, unspecified: Secondary | ICD-10-CM | POA: Diagnosis not present

## 2024-01-14 DIAGNOSIS — R7989 Other specified abnormal findings of blood chemistry: Secondary | ICD-10-CM

## 2024-01-14 DIAGNOSIS — Z6841 Body Mass Index (BMI) 40.0 and over, adult: Secondary | ICD-10-CM | POA: Diagnosis not present

## 2024-01-14 DIAGNOSIS — E1165 Type 2 diabetes mellitus with hyperglycemia: Secondary | ICD-10-CM | POA: Diagnosis not present

## 2024-01-14 NOTE — Assessment & Plan Note (Signed)
First dose was erroneous in terms of amount.  Second dose correct dose and given correctly.  Patient is planing to continue 0.25mg  weekly and will follow up after her 4th injection to discuss increasing dosage.

## 2024-01-14 NOTE — Assessment & Plan Note (Addendum)
 TSH has decreased from prior level.  Patient reports history of thyroid issues in her childhood.  No change in current treatment plan.

## 2024-01-14 NOTE — Progress Notes (Signed)
   SUBJECTIVE:  Chief Complaint: Obesity  Interim History: Patient has been trying to not get sick all day.  She is happy to be out of work and in a less infectious environment.  Over the last few weeks she started really strong and then spiral the last few days.  Her mom brought over [employer] and she ate more than she anticipated.  She also ate more at lunch today than she normally does. She started the ozempic about a week ago.  She had some nausea but isn't sure if it was associated with the ozempic.   Candice Kane is here to discuss her progress with her obesity treatment plan. She is on the Category 3 Plan and states she is following her eating plan approximately 85 % of the time. She states she is exercising 30 minutes 3 times per week.   OBJECTIVE: Visit Diagnoses: Problem List Items Addressed This Visit       Endocrine   Type 2 diabetes mellitus with hyperglycemia (HCC)   First dose was erroneous in terms of amount.  Second dose correct dose and given correctly.  Patient is planing to continue 0.25mg  weekly and will follow up after her 4th injection to discuss increasing dosage.        Other   Abnormal TSH - Primary   TSH has decreased from prior level.  Patient reports history of thyroid issues in her childhood.  No change in current treatment plan.      Other Visit Diagnoses       Obesity with starting BMI of 43.1         BMI 40.0-44.9, adult (HCC)             01/14/2024    3:00 PM 12/24/2023    9:00 AM 11/17/2023    2:00 PM  Vitals with BMI  Height 6\' 1"  6\' 1"  6\' 1"   Weight 338 lbs 333 lbs   BMI 44.6 43.94   Systolic 131 102   Diastolic 83 67   Pulse 84 90     No data recorded  No data recorded  No data recorded  No data recorded    ASSESSMENT AND PLAN:  Diet: Candice Kane is currently in the action stage of change. As such, her goal is to continue with weight loss efforts. She has agreed to Category 3 Plan.  Exercise: Candice Kane has been instructed  that some exercise is better than none for weight loss and overall health benefits.   Behavior Modification:  We discussed the following Behavioral Modification Strategies today: increasing lean protein intake, increasing vegetables, meal planning and cooking strategies, keeping healthy foods in the home, and avoiding temptations.  No follow-ups on file.Marland Kitchen She was informed of the importance of frequent follow up visits to maximize her success with intensive lifestyle modifications for her multiple health conditions.  Attestation Statements:   Reviewed by clinician on day of visit: allergies, medications, problem list, medical history, surgical history, family history, social history, and previous encounter notes.     Rudean Hitt, MD

## 2024-01-20 ENCOUNTER — Ambulatory Visit: Payer: Medicaid Other | Admitting: Vascular Surgery

## 2024-01-25 ENCOUNTER — Encounter (INDEPENDENT_AMBULATORY_CARE_PROVIDER_SITE_OTHER): Payer: Self-pay | Admitting: Family Medicine

## 2024-01-25 ENCOUNTER — Ambulatory Visit (INDEPENDENT_AMBULATORY_CARE_PROVIDER_SITE_OTHER): Payer: Medicaid Other | Admitting: Family Medicine

## 2024-01-25 DIAGNOSIS — Z6841 Body Mass Index (BMI) 40.0 and over, adult: Secondary | ICD-10-CM

## 2024-01-25 DIAGNOSIS — E1165 Type 2 diabetes mellitus with hyperglycemia: Secondary | ICD-10-CM

## 2024-01-25 DIAGNOSIS — Z7985 Long-term (current) use of injectable non-insulin antidiabetic drugs: Secondary | ICD-10-CM

## 2024-01-25 DIAGNOSIS — E66813 Obesity, class 3: Secondary | ICD-10-CM

## 2024-01-25 DIAGNOSIS — E559 Vitamin D deficiency, unspecified: Secondary | ICD-10-CM

## 2024-01-25 MED ORDER — OZEMPIC (0.25 OR 0.5 MG/DOSE) 2 MG/3ML ~~LOC~~ SOPN: 0.2500 mg | PEN_INJECTOR | SUBCUTANEOUS | 0 refills | Status: DC

## 2024-01-25 MED ORDER — VITAMIN D (ERGOCALCIFEROL) 1.25 MG (50000 UNIT) PO CAPS
50000.0000 [IU] | ORAL_CAPSULE | ORAL | 0 refills | Status: DC
Start: 2024-01-25 — End: 2024-02-11

## 2024-01-25 NOTE — Assessment & Plan Note (Signed)
 She is doing well on Ozempic.  She hasn't experienced any side effects like constipation, nausea or abdominal pain.  She feels good at this dose.  She feels good control.

## 2024-01-25 NOTE — Progress Notes (Signed)
 SUBJECTIVE:  Chief Complaint: Obesity  Interim History: Patient feels like she has had a better week since last appointment.  She asked her mother her childhood history and did not have thyroid issues. Since last appointment she has been eating more baked chicken breast and eating more vegetables.  She is getting tired of chicken and is going to lean into other meat proteins.  She thinks she is getting close to total amount of protein in.  She joined [employer] last week and has been making a consistent effort to go.  No upcoming events, activities or plans to be at home.   Candice Kane is here to discuss her progress with her obesity treatment plan. She is on the Category 3 Plan and states she is following her eating plan approximately 80 % of the time. She states she is exercising 60 minutes 2 times per week.   OBJECTIVE: Visit Diagnoses: Problem List Items Addressed This Visit       Endocrine   Type 2 diabetes mellitus with hyperglycemia (HCC)   She is doing well on Ozempic.  She hasn't experienced any side effects like constipation, nausea or abdominal pain.  She feels good at this dose.  She feels good control.       Relevant Medications   Semaglutide,0.25 or 0.5MG /DOS, (OZEMPIC, 0.25 OR 0.5 MG/DOSE,) 2 MG/3ML SOPN     Other   Obesity, Class III, BMI 40-49.9 (morbid obesity) (HCC)   Starting BMI of 43.1 now at 43.8.  She is on ozempic to help with food intake control.  Being more mindful of not allowing indulgent food into the house which is helping her overall intake.      Relevant Medications   Semaglutide,0.25 or 0.5MG /DOS, (OZEMPIC, 0.25 OR 0.5 MG/DOSE,) 2 MG/3ML SOPN   Vitamin D deficiency   Doing well on prescription strength Vitamin D.  Needs a refill today.      Relevant Medications   Vitamin D, Ergocalciferol, (DRISDOL) 1.25 MG (50000 UNIT) CAPS capsule   Other Visit Diagnoses       Obesity with starting BMI of 43.1    -  Primary   Relevant Medications    Semaglutide,0.25 or 0.5MG /DOS, (OZEMPIC, 0.25 OR 0.5 MG/DOSE,) 2 MG/3ML SOPN     BMI 40.0-44.9, adult (HCC)       Relevant Medications   Semaglutide,0.25 or 0.5MG /DOS, (OZEMPIC, 0.25 OR 0.5 MG/DOSE,) 2 MG/3ML SOPN         01/25/2024    2:00 PM 01/14/2024    3:00 PM 12/24/2023    9:00 AM  Vitals with BMI  Height 6\' 1"  6\' 1"  6\' 1"   Weight 332 lbs 338 lbs 333 lbs  BMI 43.81 44.6 43.94  Systolic 114 131 161  Diastolic 81 83 67  Pulse 72 84 90    No data recorded No data recorded No data recorded No data recorded   ASSESSMENT AND PLAN:  Diet: Elisea is currently in the action stage of change. As such, her goal is to continue with weight loss efforts and has agreed to the Category 3 Plan.   Exercise:  For substantial health benefits, adults should do at least 150 minutes (2 hours and 30 minutes) a week of moderate-intensity, or 75 minutes (1 hour and 15 minutes) a week of vigorous-intensity aerobic physical activity, or an equivalent combination of moderate- and vigorous-intensity aerobic activity. Aerobic activity should be performed in episodes of at least 10 minutes, and preferably, it should be spread throughout the week.  Behavior Modification:  We discussed the following Behavioral Modification Strategies today: increasing lean protein intake, increasing vegetables, meal planning and cooking strategies, and planning for success.   No follow-ups on file.Marland Kitchen She was informed of the importance of frequent follow up visits to maximize her success with intensive lifestyle modifications for her multiple health conditions.  Attestation Statements:   Reviewed by clinician on day of visit: allergies, medications, problem list, medical history, surgical history, family history, social history, and previous encounter notes.    Reuben Likes, MD

## 2024-02-01 NOTE — Assessment & Plan Note (Signed)
 Doing well on prescription strength Vitamin D.  Needs a refill today.

## 2024-02-01 NOTE — Assessment & Plan Note (Signed)
 Starting BMI of 43.1 now at 43.8.  She is on ozempic to help with food intake control.  Being more mindful of not allowing indulgent food into the house which is helping her overall intake.

## 2024-02-08 IMAGING — US US MFM OB DETAIL+14 WK
1 series · 12 of 28 positions shown · non-contrast
Comparison: none

[Series 1: us mfm ob detail+14 wk · 12 of 89 slices shown]
[im 4/89]
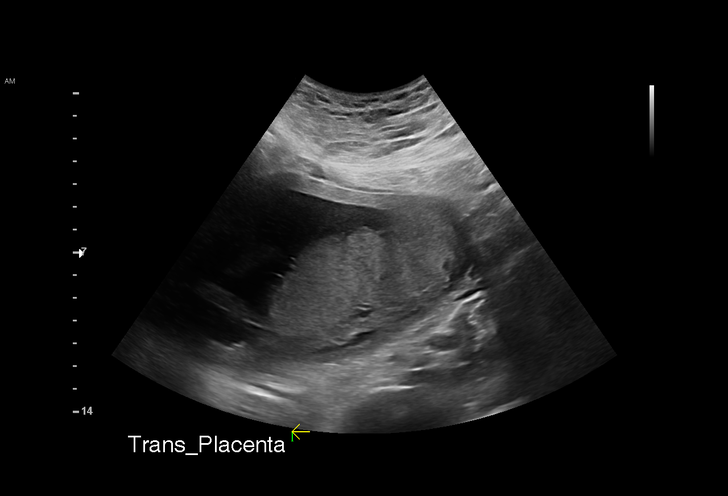
[im 10/89]
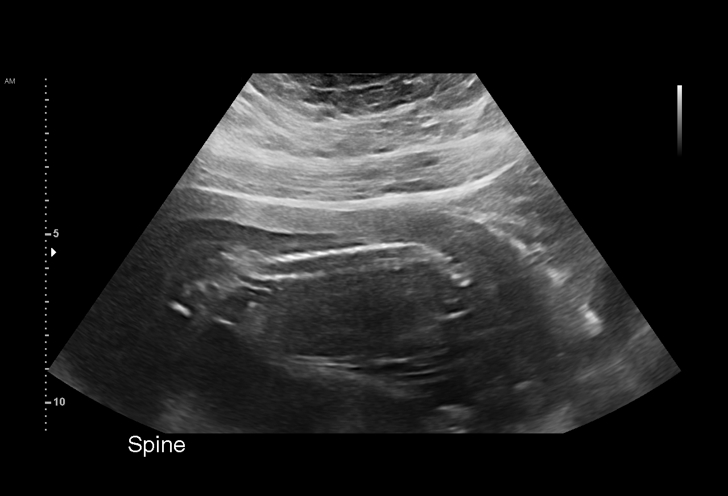
[im 17/89]
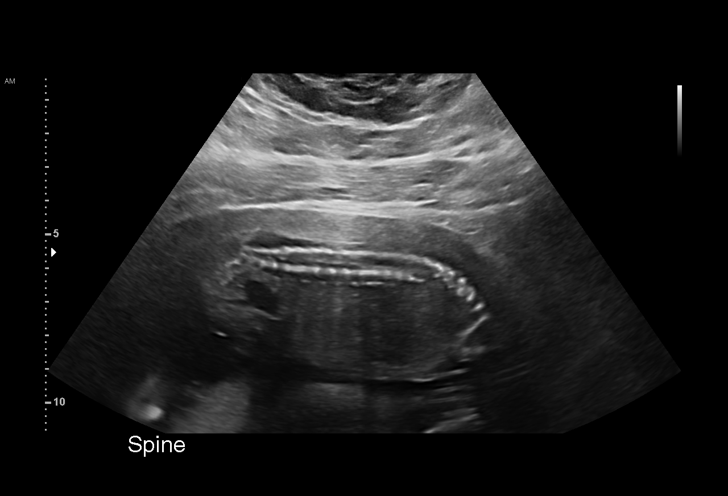
[im 27/89]
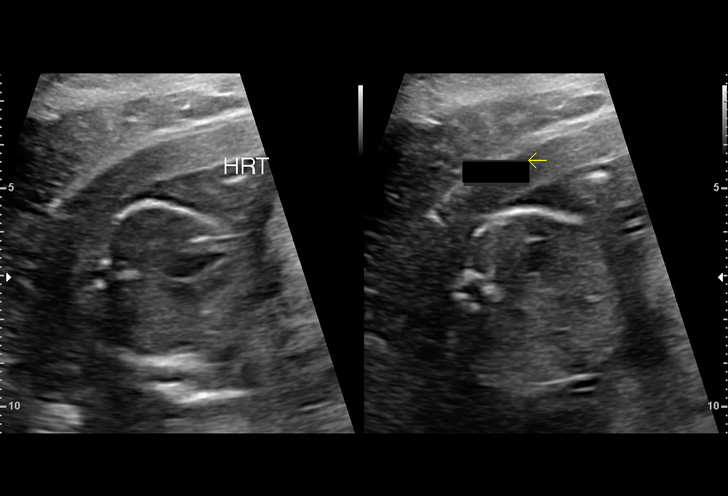
[im 33/89]
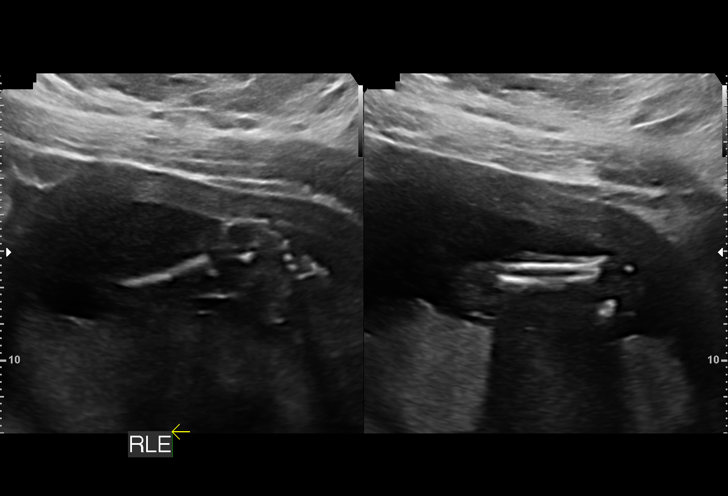
[im 40/89]
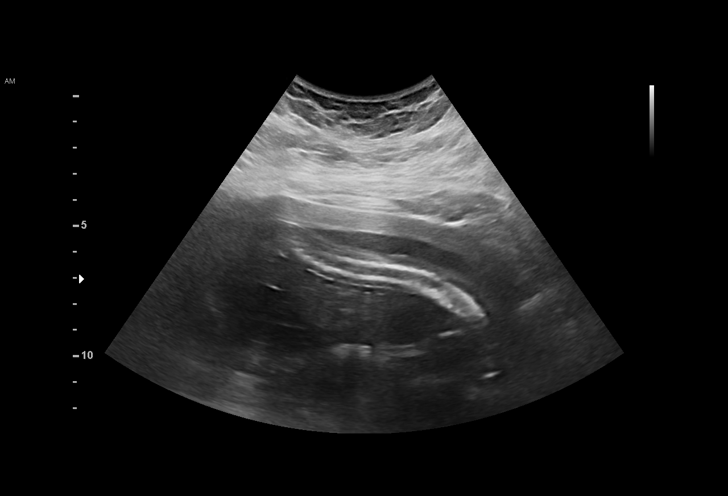
[im 49/89]
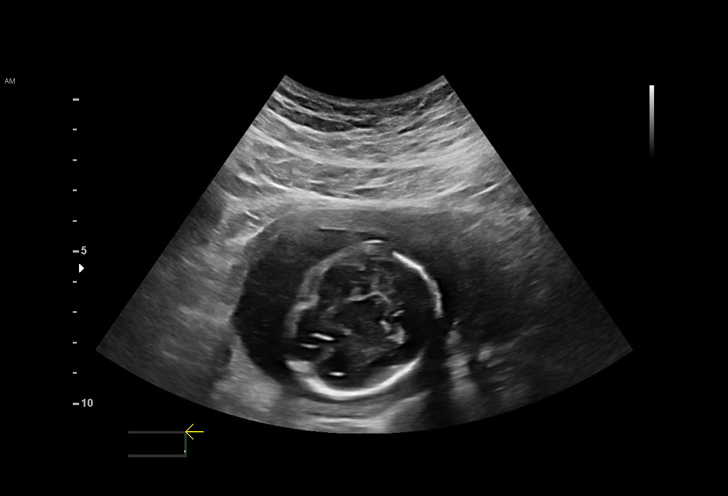
[im 56/89]
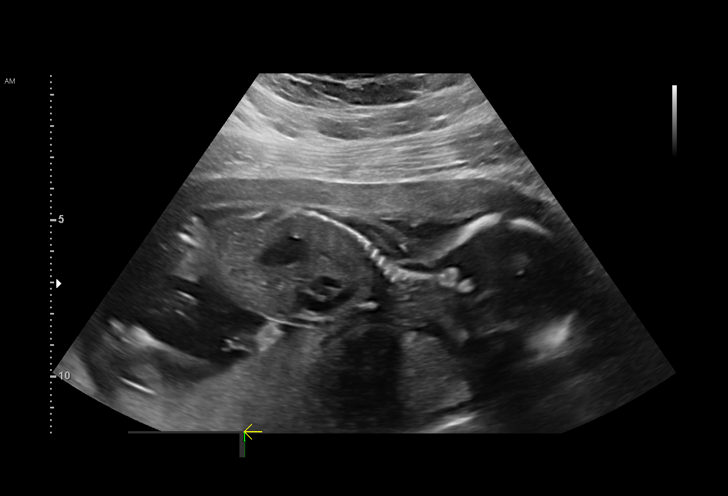
[im 62/89]
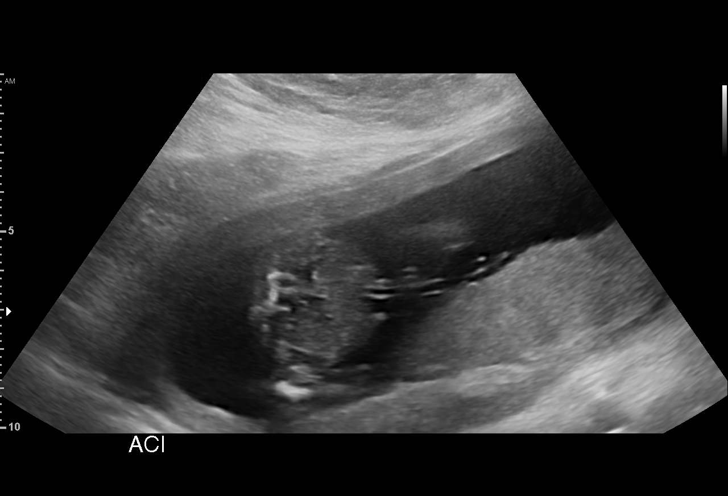
[im 72/89]
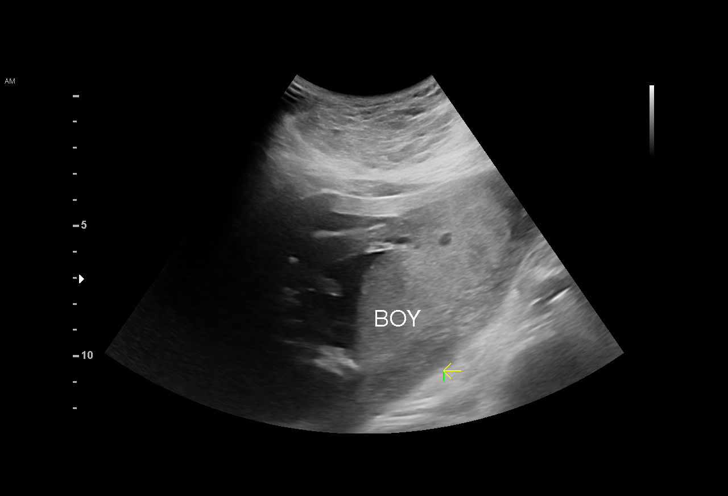
[im 79/89]
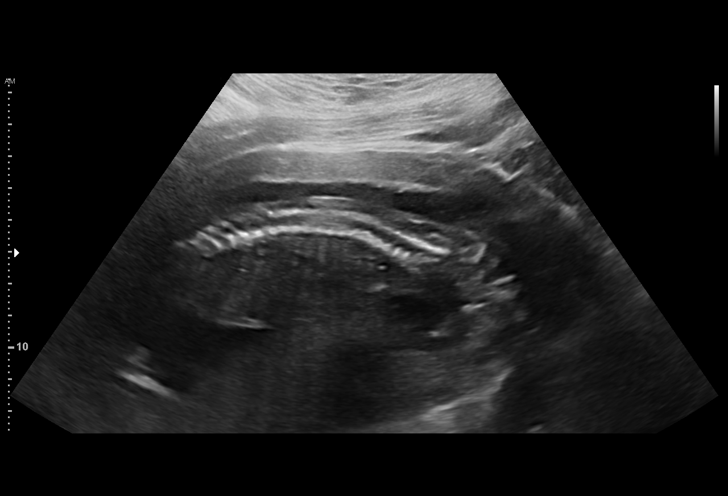
[im 85/89]
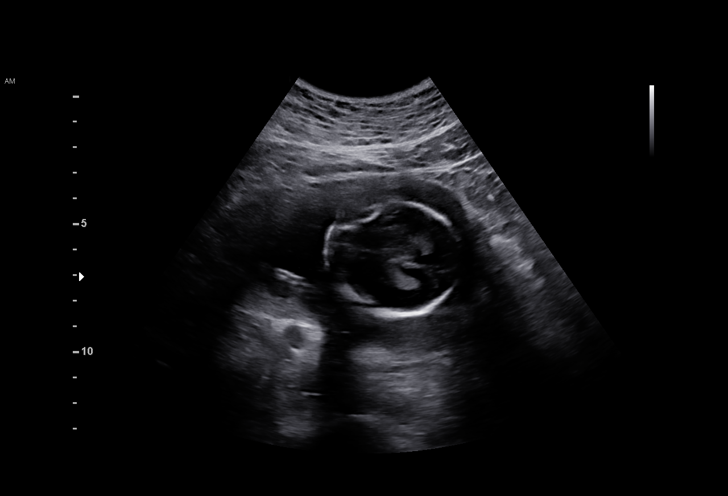

[12 of 28 positions shown; findings below may reference images not displayed]

Indications

 Gestational diabetes in pregnancy, diet
 controlled
 Obesity complicating pregnancy, second
 trimester
 18 weeks gestation of pregnancy
 Antenatal screening for malformations
 Advanced maternal age multigravida 35+,
 second trimester
 LR NIPS
Fetal Evaluation

 Num Of Fetuses:         1
 Fetal Heart Rate(bpm):  147
 Cardiac Activity:       Observed
 Presentation:           Cephalic
 Placenta:               Posterior
 P. Cord Insertion:      Marginal insertion

 Amniotic Fluid
 AFI FV:      Within normal limits
Biometry

 BPD:      43.2  mm     G. Age:  19w 0d         60  %    CI:        74.06   %    70 - 86
                                                         FL/HC:      18.6   %    16.1 -
 HC:      159.4  mm     G. Age:  18w 5d         39  %    HC/AC:      1.18        1.09 -
 AC:      135.2  mm     G. Age:  19w 0d         50  %    FL/BPD:     68.5   %
 FL:       29.6  mm     G. Age:  19w 1d         54  %    FL/AC:      21.9   %    20 - 24
 CER:      19.4  mm     G. Age:  18w 6d         39  %
 NFT:       2.8  mm

 CM:        5.8  mm

 Est. FW:     270  gm    0 lb 10 oz      56  %
Gestational Age

 U/S Today:     19w 0d                                        EDD:   06/30/22
 Best:          18w 6d     Det. By:  U/S C R L  (11/26/21)    EDD:   07/01/22
Anatomy

 Cranium:               Appears normal         Aortic Arch:            Appears normal
 Cavum:                 Appears normal         Ductal Arch:            Appears normal
 Ventricles:            Appears normal         Diaphragm:              Appears normal
 Choroid Plexus:        Appears normal         Stomach:                Appears normal, left
                                                                       sided
 Cerebellum:            Appears normal         Abdomen:                Appears normal
 Posterior Fossa:       Appears normal         Abdominal Wall:         Appears nml (cord
                                                                       insert, abd wall)
 Nuchal Fold:           Appears normal         Cord Vessels:           Appears normal (3
                                                                       vessel cord)
 Face:                  Not well visualized    Kidneys:                Appear normal
 Lips:                  Not well visualized    Bladder:                Appears normal
 Thoracic:              Appears normal         Spine:                  Appears normal
 Heart:                 Not well visualized    Upper Extremities:      Appears normal
 RVOT:                  Not well visualized    Lower Extremities:      Appears normal
 LVOT:                  Not well visualized

 Other:  Technically difficult due to maternal habitus and fetal position. Open
         hands/5th digits visualized. Fetus appears to be a male.
Cervix Uterus Adnexa

 Cervix
 Length:           3.46  cm.
 Normal appearance by transabdominal scan.

 Uterus
 No abnormality visualized.

 Right Ovary
 Within normal limits.

 Left Ovary
 Within normal limits.
Comments

 Muneiah Achari was seen for a detailed fetal anatomy scan
 due to maternal obesity and recently diagnosed diet-
 controlled gestational diabetes.  She has screened positive
 as a carrier for the alpha thalassemia trait.  She is already
 scheduled to meet with our genetic counselor in 2 days
 regarding this screening test.
 She has been monitoring her fingerstick values and reports
 that some of her fasting values are elevated.
 She had a cell free DNA test earlier in her pregnancy which
 indicated a low risk for trisomy 21, 18, and 13. A male fetus is
 predicted.
 She was informed that the fetal growth and amniotic fluid
 level were appropriate for her gestational age.
 The views of the fetal anatomy were limited today due to the
 fetal position and maternal body habitus.
 The patient was informed that anomalies may be missed due
 to technical limitations. If the fetus is in a suboptimal position
 or maternal habitus is increased, visualization of the fetus in
 the maternal uterus may be impaired.
 The following were discussed today:
 Gestational diabetes
 The implications and management of diabetes in pregnancy
 was discussed in detail with the patient.
 She was advised to continue to monitor her fingersticks 4
 times daily (fasting and 2 hours after each meal).
 She was advised that our goals for her fingerstick values are
 fasting values of 90-95 or less and two-hour postprandial
 values of 120 or less.
 Should the majority of her fingerstick results be above these
 values, she may have to be started on metformin or insulin to
 help her achieve better glycemic control. The patient was
 advised that getting her fingerstick values as close to these
 goals as possible would provide her with the most optimal
 obstetrical outcome.
 Due to gestational diabetes, we will continue to follow her
 with monthly growth ultrasounds.
 Weekly fetal testing will be recommended starting at 32
 weeks should she require insulin or metformin for treatment
 of gestational diabetes.
 The increased risk of polyhydramnios, fetal macrosomia, and
 preeclampsia associated with diabetes was also discussed.
 The patient was advised that delivery for well-controlled
 diabetes in pregnancy is usually recommended at around 39
 weeks.  Delivery at 37 weeks may be considered should her
 glycemic control be poor.
 The patient stated that all of her questions have been
 answered.
 We will continue to follow her closely with you throughout her
 pregnancy.
 A follow-up exam was scheduled in 4 weeks to complete the
 views of the fetal anatomy.
 We will consider a referral to pediatric cardiology for a fetal
 echocardiogram should the fetal cardiac views be unable to
 be visualized during her next exam.
 A total of 30 minutes was spent counseling and coordinating
 the care for this patient.  Greater than 50% of the time was
 spent in direct face-to-face contact.

## 2024-02-11 ENCOUNTER — Ambulatory Visit (INDEPENDENT_AMBULATORY_CARE_PROVIDER_SITE_OTHER): Payer: Medicaid Other | Admitting: Family Medicine

## 2024-02-11 ENCOUNTER — Encounter (INDEPENDENT_AMBULATORY_CARE_PROVIDER_SITE_OTHER): Payer: Self-pay | Admitting: Family Medicine

## 2024-02-11 VITALS — BP 118/82 | HR 70 | Temp 98.5°F | Ht 73.0 in | Wt 326.0 lb

## 2024-02-11 DIAGNOSIS — E1165 Type 2 diabetes mellitus with hyperglycemia: Secondary | ICD-10-CM | POA: Diagnosis not present

## 2024-02-11 DIAGNOSIS — E559 Vitamin D deficiency, unspecified: Secondary | ICD-10-CM

## 2024-02-11 DIAGNOSIS — E66813 Obesity, class 3: Secondary | ICD-10-CM | POA: Diagnosis not present

## 2024-02-11 DIAGNOSIS — Z7985 Long-term (current) use of injectable non-insulin antidiabetic drugs: Secondary | ICD-10-CM

## 2024-02-11 DIAGNOSIS — Z6841 Body Mass Index (BMI) 40.0 and over, adult: Secondary | ICD-10-CM | POA: Diagnosis not present

## 2024-02-11 MED ORDER — OZEMPIC (0.25 OR 0.5 MG/DOSE) 2 MG/3ML ~~LOC~~ SOPN: 0.2500 mg | PEN_INJECTOR | SUBCUTANEOUS | 0 refills | Status: DC

## 2024-02-11 MED ORDER — VITAMIN D (ERGOCALCIFEROL) 1.25 MG (50000 UNIT) PO CAPS: 50000.0000 [IU] | ORAL_CAPSULE | ORAL | 0 refills | Status: DC

## 2024-02-11 NOTE — Progress Notes (Signed)
 SUBJECTIVE:  Chief Complaint: Obesity  Interim History: She is doing exceptionally well on plan.  She has been eating 65% on  Category 3.  She is still struggling to get significant food in during the am- can eat fruit cups in the am but really the first time she is really eating is at lunch.  She hasn't indulged in any sweets or anything.    Esperanza is here to discuss her progress with her obesity treatment plan. She is on the Category 3 Plan and states she is following her eating plan approximately 65 % of the time. She states she is exercising 60 minutes 4-5 times per week.   OBJECTIVE: Visit Diagnoses: Problem List Items Addressed This Visit       Endocrine   Type 2 diabetes mellitus with hyperglycemia (HCC)   Doing well on ozempic.  Good control of food intake and she is making conscious choices to control macronutrient composition.  Needs refill of ozempic today       Relevant Medications   Semaglutide,0.25 or 0.5MG /DOS, (OZEMPIC, 0.25 OR 0.5 MG/DOSE,) 2 MG/3ML SOPN     Other   Obesity, Class III, BMI 40-49.9 (morbid obesity) (HCC)   Anthropometric Measurements Height: 6\' 1"  (1.854 m) Weight: (!) 326 lb (147.9 kg) BMI (Calculated): 43.02 Weight at Last Visit: 332 lb Weight Lost Since Last Visit: 6 lb Starting Weight: 326 lb Total Weight Loss (lbs): 6 lb (2.722 kg) Body Composition  Body Fat %: 50.1 % Fat Mass (lbs): 163.4 lbs Muscle Mass (lbs): 154.4 lbs Total Body Water (lbs): 113.2 lbs Visceral Fat Rating : 14 Other Clinical Data Today's Visit #: 7 Starting Date: 10/01/23 Comments: Cat 3       Relevant Medications   Semaglutide,0.25 or 0.5MG /DOS, (OZEMPIC, 0.25 OR 0.5 MG/DOSE,) 2 MG/3ML SOPN   Vitamin D deficiency - Primary   Needs refill of vitamin d today and will need repeat lab at next appointment prior to additional refills.      Relevant Medications   Vitamin D, Ergocalciferol, (DRISDOL) 1.25 MG (50000 UNIT) CAPS capsule   Other Visit Diagnoses        Obesity with starting BMI of 43.1       Relevant Medications   Semaglutide,0.25 or 0.5MG /DOS, (OZEMPIC, 0.25 OR 0.5 MG/DOSE,) 2 MG/3ML SOPN     BMI 40.0-44.9, adult (HCC)       Relevant Medications   Semaglutide,0.25 or 0.5MG /DOS, (OZEMPIC, 0.25 OR 0.5 MG/DOSE,) 2 MG/3ML SOPN       No data recorded     02/11/2024    3:00 PM 01/25/2024    2:00 PM 01/14/2024    3:00 PM  Vitals with BMI  Height 6\' 1"  6\' 1"  6\' 1"   Weight 326 lbs 332 lbs 338 lbs  BMI 43.02 43.81 44.6  Systolic 118 114 086  Diastolic 82 81 83  Pulse 70 72 84        ASSESSMENT AND PLAN:  Diet: Aubery is currently in the action stage of change. As such, her goal is to continue with weight loss efforts and has agreed to the Category 3 Plan.   Exercise:  For substantial health benefits, adults should do at least 150 minutes (2 hours and 30 minutes) a week of moderate-intensity, or 75 minutes (1 hour and 15 minutes) a week of vigorous-intensity aerobic physical activity, or an equivalent combination of moderate- and vigorous-intensity aerobic activity. Aerobic activity should be performed in episodes of at least 10 minutes, and preferably, it  should be spread throughout the week.  Behavior Modification:  We discussed the following Behavioral Modification Strategies today: increasing lean protein intake, meal planning and cooking strategies, keeping healthy foods in the home, avoiding temptations, and planning for success.   Return in about 4 weeks (around 03/10/2024) for fasting labs.. She was informed of the importance of frequent follow up visits to maximize her success with intensive lifestyle modifications for her multiple health conditions.  Attestation Statements:   Reviewed by clinician on day of visit: allergies, medications, problem list, medical history, surgical history, family history, social history, and previous encounter notes.    Reuben Likes, MD

## 2024-02-17 NOTE — Assessment & Plan Note (Signed)
 Doing well on ozempic.  Good control of food intake and she is making conscious choices to control macronutrient composition.  Needs refill of ozempic today

## 2024-02-17 NOTE — Assessment & Plan Note (Signed)
 Anthropometric Measurements Height: 6\' 1"  (1.854 m) Weight: (!) 326 lb (147.9 kg) BMI (Calculated): 43.02 Weight at Last Visit: 332 lb Weight Lost Since Last Visit: 6 lb Starting Weight: 326 lb Total Weight Loss (lbs): 6 lb (2.722 kg) Body Composition  Body Fat %: 50.1 % Fat Mass (lbs): 163.4 lbs Muscle Mass (lbs): 154.4 lbs Total Body Water (lbs): 113.2 lbs Visceral Fat Rating : 14 Other Clinical Data Today's Visit #: 7 Starting Date: 10/01/23 Comments: Cat 3

## 2024-02-17 NOTE — Assessment & Plan Note (Signed)
 Needs refill of vitamin d today and will need repeat lab at next appointment prior to additional refills.

## 2024-03-03 ENCOUNTER — Ambulatory Visit (INDEPENDENT_AMBULATORY_CARE_PROVIDER_SITE_OTHER): Payer: Medicaid Other | Admitting: Family Medicine

## 2024-03-03 ENCOUNTER — Encounter (INDEPENDENT_AMBULATORY_CARE_PROVIDER_SITE_OTHER): Payer: Self-pay | Admitting: Family Medicine

## 2024-03-03 VITALS — BP 101/64 | HR 108 | Temp 98.6°F | Ht 73.0 in | Wt 328.0 lb

## 2024-03-03 DIAGNOSIS — Z7985 Long-term (current) use of injectable non-insulin antidiabetic drugs: Secondary | ICD-10-CM

## 2024-03-03 DIAGNOSIS — E669 Obesity, unspecified: Secondary | ICD-10-CM | POA: Diagnosis not present

## 2024-03-03 DIAGNOSIS — E1165 Type 2 diabetes mellitus with hyperglycemia: Secondary | ICD-10-CM

## 2024-03-03 DIAGNOSIS — Z6841 Body Mass Index (BMI) 40.0 and over, adult: Secondary | ICD-10-CM

## 2024-03-03 DIAGNOSIS — R7989 Other specified abnormal findings of blood chemistry: Secondary | ICD-10-CM

## 2024-03-03 MED ORDER — OZEMPIC (0.25 OR 0.5 MG/DOSE) 2 MG/3ML ~~LOC~~ SOPN
0.5000 mg | PEN_INJECTOR | SUBCUTANEOUS | 0 refills | Status: DC
Start: 2024-03-03 — End: 2024-04-14

## 2024-03-03 NOTE — Assessment & Plan Note (Signed)
 Needs repeat TSH at next blood draw.  Patient is planning a follow up with her PCP in the next few weeks.  If labs not drawn will need labs at next appointment.

## 2024-03-03 NOTE — Progress Notes (Signed)
 SUBJECTIVE:  Chief Complaint: Obesity  Interim History: Since last appointment patient has been taking care of ill children frequently.  She hasn't really been able to follow her plan and hasn't been doing the same activity that she did previously.  She felt her hunger and appetite has come back significantly.  She mentions she isn't sure if her body is getting used to it or if she was experiencing confounding effects.   Laurelyn is here to discuss her progress with her obesity treatment plan. She is on the Category 3 Plan and states she is following her eating plan approximately 50 % of the time. She states she is not exercising.   OBJECTIVE: Visit Diagnoses: Problem List Items Addressed This Visit       Endocrine   Type 2 diabetes mellitus with hyperglycemia (HCC) - Primary   Patient on ozempic and noticing hunger is starting to come back.  She is only on 0.25mg  weekly of ozempic.  She needs a refill and we will increase to 0.5mg  weekly.  Will follow up at next appointment for hunger control and possible side effects.      Relevant Medications   Semaglutide,0.25 or 0.5MG /DOS, (OZEMPIC, 0.25 OR 0.5 MG/DOSE,) 2 MG/3ML SOPN     Other   Abnormal TSH   Needs repeat TSH at next blood draw.  Patient is planning a follow up with her PCP in the next few weeks.  If labs not drawn will need labs at next appointment.      Other Visit Diagnoses       Obesity with starting BMI of 43.1       Relevant Medications   Semaglutide,0.25 or 0.5MG /DOS, (OZEMPIC, 0.25 OR 0.5 MG/DOSE,) 2 MG/3ML SOPN     BMI 40.0-44.9, adult (HCC)       Relevant Medications   Semaglutide,0.25 or 0.5MG /DOS, (OZEMPIC, 0.25 OR 0.5 MG/DOSE,) 2 MG/3ML SOPN       Vitals Temp: 98.6 F (37 C) BP: 101/64 Pulse Rate: (!) 108 SpO2: 100 %   Anthropometric Measurements Height: 6\' 1"  (1.854 m) Weight: (!) 328 lb (148.8 kg) BMI (Calculated): 43.28 Weight at Last Visit: 326 lb Weight Lost Since Last Visit: 0 Weight  Gained Since Last Visit: 2 Starting Weight: 326 lb Total Weight Loss (lbs): 0 lb (0 kg)   Body Composition  Body Fat %: 48 % Fat Mass (lbs): 157.6 lbs Muscle Mass (lbs): 162.2 lbs Total Body Water (lbs): 119.6 lbs Visceral Fat Rating : 14   Other Clinical Data Today's Visit #: 8 Starting Date: 10/01/23 Comments: Cat 3     ASSESSMENT AND PLAN:  Diet: Chasady is currently in the action stage of change. As such, her goal is to continue with weight loss efforts and has agreed to the Category 3 Plan.   Exercise:  For substantial health benefits, adults should do at least 150 minutes (2 hours and 30 minutes) a week of moderate-intensity, or 75 minutes (1 hour and 15 minutes) a week of vigorous-intensity aerobic physical activity, or an equivalent combination of moderate- and vigorous-intensity aerobic activity. Aerobic activity should be performed in episodes of at least 10 minutes, and preferably, it should be spread throughout the week.  Behavior Modification:  We discussed the following Behavioral Modification Strategies today: increasing lean protein intake, decreasing simple carbohydrates, meal planning and cooking strategies, keeping healthy foods in the home, planning for success, and keep a strict food journal.   Return in about 3 weeks (around 03/24/2024).Marland Kitchen She was informed  of the importance of frequent follow up visits to maximize her success with intensive lifestyle modifications for her multiple health conditions.  Attestation Statements:   Reviewed by clinician on day of visit: allergies, medications, problem list, medical history, surgical history, family history, social history, and previous encounter notes.   Reuben Likes, MD

## 2024-03-03 NOTE — Assessment & Plan Note (Signed)
 Patient on ozempic and noticing hunger is starting to come back.  She is only on 0.25mg  weekly of ozempic.  She needs a refill and we will increase to 0.5mg  weekly.  Will follow up at next appointment for hunger control and possible side effects.

## 2024-03-24 ENCOUNTER — Ambulatory Visit (INDEPENDENT_AMBULATORY_CARE_PROVIDER_SITE_OTHER): Admitting: Family Medicine

## 2024-04-14 ENCOUNTER — Ambulatory Visit (INDEPENDENT_AMBULATORY_CARE_PROVIDER_SITE_OTHER): Admitting: Family Medicine

## 2024-04-14 ENCOUNTER — Encounter (INDEPENDENT_AMBULATORY_CARE_PROVIDER_SITE_OTHER): Payer: Self-pay | Admitting: Family Medicine

## 2024-04-14 VITALS — BP 116/75 | HR 77 | Temp 98.7°F | Ht 73.0 in | Wt 323.0 lb

## 2024-04-14 DIAGNOSIS — E66813 Obesity, class 3: Secondary | ICD-10-CM

## 2024-04-14 DIAGNOSIS — Z7985 Long-term (current) use of injectable non-insulin antidiabetic drugs: Secondary | ICD-10-CM

## 2024-04-14 DIAGNOSIS — R7989 Other specified abnormal findings of blood chemistry: Secondary | ICD-10-CM

## 2024-04-14 DIAGNOSIS — E559 Vitamin D deficiency, unspecified: Secondary | ICD-10-CM

## 2024-04-14 DIAGNOSIS — D509 Iron deficiency anemia, unspecified: Secondary | ICD-10-CM

## 2024-04-14 DIAGNOSIS — E1165 Type 2 diabetes mellitus with hyperglycemia: Secondary | ICD-10-CM

## 2024-04-14 DIAGNOSIS — E1169 Type 2 diabetes mellitus with other specified complication: Secondary | ICD-10-CM

## 2024-04-14 DIAGNOSIS — E785 Hyperlipidemia, unspecified: Secondary | ICD-10-CM

## 2024-04-14 DIAGNOSIS — Z6841 Body Mass Index (BMI) 40.0 and over, adult: Secondary | ICD-10-CM

## 2024-04-14 MED ORDER — VITAMIN D (ERGOCALCIFEROL) 1.25 MG (50000 UNIT) PO CAPS: 50000.0000 [IU] | ORAL_CAPSULE | ORAL | 0 refills | Status: DC

## 2024-04-14 MED ORDER — OZEMPIC (0.25 OR 0.5 MG/DOSE) 2 MG/3ML ~~LOC~~ SOPN: 0.5000 mg | PEN_INJECTOR | SUBCUTANEOUS | 0 refills | Status: DC

## 2024-04-14 NOTE — Progress Notes (Signed)
 SUBJECTIVE:  Chief Complaint: Obesity  Interim History: Candice Kane hasn't had her shot in the last two weeks due to a gi illness going around her house.  Candice Kane has a lot  going on now.  Candice Kane mentions that over the summer Candice Kane wants to get more prepping and planning strategies.  Candice Kane wants to get more protein in the house in preparation. Over the next four weeks Candice Kane can do breakfast more days than not with adequate nutrition.  Candice Kane also wants to increase her exercise time.   Candice Kane is here to discuss her progress with her obesity treatment plan. Candice Kane is not on any plan and states Candice Kane is not following her eating plan. Candice Kane states Candice Kane is not exercising.   OBJECTIVE: Visit Diagnoses: Problem List Items Addressed This Visit       Endocrine   Hyperlipidemia associated with type 2 diabetes mellitus (HCC)   Last LDL elevated- patient not on medications.Goal cholesterol will be less than 70 given diabetes.  HDL at time of prior labs was 43 with triglycerides of 118.  Will repeat cholesterol panel today and discuss labs at next appointment.       Relevant Medications   Semaglutide ,0.25 or 0.5MG /DOS, (OZEMPIC , 0.25 OR 0.5 MG/DOSE,) 2 MG/3ML SOPN   Other Relevant Orders   Lipid Panel With LDL/HDL Ratio   Type 2 diabetes mellitus with hyperglycemia (HCC)   Patient is doing well on Ozempic  0.5mg  weekly.  Candice Kane is not experiencing much in terms of GI side effects.  Candice Kane is working on limiting simple carbohydrates and focusing on a protein rich diet.  Will repeat comprehensive metabolic panel as well as hemoglobin A1c and insulin  level today.  Candice Kane needs a refill of her Ozempic  today.      Relevant Medications   Semaglutide ,0.25 or 0.5MG /DOS, (OZEMPIC , 0.25 OR 0.5 MG/DOSE,) 2 MG/3ML SOPN   Other Relevant Orders   Comprehensive metabolic panel with GFR   Hemoglobin A1c   Insulin , random     Other   Obesity, Class III, BMI 40-49.9 (morbid obesity)   Anthropometric Measurements Height: 6\' 1"  (1.854 m) Weight:  (!) 323 lb (146.5 kg) BMI (Calculated): 42.62 Weight at Last Visit: 328 lb Weight Lost Since Last Visit: 5 lb Weight Gained Since Last Visit: 0 Starting Weight: 326 lb Total Weight Loss (lbs): 3 lb (1.361 kg) Body Composition  Body Fat %: 51.8 % Fat Mass (lbs): 167.8 lbs Muscle Mass (lbs): 148.2 lbs Total Body Water (lbs): 118.4 lbs Visceral Fat Rating : 15 Other Clinical Data Fasting: No Labs: No Today's Visit #: 9 Starting Date: 10/01/23       Relevant Medications   Semaglutide ,0.25 or 0.5MG /DOS, (OZEMPIC , 0.25 OR 0.5 MG/DOSE,) 2 MG/3ML SOPN   Microcytic anemia   Prior labs showed microcytic anemia and patient has a history of thalassemia trait.  Will repeat CBC today as well as anemia panel.      Relevant Orders   CBC w/Diff/Platelet   Anemia panel   Vitamin D  deficiency   Patient has been on prescription strength vitamin D  since initial labs were discussed in mid November 2024.  Will repeat vitamin D  level today to see how patient has responded to prescription strength supplementation and will make further supplementation Rex based on those results.  Improved fatigue.  Vitamin D  level ordered today as well as refill of prescription strength vitamin D .      Relevant Medications   Vitamin D , Ergocalciferol , (DRISDOL ) 1.25 MG (50000 UNIT) CAPS capsule  Other Relevant Orders   VITAMIN D  25 Hydroxy (Vit-D Deficiency, Fractures)   Abnormal TSH - Primary   Relevant Orders   TSH   Other Visit Diagnoses       Obesity with starting BMI of 43.1       Relevant Medications   Semaglutide ,0.25 or 0.5MG /DOS, (OZEMPIC , 0.25 OR 0.5 MG/DOSE,) 2 MG/3ML SOPN     BMI 40.0-44.9, adult (HCC)       Relevant Medications   Semaglutide ,0.25 or 0.5MG /DOS, (OZEMPIC , 0.25 OR 0.5 MG/DOSE,) 2 MG/3ML SOPN       No data recorded       04/14/2024    3:00 PM 03/03/2024    3:00 PM 02/11/2024    3:00 PM  Vitals with BMI  Height 6\' 1"  6\' 1"  6\' 1"   Weight 323 lbs 328 lbs 326 lbs  BMI 42.62  43.28 43.02  Systolic 116 101 782  Diastolic 75 64 82  Pulse 77 108 70      ASSESSMENT AND PLAN:  Diet: Candice Kane is currently in the action stage of change. As such, her goal is to continue with weight loss efforts and has agreed to the Category 3 Plan.   Exercise:  For additional and more extensive health benefits, adults should increase their aerobic physical activity to 300 minutes (5 hours) a week of moderate-intensity, or 150 minutes a week of vigorous-intensity aerobic physical activity, or an equivalent combination of moderate- and vigorous-intensity activity. Additional health benefits are gained by engaging in physical activity beyond this amount.   Behavior Modification:  We discussed the following Behavioral Modification Strategies today: increasing lean protein intake, decreasing simple carbohydrates, increasing vegetables, meal planning and cooking strategies, and keeping healthy foods in the home.   Return in about 4 weeks (around 05/12/2024).   Candice Kane was informed of the importance of frequent follow up visits to maximize her success with intensive lifestyle modifications for her multiple health conditions.  Attestation Statements:   Reviewed by clinician on day of visit: allergies, medications, problem list, medical history, surgical history, family history, social history, and previous encounter notes.   Donaciano Frizzle, MD

## 2024-04-14 NOTE — Assessment & Plan Note (Addendum)
 Last LDL elevated- patient not on medications.Goal cholesterol will be less than 70 given diabetes.  HDL at time of prior labs was 43 with triglycerides of 118.  Will repeat cholesterol panel today and discuss labs at next appointment.

## 2024-04-25 NOTE — Assessment & Plan Note (Signed)
 Patient has been on prescription strength vitamin D  since initial labs were discussed in mid November 2024.  Will repeat vitamin D  level today to see how patient has responded to prescription strength supplementation and will make further supplementation Rex based on those results.  Improved fatigue.  Vitamin D  level ordered today as well as refill of prescription strength vitamin D .

## 2024-04-25 NOTE — Assessment & Plan Note (Signed)
 Prior labs showed microcytic anemia and patient has a history of thalassemia trait.  Will repeat CBC today as well as anemia panel.

## 2024-04-25 NOTE — Assessment & Plan Note (Signed)
 Patient is doing well on Ozempic  0.5mg  weekly.  She is not experiencing much in terms of GI side effects.  She is working on limiting simple carbohydrates and focusing on a protein rich diet.  Will repeat comprehensive metabolic panel as well as hemoglobin A1c and insulin  level today.  She needs a refill of her Ozempic  today.

## 2024-04-25 NOTE — Assessment & Plan Note (Signed)
 Anthropometric Measurements Height: 6\' 1"  (1.854 m) Weight: (!) 323 lb (146.5 kg) BMI (Calculated): 42.62 Weight at Last Visit: 328 lb Weight Lost Since Last Visit: 5 lb Weight Gained Since Last Visit: 0 Starting Weight: 326 lb Total Weight Loss (lbs): 3 lb (1.361 kg) Body Composition  Body Fat %: 51.8 % Fat Mass (lbs): 167.8 lbs Muscle Mass (lbs): 148.2 lbs Total Body Water (lbs): 118.4 lbs Visceral Fat Rating : 15 Other Clinical Data Fasting: No Labs: No Today's Visit #: 9 Starting Date: 10/01/23

## 2024-05-11 ENCOUNTER — Encounter: Payer: Self-pay | Admitting: Vascular Surgery

## 2024-05-11 ENCOUNTER — Ambulatory Visit: Payer: Medicaid Other | Attending: Vascular Surgery | Admitting: Vascular Surgery

## 2024-05-11 VITALS — BP 119/85 | HR 75 | Temp 98.0°F | Ht 73.0 in | Wt 319.0 lb

## 2024-05-11 DIAGNOSIS — I872 Venous insufficiency (chronic) (peripheral): Secondary | ICD-10-CM | POA: Insufficient documentation

## 2024-05-11 NOTE — Progress Notes (Signed)
 Patient ID: Candice Kane, female   DOB: Mar 03, 1986, 38 y.o.   MRN: [id number]  Reason for Consult: Follow-up   Referred by Alexander Iba, PA  Subjective:     HPI:  Candice Kane is a 38 y.o. female with history of symptomatic bilateral lower extremity varicose veins.  She states that these do run in her family.  She has had 2 previous children but with no plans for future children and her veins did worsen after childbirth.  She has been following with healthy weight and wellness and states that she has lost about 20 pounds.  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  Past Medical History:  Diagnosis Date   Alpha thalassemia silent carrier 06/15/2022   Anemia    Back pain    Chest pain    Gestational diabetes    History of chlamydia 12/01/2009   Lactose intolerance    Sciatica of right side    SOB (shortness of breath)    Vaginal delivery    2012, 2023   Varicose veins    Family History  Problem Relation Age of Onset   Cancer Mother        breast   Kidney failure Mother    Stroke Mother    Stroke Father    Diabetes Father    Hyperlipidemia Father    Hypertension Father    Drug abuse Father    Colon cancer Maternal Grandmother        colon   COPD Maternal Grandfather    Past Surgical History:  Procedure Laterality Date   ADENOIDECTOMY     BACK SURGERY  08/14/2021   TONSILLECTOMY      Short Social History:  Social History   Tobacco Use   Smoking status: Former    Types: Cigars   Smokeless tobacco: Never  Substance Use Topics   Alcohol use: Yes    Comment: Occasionally    No Known Allergies  Current Outpatient Medications  Medication Sig Dispense Refill   ibuprofen  (ADVIL ) 600 MG tablet Take 1 tablet (600 mg total) by mouth every 6 (six) hours. 30 tablet 0   Insulin  Pen Needle (BD PEN NEEDLE NANO 2ND GEN) 32G X 4 MM MISC 1 Package by Does not apply route 2 (two) times  daily. 100 each 0   Semaglutide ,0.25 or 0.5MG /DOS, (OZEMPIC , 0.25 OR 0.5 MG/DOSE,) 2 MG/3ML SOPN Inject 0.5 mg into the skin once a week. 3 mL 0   VENTOLIN  HFA 108 (90 Base) MCG/ACT inhaler Inhale into the lungs.     Vitamin D , Ergocalciferol , (DRISDOL ) 1.25 MG (50000 UNIT) CAPS capsule Take 1 capsule (50,000 Units total) by mouth every 7 (seven) days. 12 capsule 0   No current facility-administered medications for this visit.    Review of Systems  Constitutional:  Constitutional negative. HENT: HENT negative.  Eyes: Eyes negative.  Respiratory: Respiratory negative.  Cardiovascular: Positive for leg swelling.  GI: Gastrointestinal negative.  Musculoskeletal: Positive for leg pain.  Neurological: Neurological negative. Hematologic: Hematologic/lymphatic negative.  Psychiatric: Psychiatric negative.        Objective:  Objective   Vitals:   05/11/24 1138  BP: 119/85  Pulse: 75  Temp: 98 F (36.7 C)  SpO2: 97%  Weight: (!) 319 lb (144.7 kg)  Height: 6' 1 (1.854 m)   Body mass index is 42.09 kg/m.  Physical Exam HENT:  Head: Normocephalic.     Nose: Nose normal.  Eyes:     Pupils: Pupils are equal, round, and reactive to light.  Cardiovascular:     Rate and Rhythm: Normal rate.     Pulses: Normal pulses.  Pulmonary:     Effort: Pulmonary effort is normal.  Abdominal:     General: Abdomen is flat.  Musculoskeletal:     Cervical back: Normal range of motion.     Right lower leg: Edema present.     Left lower leg: Edema present.  Skin:    General: Skin is warm.     Capillary Refill: Capillary refill takes less than 2 seconds.  Neurological:     General: No focal deficit present.     Mental Status: She is alert.  Psychiatric:        Mood and Affect: Mood normal.        Thought Content: Thought content normal.        Judgment: Judgment normal.            Data: Venous Reflux Times   +--------------+---------+------+-----------+------------+--------+  RIGHT        Reflux NoRefluxReflux TimeDiameter cmsComments                          Yes                                   +--------------+---------+------+-----------+------------+--------+  CFV          no                                              +--------------+---------+------+-----------+------------+--------+  FV prox       no                                              +--------------+---------+------+-----------+------------+--------+  FV mid        no                                              +--------------+---------+------+-----------+------------+--------+  FV dist       no                                              +--------------+---------+------+-----------+------------+--------+  Popliteal    no                                              +--------------+---------+------+-----------+------------+--------+  GSV at SFJ    no                            .842              +--------------+---------+------+-----------+------------+--------+  GSV prox thigh  yes    >500 ms      .652              +--------------+---------+------+-----------+------------+--------+  GSV mid thigh no                            .321              +--------------+---------+------+-----------+------------+--------+  GSV dist thighno                            .426              +--------------+---------+------+-----------+------------+--------+  GSV at knee   no                            .304              +--------------+---------+------+-----------+------------+--------+  GSV prox calf           yes    >500 ms      .377              +--------------+---------+------+-----------+------------+--------+  GSV mid calf            yes    >500 ms      .283               +--------------+---------+------+-----------+------------+--------+  SSV Pop Fossa no                            .261              +--------------+---------+------+-----------+------------+--------+  SSV prox calf no                            .132              +--------------+---------+------+-----------+------------+--------+  SSV mid calf  no                            .173              +--------------+---------+------+-----------+------------+--------+        Summary:  Right:  - No evidence of deep vein thrombosis seen in the right lower extremity,  from the common femoral through the popliteal veins.  - No evidence of superficial venous thrombosis in the right lower  extremity.  - Venous reflux is noted in the right greater saphenous vein in the thigh.  - Venous reflux is noted in the right greater saphenous vein in the calf.      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  I evaluated what appears to be an anterior sensory saphenous vein on the right which is very large and within the fascia throughout the upper leg and gives rise to a very large thigh varicosity.  On the left side she also has a large great saphenous vein and then varicosities coming off the posterior and lateral leg.  Her previous ultrasound did not substantiate these findings and at the time she was not compliant with thigh-high compression stockings.  Given the time course since her last ultrasound I will have her follow-up and continue with compression stockings bilaterally above the knee and  we will repeat venous insufficiency studies at that time and discuss our options moving forward.  She will continue to follow with healthy weight and wellness as she needs continued weight loss.     Adine Hoof MD [facility]

## 2024-05-12 ENCOUNTER — Other Ambulatory Visit: Payer: Self-pay | Admitting: *Deleted

## 2024-05-12 DIAGNOSIS — I83893 Varicose 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 complications: Secondary | ICD-10-CM

## 2024-05-12 DIAGNOSIS — I872 Venous insufficiency (chronic) (peripheral): Secondary | ICD-10-CM

## 2024-05-23 IMAGING — US US MFM FETAL BPP W/ NON-STRESS
1 series · 13 of 26 positions shown · non-contrast
Comparison: none

[Series 1: us mfm fetal bpp w/ non-stress · 26 acquisitions, 13 frames shown]
[im 2/26]
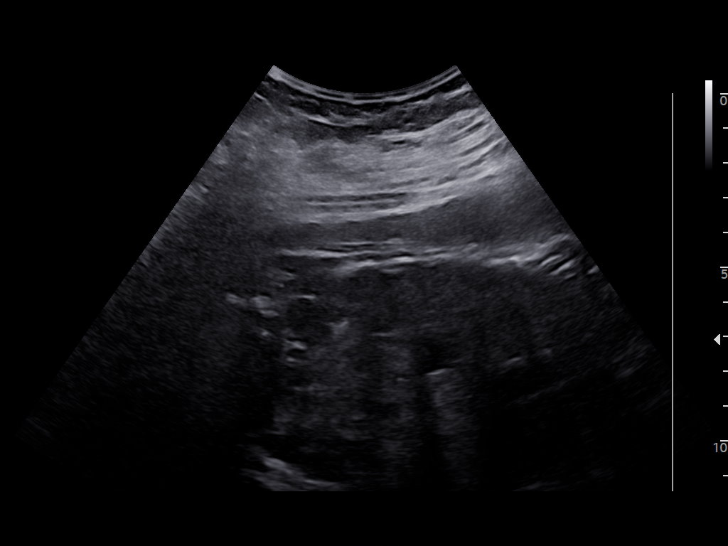
[im 4/26]
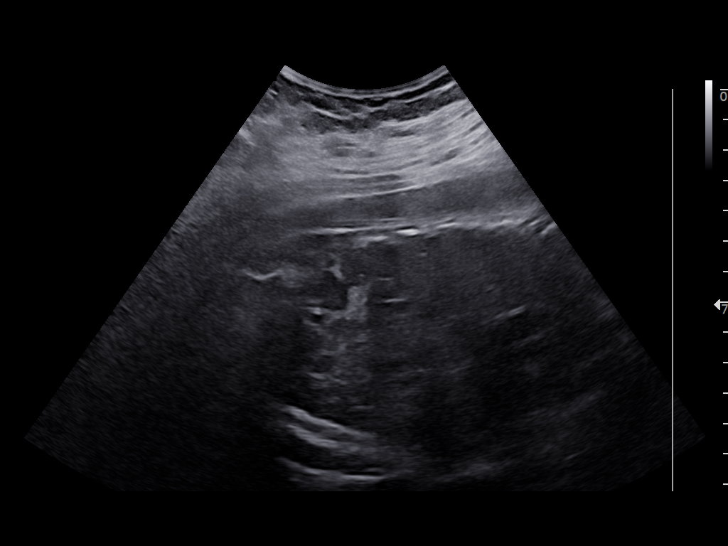
[im 6/26]
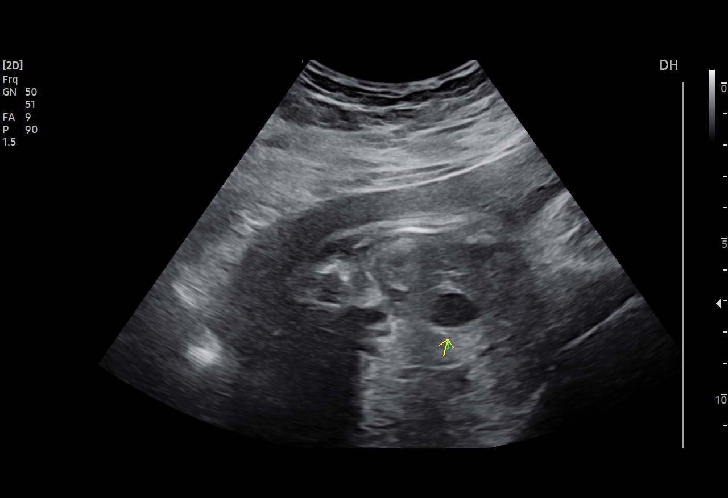
[im 8/26]
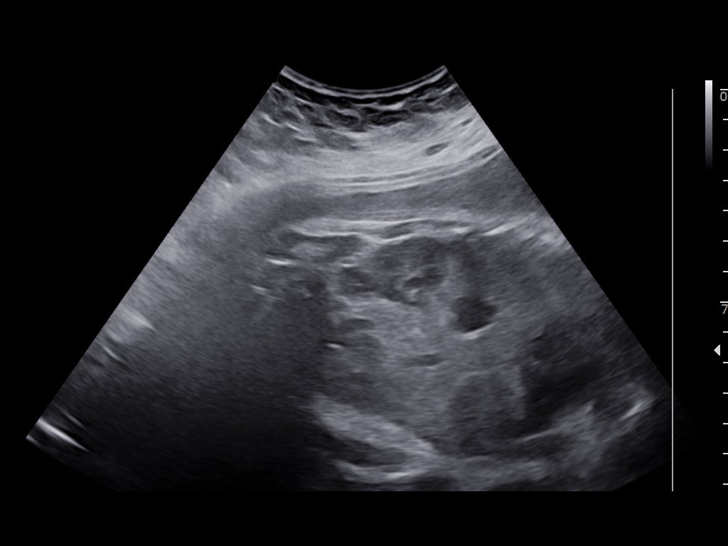
[im 10/26]
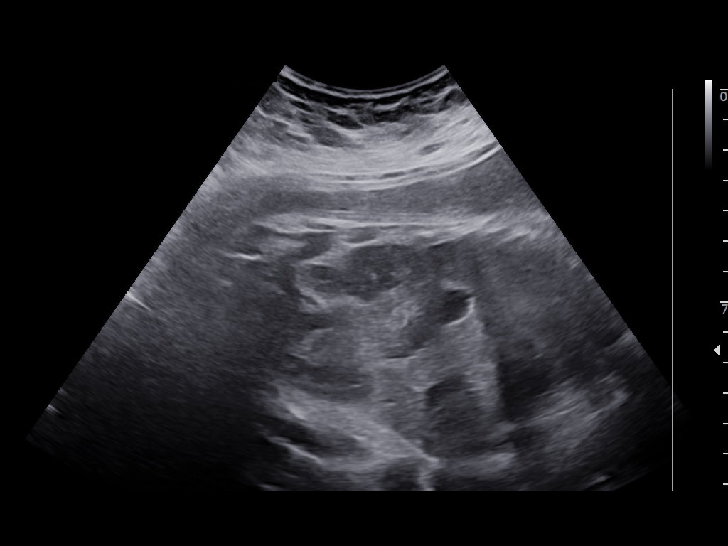
[im 12/26]
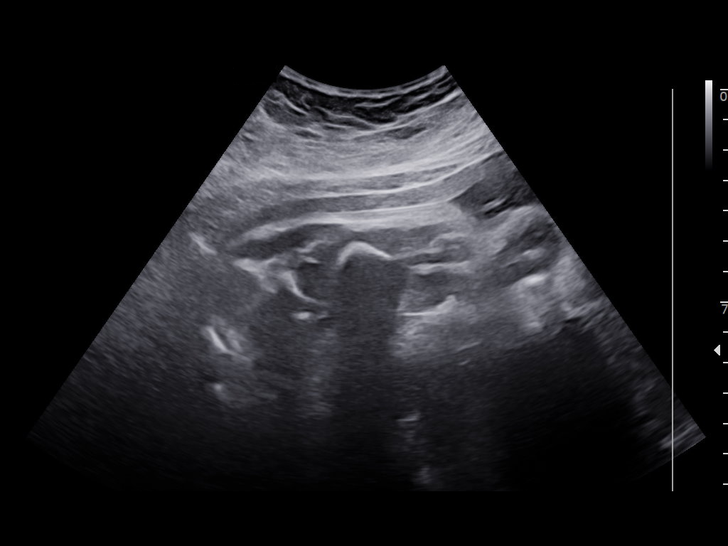
[im 14/26]
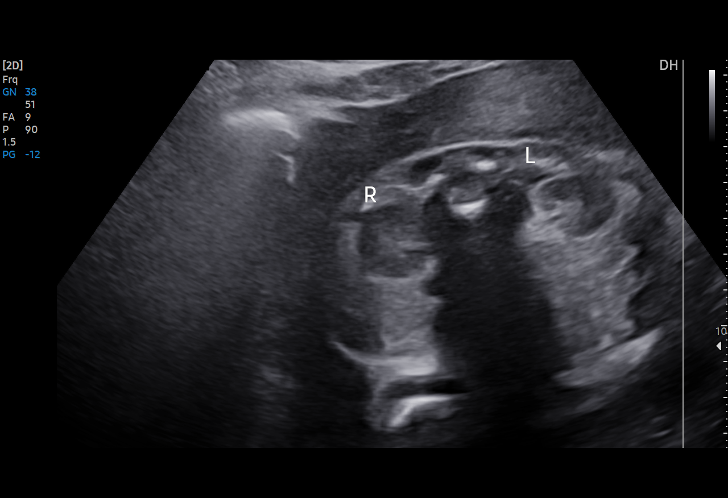
[im 16/26]
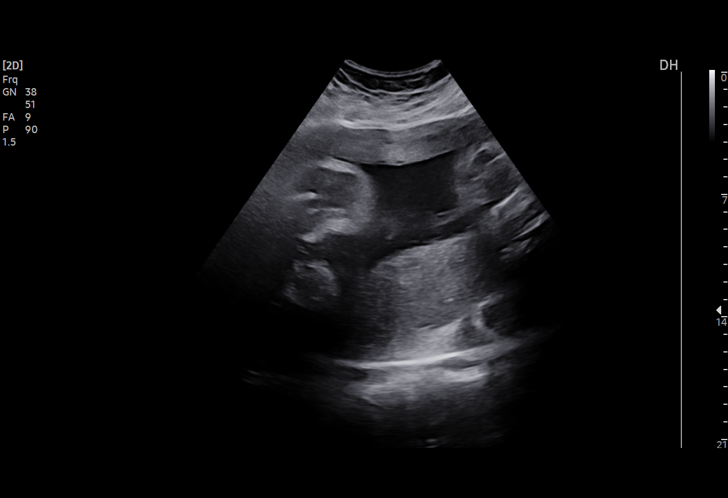
[im 18/26]
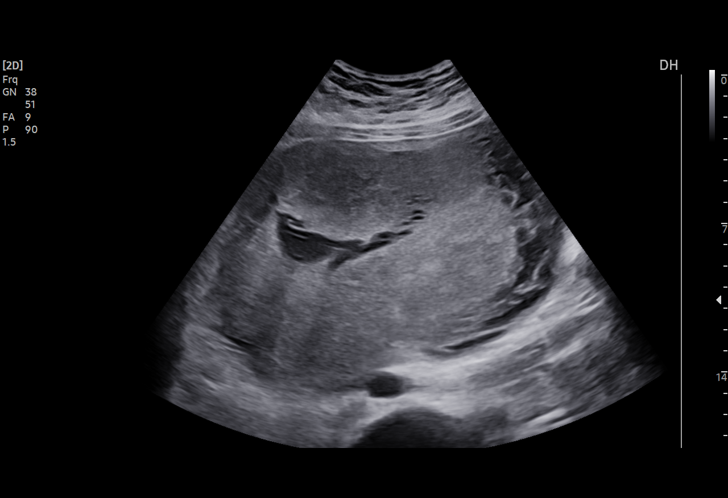
[im 20/26]
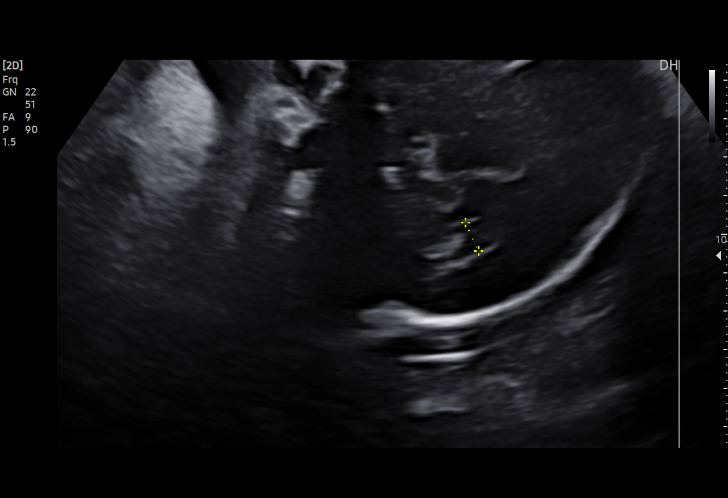
[im 22/26]
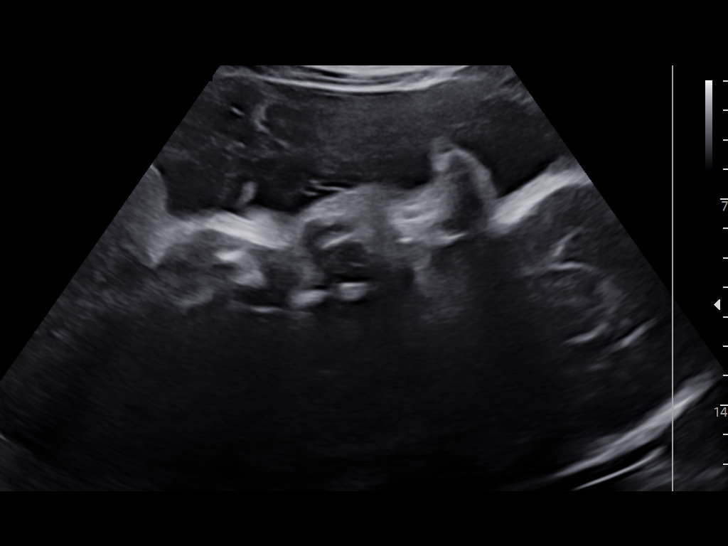
[im 24/26]
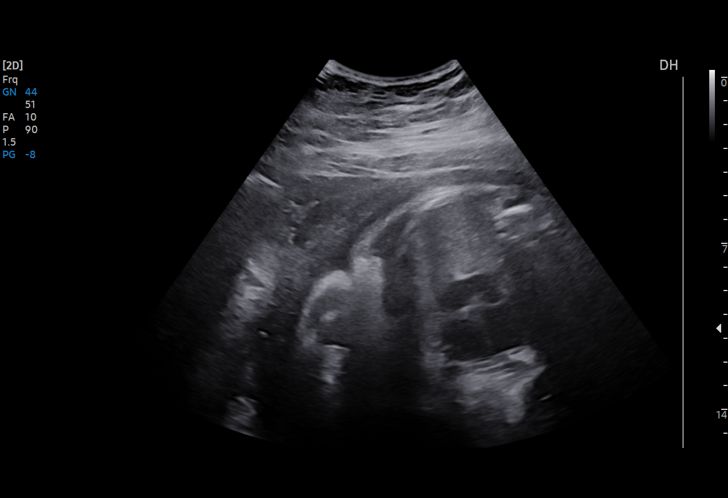
[im 26/26]
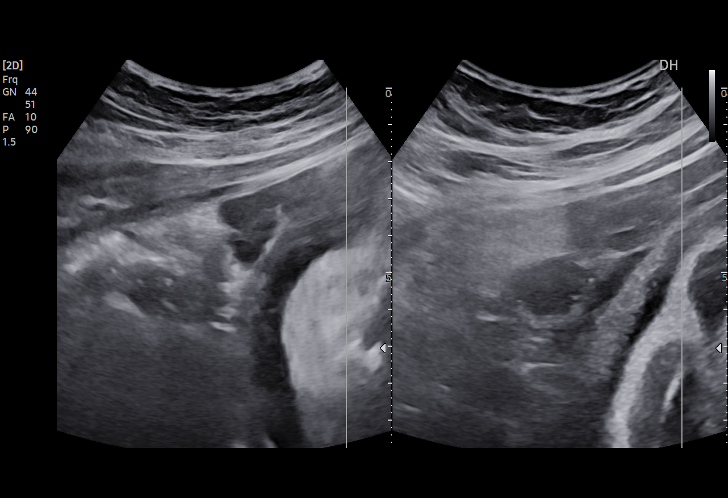

[13 of 26 positions shown; findings below may reference images not displayed]

W/NONSTRESS

Indications

 Gestational diabetes in pregnancy,
 controlled by oral hypoglycemic drugs
 34 weeks gestation of pregnancy
 Obesity complicating pregnancy, third
 trimester (BMI 39)
 LR NIPS
 Marginal insertion of umbilical cord affecting
 management of mother in third trimester
 Advanced maternal age multigravida 35+,
 third trimester (35 years)
Fetal Evaluation

 Num Of Fetuses:         1
 Fetal Heart Rate(bpm):  135
 Cardiac Activity:       Observed
 Presentation:           Cephalic
 Placenta:               Posterior
 P. Cord Insertion:      Marginal insertion prev seen

 Amniotic Fluid
 AFI FV:      Subjectively low-normal

 AFI Sum(cm)     %Tile       Largest Pocket(cm)
 8.22            6

 RUQ(cm)       RLQ(cm)       LUQ(cm)        LLQ(cm)

Biophysical Evaluation

 Amniotic F.V:   Pocket => 2 cm             F. Tone:        Observed
 F. Movement:    Not Observed               N.S.T:          Reactive
 F. Breathing:   Observed                   Score:          [DATE]
Biometry

 LV:        8.1  mm
OB History

 Gravidity:    4         Term:   1        Prem:   0        SAB:   1
 TOP:          1       Ectopic:  0        Living: 1
Gestational Age

 LMP:           34w 0d        Date:  09/23/21                 EDD:   06/30/22
 Best:          34w 0d     Det. By:  LMP  (09/23/21)          EDD:   06/30/22
Anatomy

 Cranium:               Previously seen        Aortic Arch:            Previously seen
 Cavum:                 Previously seen        Ductal Arch:            Previously seen
 Ventricles:            Appears normal         Diaphragm:              Appears normal
 Choroid Plexus:        Previously seen        Stomach:                Appears normal, left
                                                                       sided
 Cerebellum:            Previously seen        Abdomen:                Previously seen
 Posterior Fossa:       Previously seen        Abdominal Wall:         Previously seen
 Nuchal Fold:           Previously seen        Cord Vessels:           Previously seen
 Face:                  Not well visualized    Kidneys:                Appear normal
 Lips:                  Not well visualized    Bladder:                Appears normal
 Thoracic:              Appears normal         Spine:                  Previously seen
 Heart:                 Not well visualized    Upper Extremities:      Previously seen
 RVOT:                  Not well visualized    Lower Extremities:      Previously seen
 LVOT:                  Not well visualized

 Other:  Open hands/5th digits prev visualized. Male gender previously seen.
         Technically difficult due to advanced GA and fetal position.
Cervix Uterus Adnexa

 Cervix
 Not visualized (advanced GA >74wks)

 Uterus
 No abnormality visualized.

 Right Ovary
 Not visualized.

 Left Ovary
 Not visualized.

 Cul De Sac
 No free fluid seen.
 Adnexa
 No adnexal mass visualized.
Comments

 This patient was seen due to gestational diabetes treated
 with metformin and maternal obesity.  She denies any
 problems since her last exam.
 A BPP performed today was [DATE].  She received a -2 for
 fetal movements that did not meet criteria.  She subsequently
 had a reactive NST making her total biophysical profile score
 [DATE].
 She will return in 1 week for another BPP.

## 2024-05-25 ENCOUNTER — Ambulatory Visit (INDEPENDENT_AMBULATORY_CARE_PROVIDER_SITE_OTHER): Admitting: Family Medicine

## 2024-05-25 ENCOUNTER — Encounter (INDEPENDENT_AMBULATORY_CARE_PROVIDER_SITE_OTHER): Payer: Self-pay | Admitting: Family Medicine

## 2024-05-25 VITALS — BP 109/73 | HR 86 | Temp 98.0°F | Ht 73.0 in | Wt 312.0 lb

## 2024-05-25 DIAGNOSIS — Z6841 Body Mass Index (BMI) 40.0 and over, adult: Secondary | ICD-10-CM | POA: Diagnosis not present

## 2024-05-25 DIAGNOSIS — R7989 Other specified abnormal findings of blood chemistry: Secondary | ICD-10-CM | POA: Diagnosis not present

## 2024-05-25 DIAGNOSIS — E1165 Type 2 diabetes mellitus with hyperglycemia: Secondary | ICD-10-CM

## 2024-05-25 DIAGNOSIS — Z7985 Long-term (current) use of injectable non-insulin antidiabetic drugs: Secondary | ICD-10-CM

## 2024-05-25 MED ORDER — OZEMPIC (0.25 OR 0.5 MG/DOSE) 2 MG/3ML ~~LOC~~ SOPN
0.5000 mg | PEN_INJECTOR | SUBCUTANEOUS | 0 refills | Status: DC
Start: 2024-05-25 — End: 2024-06-28

## 2024-05-25 NOTE — Progress Notes (Deleted)
   SUBJECTIVE:  Chief Complaint: Obesity  Interim History: ***  Neysha is here to discuss her progress with her obesity treatment plan. She is on the {HWW Weight Loss Plan:210964005} and states she {CHL AMB IS/IS NOT:210130109} following her eating plan approximately *** % of the time. She states she {CHL AMB IS/IS NOT:210130109} exercising *** minutes *** times per week.   OBJECTIVE: Visit Diagnoses: Problem List Items Addressed This Visit       Endocrine   Type 2 diabetes mellitus with hyperglycemia (HCC)    Vitals Temp: 98 F (36.7 C) BP: 109/73 Pulse Rate: 86 SpO2: 99 %   Anthropometric Measurements Height: 6' 1 (1.854 m) Weight: (!) 312 lb (141.5 kg) BMI (Calculated): 41.17 Weight at Last Visit: 323 lb Weight Lost Since Last Visit: 11 lb Weight Gained Since Last Visit: 0 Starting Weight: 326 lb Total Weight Loss (lbs): 14 lb (6.35 kg) Peak Weight: 338 lb   Body Composition  Body Fat %: 47.8 % Fat Mass (lbs): 149.6 lbs Muscle Mass (lbs): 155 lbs Total Body Water (lbs): 115.2 lbs Visceral Fat Rating : 13   Other Clinical Data Fasting: yes Labs: no Today's Visit #: 10 Starting Date: 10/01/23     ASSESSMENT AND PLAN:  Diet: Rayanne is currently in the action stage of change. As such, her goal is to continue with weight loss efforts and has agreed to {MWMwtlossportion/plan2:23431}.   Exercise:  {MWM Exercise Recommendations:210964029}  Behavior Modification:  We discussed the following Behavioral Modification Strategies today: {HWW Behavior Modification:210964008}. We discussed various medication options to help Zakkiyya with her weight loss efforts and we both agreed to ***.  No follow-ups on file.   She was informed of the importance of frequent follow up visits to maximize her success with intensive lifestyle modifications for her multiple health conditions.  Attestation Statements:   Reviewed by clinician on day of visit: allergies,  medications, problem list, medical history, surgical history, family history, social history, and previous encounter notes.   Time spent on visit including pre-visit chart review and post-visit care and charting was *** minutes  [name], MD

## 2024-05-25 NOTE — Progress Notes (Signed)
 SUBJECTIVE:  Chief Complaint: Obesity  Interim History: since last appointment patient voices her exercise has increased significantly since last appointment.  She didn't realize how exhausting going to the trampoline park would be.  She started walking at night because its so hot during the day.  She was sick last week with a stomach virus and couldn't keep anything inside.  She is trying to increase her protein intake daily.  Planning to get an ultrasound in her leg to evaluate the veins in her leg and was told she may have reflux legs.   Candice Kane is here to discuss her progress with her obesity treatment plan. She is on the Category 3 Plan and states she is following her eating plan approximately 75 % of the time. She states she is exercising at trampoline park, walking and swimming.    OBJECTIVE: Visit Diagnoses: Problem List Items Addressed This Visit       Endocrine   Type 2 diabetes mellitus with hyperglycemia (HCC) - Primary   Patient doing well on Ozempic  with good control of dietary intake.  No change in dose but refill needed today.  Will re-evaluate treatment at next appointment.      Relevant Medications   Semaglutide ,0.25 or 0.5MG /DOS, (OZEMPIC , 0.25 OR 0.5 MG/DOSE,) 2 MG/3ML SOPN     Other   Abnormal TSH   Repeat TSH today- will discuss further management at that time.      Morbid obesity (HCC)   Anthropometric Measurements Height: 6' 1 (1.854 m) Weight: (!) 312 lb (141.5 kg) BMI (Calculated): 41.17 Weight at Last Visit: 323 lb Weight Lost Since Last Visit: 11 lb Weight Gained Since Last Visit: 0 Starting Weight: 326 lb Total Weight Loss (lbs): 14 lb (6.35 kg) Peak Weight: 338 lb Body Composition  Body Fat %: 47.8 % Fat Mass (lbs): 149.6 lbs Muscle Mass (lbs): 155 lbs Total Body Water (lbs): 115.2 lbs Visceral Fat Rating : 13 Other Clinical Data Fasting: yes Labs: no Today's Visit #: 10 Starting Date: 10/01/23       Relevant Medications    Semaglutide ,0.25 or 0.5MG /DOS, (OZEMPIC , 0.25 OR 0.5 MG/DOSE,) 2 MG/3ML SOPN   Other Visit Diagnoses       BMI 40.0-44.9, adult (HCC)       Relevant Medications   Semaglutide ,0.25 or 0.5MG /DOS, (OZEMPIC , 0.25 OR 0.5 MG/DOSE,) 2 MG/3ML SOPN       No data recorded       05/25/2024   12:00 PM 05/11/2024   11:38 AM 04/14/2024    3:00 PM  Vitals with BMI  Height 6' 1 6' 1 6' 1  Weight 312 lbs 319 lbs 323 lbs  BMI 41.17 42.1 42.62  Systolic 109 119 883  Diastolic 73 85 75  Pulse 86 75 77      ASSESSMENT AND PLAN:  Diet: Candice Kane is currently in the action stage of change. As such, her goal is to continue with weight loss efforts and has agreed to the Category 3 Plan. Smart fruit handout given to patient to incorporate lower sugar fruits.  Exercise:  For substantial health benefits, adults should do at least 150 minutes (2 hours and 30 minutes) a week of moderate-intensity, or 75 minutes (1 hour and 15 minutes) a week of vigorous-intensity aerobic physical activity, or an equivalent combination of moderate- and vigorous-intensity aerobic activity. Aerobic activity should be performed in episodes of at least 10 minutes, and preferably, it should be spread throughout the week.  Behavior Modification:  We discussed  the following Behavioral Modification Strategies today: increasing lean protein intake, decreasing simple carbohydrates, increasing vegetables, avoiding temptations, and planning for success.   Return in about 4 weeks (around 06/22/2024).   She was informed of the importance of frequent follow up visits to maximize her success with intensive lifestyle modifications for her multiple health conditions.  Attestation Statements:   Reviewed by clinician on day of visit: allergies, medications, problem list, medical history, surgical history, family history, social history, and previous encounter notes.     Candice Cho, MD

## 2024-05-26 LAB — ANEMIA PANEL
Ferritin: 20 ng/mL (ref 15–150)
Folate, Hemolysate: 240 ng/mL
Folate, RBC: 767 ng/mL (ref >498)
Hematocrit: 31.3 % — ABNORMAL LOW (ref 34.0–46.6)
Iron Saturation: 7 % — CL (ref 15–55)
Iron: 24 ug/dL — ABNORMAL LOW (ref 27–159)
Retic Ct Pct: 0.9 % (ref 0.6–2.6)
Total Iron Binding Capacity: 356 ug/dL (ref 250–450)
UIBC: 332 ug/dL (ref 131–425)
Vitamin B-12: 320 pg/mL (ref 232–1245)

## 2024-05-26 LAB — HEMOGLOBIN A1C
Est. average glucose Bld gHb Est-mCnc: 126 mg/dL
Hgb A1c MFr Bld: 6 % — ABNORMAL HIGH (ref 4.8–5.6)

## 2024-05-26 LAB — COMPREHENSIVE METABOLIC PANEL WITH GFR
ALT: 9 IU/L (ref 0–32)
AST: 10 IU/L (ref 0–40)
Albumin: 4.2 g/dL (ref 3.9–4.9)
Alkaline Phosphatase: 109 IU/L (ref 44–121)
BUN/Creatinine Ratio: 9 (ref 9–23)
BUN: 7 mg/dL (ref 6–20)
Bilirubin Total: <0.2 mg/dL (ref 0.0–1.2)
CO2: 22 mmol/L (ref 20–29)
Calcium: 9 mg/dL (ref 8.7–10.2)
Chloride: 101 mmol/L (ref 96–106)
Creatinine, Ser: 0.81 mg/dL (ref 0.57–1.00)
Globulin, Total: 2.7 g/dL (ref 1.5–4.5)
Glucose: 93 mg/dL (ref 70–99)
Potassium: 4.1 mmol/L (ref 3.5–5.2)
Sodium: 138 mmol/L (ref 134–144)
Total Protein: 6.9 g/dL (ref 6.0–8.5)
eGFR: 96 mL/min/{1.73_m2} (ref >59)

## 2024-05-26 LAB — CBC WITH DIFFERENTIAL/PLATELET
Basophils Absolute: 0 10*3/uL (ref 0.0–0.2)
Basos: 1 %
EOS (ABSOLUTE): 0.1 10*3/uL (ref 0.0–0.4)
Eos: 1 %
Hemoglobin: 9.4 g/dL — ABNORMAL LOW (ref 11.1–15.9)
Immature Grans (Abs): 0 10*3/uL (ref 0.0–0.1)
Immature Granulocytes: 0 %
Lymphocytes Absolute: 2.5 10*3/uL (ref 0.7–3.1)
Lymphs: 39 %
MCH: 23.1 pg — ABNORMAL LOW (ref 26.6–33.0)
MCHC: 30 g/dL — ABNORMAL LOW (ref 31.5–35.7)
MCV: 77 fL — ABNORMAL LOW (ref 79–97)
Monocytes Absolute: 0.5 10*3/uL (ref 0.1–0.9)
Monocytes: 8 %
Neutrophils Absolute: 3.3 10*3/uL (ref 1.4–7.0)
Neutrophils: 51 %
Platelets: 502 10*3/uL — ABNORMAL HIGH (ref 150–450)
RBC: 4.07 x10E6/uL (ref 3.77–5.28)
RDW: 17.5 % — ABNORMAL HIGH (ref 11.7–15.4)
WBC: 6.3 10*3/uL (ref 3.4–10.8)

## 2024-05-26 LAB — LIPID PANEL WITH LDL/HDL RATIO
Cholesterol, Total: 195 mg/dL (ref 100–199)
HDL: 34 mg/dL — ABNORMAL LOW (ref >39)
LDL Chol Calc (NIH): 141 mg/dL — ABNORMAL HIGH (ref 0–99)
LDL/HDL Ratio: 4.1 ratio — ABNORMAL HIGH (ref 0.0–3.2)
Triglycerides: 110 mg/dL (ref 0–149)
VLDL Cholesterol Cal: 20 mg/dL (ref 5–40)

## 2024-05-26 LAB — VITAMIN D 25 HYDROXY (VIT D DEFICIENCY, FRACTURES): Vit D, 25-Hydroxy: 28.3 ng/mL — ABNORMAL LOW (ref 30.0–100.0)

## 2024-05-26 LAB — TSH: TSH: 1.05 u[IU]/mL (ref 0.450–4.500)

## 2024-05-26 LAB — INSULIN, RANDOM: INSULIN: 11.6 u[IU]/mL (ref 2.6–24.9)

## 2024-06-04 NOTE — Assessment & Plan Note (Signed)
 Repeat TSH today- will discuss further management at that time.

## 2024-06-04 NOTE — Assessment & Plan Note (Signed)
 Patient doing well on Ozempic  with good control of dietary intake.  No change in dose but refill needed today.  Will re-evaluate treatment at next appointment.

## 2024-06-04 NOTE — Assessment & Plan Note (Signed)
 Anthropometric Measurements Height: 6' 1 (1.854 m) Weight: (!) 312 lb (141.5 kg) BMI (Calculated): 41.17 Weight at Last Visit: 323 lb Weight Lost Since Last Visit: 11 lb Weight Gained Since Last Visit: 0 Starting Weight: 326 lb Total Weight Loss (lbs): 14 lb (6.35 kg) Peak Weight: 338 lb Body Composition  Body Fat %: 47.8 % Fat Mass (lbs): 149.6 lbs Muscle Mass (lbs): 155 lbs Total Body Water (lbs): 115.2 lbs Visceral Fat Rating : 13 Other Clinical Data Fasting: yes Labs: no Today's Visit #: 10 Starting Date: 10/01/23

## 2024-06-15 ENCOUNTER — Ambulatory Visit (HOSPITAL_COMMUNITY)
Admission: RE | Admit: 2024-06-15 | Discharge: 2024-06-15 | Disposition: A | Discharge status: Home / Self Care | Source: Ambulatory Visit | Attending: Vascular Surgery | Admitting: Vascular Surgery

## 2024-06-15 ENCOUNTER — Ambulatory Visit (INDEPENDENT_AMBULATORY_CARE_PROVIDER_SITE_OTHER): Admitting: Vascular Surgery

## 2024-06-15 ENCOUNTER — Encounter: Payer: Self-pay | Admitting: Vascular Surgery

## 2024-06-15 VITALS — BP 112/74 | HR 90 | Temp 98.1°F | Ht 73.0 in | Wt 317.0 lb

## 2024-06-15 DIAGNOSIS — I83893 Varicose 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 complications: Secondary | ICD-10-CM

## 2024-06-15 DIAGNOSIS — I872 Venous insufficiency (chronic) (peripheral): Secondary | ICD-10-CM | POA: Diagnosis present

## 2024-06-15 NOTE — Progress Notes (Signed)
 Patient ID: Candice Kane, female   DOB: 1986-08-29, 38 y.o.   MRN: [id number]  Reason for Consult: Follow-up   Referred by Job Lukes, PA  Subjective:     HPI:  Candice Kane is a 38 y.o. female history of symptomatic bilateral lower extremity varicosities with associated swelling.  Her father side of the family has varicose veins.  She has never had any bleeding or clotting denies any history of DVT.  She does have 2 children which exacerbated her veins and today is her youngest son's birth that he is 23 years old.  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  Mostly her varicosities hurt particularly over the medial thigh and medial leg.  She has been wearing compression stockings religiously since our last visit.  Past Medical History:  Diagnosis Date   Alpha thalassemia silent carrier 06/15/2022   Anemia    Back pain    Chest pain    Gestational diabetes    History of chlamydia 12/01/2009   Lactose intolerance    Sciatica of right side    SOB (shortness of breath)    Vaginal delivery    2012, 2023   Varicose veins    Family History  Problem Relation Age of Onset   Cancer Mother        breast   Kidney failure Mother    Stroke Mother    Stroke Father    Diabetes Father    Hyperlipidemia Father    Hypertension Father    Drug abuse Father    Colon cancer Maternal Grandmother        colon   COPD Maternal Grandfather    Past Surgical History:  Procedure Laterality Date   ADENOIDECTOMY     BACK SURGERY  08/14/2021   TONSILLECTOMY      Short Social History:  Social History   Tobacco Use   Smoking status: Former    Types: Cigars   Smokeless tobacco: Never  Substance Use Topics   Alcohol use: Yes    Comment: Occasionally    No Known Allergies  Current Outpatient Medications  Medication Sig Dispense Refill   ibuprofen  (ADVIL ) 600 MG tablet Take 1 tablet (600 mg total) by mouth every  6 (six) hours. 30 tablet 0   Insulin  Pen Needle (BD PEN NEEDLE NANO 2ND GEN) 32G X 4 MM MISC 1 Package by Does not apply route 2 (two) times daily. 100 each 0   Semaglutide ,0.25 or 0.5MG /DOS, (OZEMPIC , 0.25 OR 0.5 MG/DOSE,) 2 MG/3ML SOPN Inject 0.5 mg into the skin once a week. 3 mL 0   VENTOLIN  HFA 108 (90 Base) MCG/ACT inhaler Inhale into the lungs.     Vitamin D , Ergocalciferol , (DRISDOL ) 1.25 MG (50000 UNIT) CAPS capsule Take 1 capsule (50,000 Units total) by mouth every 7 (seven) days. 12 capsule 0   No current facility-administered medications for this visit.    Review of Systems  Constitutional:  Constitutional negative. HENT: HENT negative.  Eyes: Eyes negative.  Cardiovascular: Positive for leg swelling.  GI: Gastrointestinal negative.  Musculoskeletal: Positive for leg pain.  Neurological: Neurological negative. Hematologic: Hematologic/lymphatic negative.  Psychiatric: Psychiatric negative.        Objective:  Objective   Vitals:   06/15/24 1529  BP: 112/74  Pulse: 90  Temp: 98.1 F (36.7 C)  SpO2: 94%  Weight: (!) 317 lb (143.8 kg)  Height: 6' 1 (  1.854 m)   Body mass index is 41.82 kg/m.  Physical Exam HENT:     Head: Normocephalic.     Nose: Nose normal.  Eyes:     Pupils: Pupils are equal, round, and reactive to light.  Cardiovascular:     Rate and Rhythm: Normal rate.     Pulses: Normal pulses.  Pulmonary:     Effort: Pulmonary effort is normal.  Abdominal:     General: Abdomen is flat.  Musculoskeletal:     Right lower leg: Edema present.     Left lower leg: Edema present.  Skin:    General: Skin is warm.     Capillary Refill: Capillary refill takes less than 2 seconds.  Neurological:     General: No focal deficit present.     Mental Status: She is alert.            Data: Venous Reflux Times  +--------------+---------+------+-----------+------------+--------+  RIGHT        Reflux NoRefluxReflux TimeDiameter cmsComments                           Yes                                   +--------------+---------+------+-----------+------------+--------+  CFV                    yes   >1 second                       +--------------+---------+------+-----------+------------+--------+  FV mid        no                                              +--------------+---------+------+-----------+------------+--------+  Popliteal    no                                              +--------------+---------+------+-----------+------------+--------+  GSV at SFJ              yes    >500 ms      0.86              +--------------+---------+------+-----------+------------+--------+  GSV prox thighno                            0.63              +--------------+---------+------+-----------+------------+--------+  GSV mid thigh           yes    >500 ms      0.66              +--------------+---------+------+-----------+------------+--------+  GSV dist thighno                            0.35              +--------------+---------+------+-----------+------------+--------+  GSV at knee   no  0.33              +--------------+---------+------+-----------+------------+--------+  GSV prox calf           yes    >500 ms      0.47              +--------------+---------+------+-----------+------------+--------+  GSV mid calf            yes    >500 ms      0.3               +--------------+---------+------+-----------+------------+--------+  GSV dist calf           yes    >500 ms      0.35              +--------------+---------+------+-----------+------------+--------+  SSV at [facility]    no                            0.29              +--------------+---------+------+-----------+------------+--------+  SSV prox calf           yes    >500 ms      0.28               +--------------+---------+------+-----------+------------+--------+         Summary:  Right:  - No evidence of deep vein thrombosis seen in the right lower extremity,  from the common femoral through the popliteal veins.  - No evidence of superficial venous thrombosis in the right lower  extremity.  - Venous reflux is noted in the right common femoral vein.  - Venous reflux is noted in the right sapheno-femoral junction.  - Venous reflux is noted in the right greater saphenous vein in the thigh.  - Venous reflux is noted in the right greater saphenous vein in the calf.  - Venous reflux is noted in the right short saphenous vein.    Great saphenous vein evaluated at the bedside today and is very large from the knee all the way up to the fascia measuring at least 0.5 cm.  There is a anterior sensory saphenous vein that joins to the great saphenous vein also appears to give rise to multiple varicosities consistent with reflux.     Assessment/Plan:    38 year old female with C3 venous disease which appears to be due to a refluxing greater saphenous vein throughout her right lower extremity.  I evaluated this at the bedside today and it is very large from the knee all the way up and is within the fascia.  There is also an anterior accessory saphenous branch which comes then in the high thigh this also appears to be reflux and gives rise to multiple varicose veins.  Given the ultrasound findings patient would significantly benefit from saphenous vein ablation and stab phlebectomy for greater than 20 given her symptoms at the site of the varicosities as well as associated swelling.  She will continue with thigh-high compression stockings and we will get this scheduled in the near future.  Will need to evaluate the left lower extremity in the future where she also has very large varicosities particularly posterior laterally.     Penne Lonni Colorado MD [facility]

## 2024-06-28 ENCOUNTER — Encounter (INDEPENDENT_AMBULATORY_CARE_PROVIDER_SITE_OTHER): Payer: Self-pay | Admitting: Family Medicine

## 2024-06-28 ENCOUNTER — Ambulatory Visit (INDEPENDENT_AMBULATORY_CARE_PROVIDER_SITE_OTHER): Admitting: Family Medicine

## 2024-06-28 VITALS — BP 112/76 | HR 86 | Temp 98.5°F | Ht 73.0 in | Wt 306.0 lb

## 2024-06-28 DIAGNOSIS — E785 Hyperlipidemia, unspecified: Secondary | ICD-10-CM | POA: Diagnosis not present

## 2024-06-28 DIAGNOSIS — Z6841 Body Mass Index (BMI) 40.0 and over, adult: Secondary | ICD-10-CM

## 2024-06-28 DIAGNOSIS — E669 Obesity, unspecified: Secondary | ICD-10-CM

## 2024-06-28 DIAGNOSIS — E559 Vitamin D deficiency, unspecified: Secondary | ICD-10-CM

## 2024-06-28 DIAGNOSIS — E1169 Type 2 diabetes mellitus with other specified complication: Secondary | ICD-10-CM | POA: Diagnosis not present

## 2024-06-28 DIAGNOSIS — E1165 Type 2 diabetes mellitus with hyperglycemia: Secondary | ICD-10-CM

## 2024-06-28 DIAGNOSIS — Z7985 Long-term (current) use of injectable non-insulin antidiabetic drugs: Secondary | ICD-10-CM

## 2024-06-28 DIAGNOSIS — D509 Iron deficiency anemia, unspecified: Secondary | ICD-10-CM

## 2024-06-28 MED ORDER — VITAMIN D (ERGOCALCIFEROL) 1.25 MG (50000 UNIT) PO CAPS: 50000.0000 [IU] | ORAL_CAPSULE | ORAL | 0 refills | Status: DC

## 2024-06-28 MED ORDER — PRENATAL MULTIVITAMIN CH
1.0000 | ORAL_TABLET | Freq: Every day | ORAL | Status: AC
Start: 2024-06-28 — End: ?

## 2024-06-28 MED ORDER — OZEMPIC (0.25 OR 0.5 MG/DOSE) 2 MG/3ML ~~LOC~~ SOPN: 0.5000 mg | PEN_INJECTOR | SUBCUTANEOUS | 0 refills | Status: DC

## 2024-06-28 NOTE — Progress Notes (Unsigned)
 SUBJECTIVE:  Chief Complaint: Obesity  Interim History: Patient here for follow up.  See saw Dr. Sheree and his scale said she was 317.  She is still be evaluating for her varicose veins.  Since our last appointment she mentions the last three weeks she hasn't been cooking much so she been compliant with her meal plan.  When she is working she is on plan.  At dinner she is less compliant.  This Friday she is celebrating her friends birthday in [place].   Candice Kane is here to discuss her progress with her obesity treatment plan. She is on the Category 3 Plan and states she is following her eating plan approximately 50 % of the time. She states she is exercising 15 minutes 2 times per week.   OBJECTIVE: Visit Diagnoses: Problem List Items Addressed This Visit   None   Vitals Temp: 98.5 F (36.9 C) BP: 112/76 Pulse Rate: 86 SpO2: 99 %   Anthropometric Measurements Height: 6' 1 (1.854 m) Weight: (!) 306 lb (138.8 kg) BMI (Calculated): 40.38 Weight at Last Visit: 312 lb Weight Lost Since Last Visit: 6 Weight Gained Since Last Visit: 0 Starting Weight: 326 lb Total Weight Loss (lbs): 20 lb (9.072 kg)   Body Composition  Body Fat %: 50.4 % Fat Mass (lbs): 154.6 lbs Muscle Mass (lbs): 144.4 lbs Total Body Water (lbs): 118 lbs Visceral Fat Rating : 13   Other Clinical Data Today's Visit #: 11 Starting Date: 10/01/23 Comments: Cat 3     ASSESSMENT AND PLAN: Assessment & Plan Vitamin D  deficiency Discussed recent labs with patient.  Vitamin D  level still low regardless of patient on prescription strength vitamin D .  Needs a refill of prescription strength vitamin D  today.  No nausea, vomiting, or muscle weakness mention. Type 2 diabetes mellitus with hyperglycemia, without long-term current use of insulin  (HCC) Patient on ozempic  with fairly good control of intake.  She needs a refill today.  No significant GI side effects mentioned.  Refill sent to  pharmacy. Hyperlipidemia associated with type 2 diabetes mellitus (HCC) Patient not on medication but LDL still twice goal level.  Patient trying to limit saturated fat to less than 20% of total intake.  Will need repeat lab in 3 months. Microcytic anemia Still low on H&H and MCV.  Patient previously on iron  supplementation but no longer taking.  She was encouraged to start a prenatal vitamin and we will reassess complete blood count in 90 days.  If blood counts still low at that time we will send to hematology Obesity with starting BMI of 43.1  BMI 40.0-44.9, adult (HCC)    Diet: Candice Kane is currently in the action stage of change. As such, her goal is to continue with weight loss efforts and has agreed to the Category 3 Plan.   Exercise:  For substantial health benefits, adults should do at least 150 minutes (2 hours and 30 minutes) a week of moderate-intensity, or 75 minutes (1 hour and 15 minutes) a week of vigorous-intensity aerobic physical activity, or an equivalent combination of moderate- and vigorous-intensity aerobic activity. Aerobic activity should be performed in episodes of at least 10 minutes, and preferably, it should be spread throughout the week.  Behavior Modification:  We discussed the following Behavioral Modification Strategies today: increasing lean protein intake, decreasing simple carbohydrates, increasing vegetables, meal planning and cooking strategies, and planning for success.   No follow-ups on file.   She was informed of the importance of frequent follow up  visits to maximize her success with intensive lifestyle modifications for her multiple health conditions.  Attestation Statements:   Reviewed by clinician on day of visit: allergies, medications, problem list, medical history, surgical history, family history, social history, and previous encounter notes.   Candice Cho, MD

## 2024-06-30 NOTE — Assessment & Plan Note (Addendum)
 Still low on H&H and MCV.  Patient previously on iron  supplementation but no longer taking.  She was encouraged to start a prenatal vitamin and we will reassess complete blood count in 90 days.  If blood counts still low at that time we will send to hematology

## 2024-06-30 NOTE — Assessment & Plan Note (Addendum)
 Patient on ozempic  with fairly good control of intake.  She needs a refill today.  No significant GI side effects mentioned.  Refill sent to pharmacy.

## 2024-06-30 NOTE — Assessment & Plan Note (Addendum)
 Discussed recent labs with patient.  Vitamin D  level still low regardless of patient on prescription strength vitamin D .  Needs a refill of prescription strength vitamin D  today.  No nausea, vomiting, or muscle weakness mention.

## 2024-06-30 NOTE — Assessment & Plan Note (Addendum)
 Patient not on medication but LDL still twice goal level.  Patient trying to limit saturated fat to less than 20% of total intake.  Will need repeat lab in 3 months.

## 2024-07-13 ENCOUNTER — Other Ambulatory Visit: Payer: Self-pay | Admitting: *Deleted

## 2024-07-13 DIAGNOSIS — I83891 Varicose veins of right lower extremities with other complications: Secondary | ICD-10-CM

## 2024-07-26 ENCOUNTER — Encounter (INDEPENDENT_AMBULATORY_CARE_PROVIDER_SITE_OTHER): Payer: Self-pay | Admitting: Family Medicine

## 2024-07-26 ENCOUNTER — Ambulatory Visit (INDEPENDENT_AMBULATORY_CARE_PROVIDER_SITE_OTHER): Admitting: Family Medicine

## 2024-07-26 VITALS — BP 91/65 | HR 66 | Temp 98.1°F | Ht 73.0 in | Wt 297.0 lb

## 2024-07-26 DIAGNOSIS — Z6839 Body mass index (BMI) 39.0-39.9, adult: Secondary | ICD-10-CM

## 2024-07-26 DIAGNOSIS — E669 Obesity, unspecified: Secondary | ICD-10-CM

## 2024-07-26 DIAGNOSIS — E1165 Type 2 diabetes mellitus with hyperglycemia: Secondary | ICD-10-CM | POA: Diagnosis not present

## 2024-07-26 DIAGNOSIS — Z7985 Long-term (current) use of injectable non-insulin antidiabetic drugs: Secondary | ICD-10-CM

## 2024-07-26 DIAGNOSIS — D509 Iron deficiency anemia, unspecified: Secondary | ICD-10-CM | POA: Diagnosis not present

## 2024-07-26 MED ORDER — OZEMPIC (0.25 OR 0.5 MG/DOSE) 2 MG/3ML ~~LOC~~ SOPN
0.5000 mg | PEN_INJECTOR | SUBCUTANEOUS | 0 refills | Status: DC
Start: 2024-07-26 — End: 2024-09-01

## 2024-07-26 NOTE — Progress Notes (Signed)
 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  She has been decreasing her red meat intake over the last 5 weeks.  She is hoping to see a decrease in her cholesterol.  She and her son have been doing some walking in the parking lot and she has been riding a stationary bikes for a few nights a week.  She is still focusing on eating high protein foods.  She drank more soda in the last two weeks.  Does feel like she can manage her intake of soda over the next month.   Has surgery scheduled October 16th for her leg veins- down time is 1 week.   Candice Kane is here to discuss her progress with her obesity treatment plan. She is on the Category 3 Plan and states she is following her eating plan approximately 85 % of the time. She states she is exercising 30 minutes 3 times per week.   OBJECTIVE: Visit Diagnoses: Problem List Items Addressed This Visit       Endocrine   Type 2 diabetes mellitus with hyperglycemia (HCC) - Primary   Relevant Medications   Semaglutide ,0.25 or 0.5MG /DOS, (OZEMPIC , 0.25 OR 0.5 MG/DOSE,) 2 MG/3ML SOPN     Other   Microcytic anemia   Morbid obesity (HCC)   Relevant Medications   Semaglutide ,0.25 or 0.5MG /DOS, (OZEMPIC , 0.25 OR 0.5 MG/DOSE,) 2 MG/3ML SOPN   Other Visit Diagnoses       BMI 39.0-39.9,adult           Vitals Temp: 98.1 F (36.7 C) BP: 91/65 Pulse Rate: 66   Anthropometric Measurements Height: 6' 1 (1.854 m) Weight: 297 lb (134.7 kg) BMI (Calculated): 39.19 Weight at Last Visit: 306 lb Weight Lost Since Last Visit: 9 lb Weight Gained Since Last Visit: 0 Starting Weight: 326 lb Total Weight Loss (lbs): 29 lb (13.2 kg)   Body Composition  Body Fat %: 49.1 % Fat Mass (lbs): 146.2 lbs Muscle Mass (lbs): 143.8 lbs Total Body Water (lbs): 112.8 lbs Visceral Fat Rating : 13   Other Clinical Data Today's Visit #: 12 Starting Date:  10/01/23 Comments: Cat 3     ASSESSMENT AND PLAN: Assessment & Plan Type 2 diabetes mellitus with hyperglycemia, without long-term current use of insulin  (HCC) Patient reports an increase in protein intake but also an increase in soda intake over the last few weeks.  She needs a refill of her Ozempic  today.  We discussed possibly going up on medication at this time but patient would like to see if getting more aligned with meal plan would not require medication increase.  Will follow-up in 1 month to assess patient's response to the 0.5 mg dose and further discussed possibly going up to the 1 mg dose if necessary. Microcytic anemia Labs done from May 25, 2024 showing a low iron  level with a low iron  saturation but a ferritin level at goal.  H&H low and MCV low.  Patient reports a history of thalassemia trait.  She is taking a prenatal vitamin.  Follow-up on repeat labs in November. Obesity with starting BMI of 43.1  BMI 39.0-39.9,adult    Diet: Logan is currently in the action stage of change. As such, her goal is to continue with weight loss efforts and has agreed to the Category 3 Plan.   Exercise:  For substantial health benefits, adults should do at least 150 minutes (2 hours and 30  minutes) a week of moderate-intensity, or 75 minutes (1 hour and 15 minutes) a week of vigorous-intensity aerobic physical activity, or an equivalent combination of moderate- and vigorous-intensity aerobic activity. Aerobic activity should be performed in episodes of at least 10 minutes, and preferably, it should be spread throughout the week.  Behavior Modification:  We discussed the following Behavioral Modification Strategies today: increasing lean protein intake, meal planning and cooking strategies, keeping healthy foods in the home, and celebration eating strategies.   Return in about 5 weeks (around 08/30/2024).   She was informed of the importance of frequent follow up visits to maximize her  success with intensive lifestyle modifications for her multiple health conditions.  Attestation Statements:   Reviewed by clinician on day of visit: allergies, medications, problem list, medical history, surgical history, family history, social history, and previous encounter notes.     [name], MD

## 2024-08-01 NOTE — Assessment & Plan Note (Signed)
 Labs done from May 25, 2024 showing a low iron  level with a low iron  saturation but a ferritin level at goal.  H&H low and MCV low.  Patient reports a history of thalassemia trait.  She is taking a prenatal vitamin.  Follow-up on repeat labs in November.

## 2024-08-01 NOTE — Assessment & Plan Note (Signed)
 Patient reports an increase in protein intake but also an increase in soda intake over the last few weeks.  She needs a refill of her Ozempic  today.  We discussed possibly going up on medication at this time but patient would like to see if getting more aligned with meal plan would not require medication increase.  Will follow-up in 1 month to assess patient's response to the 0.5 mg dose and further discussed possibly going up to the 1 mg dose if necessary.

## 2024-09-01 ENCOUNTER — Encounter (INDEPENDENT_AMBULATORY_CARE_PROVIDER_SITE_OTHER): Payer: Self-pay | Admitting: Family Medicine

## 2024-09-01 ENCOUNTER — Ambulatory Visit (INDEPENDENT_AMBULATORY_CARE_PROVIDER_SITE_OTHER): Admitting: Family Medicine

## 2024-09-01 VITALS — BP 108/70 | HR 68 | Temp 97.8°F | Ht 73.0 in | Wt 301.0 lb

## 2024-09-01 DIAGNOSIS — D509 Iron deficiency anemia, unspecified: Secondary | ICD-10-CM

## 2024-09-01 DIAGNOSIS — E669 Obesity, unspecified: Secondary | ICD-10-CM | POA: Diagnosis not present

## 2024-09-01 DIAGNOSIS — Z7985 Long-term (current) use of injectable non-insulin antidiabetic drugs: Secondary | ICD-10-CM

## 2024-09-01 DIAGNOSIS — E559 Vitamin D deficiency, unspecified: Secondary | ICD-10-CM

## 2024-09-01 DIAGNOSIS — E1165 Type 2 diabetes mellitus with hyperglycemia: Secondary | ICD-10-CM | POA: Diagnosis not present

## 2024-09-01 DIAGNOSIS — Z6839 Body mass index (BMI) 39.0-39.9, adult: Secondary | ICD-10-CM

## 2024-09-01 MED ORDER — VITAMIN D (ERGOCALCIFEROL) 1.25 MG (50000 UNIT) PO CAPS
50000.0000 [IU] | ORAL_CAPSULE | ORAL | 0 refills | Status: DC
Start: 2024-09-01 — End: 2024-10-06

## 2024-09-01 MED ORDER — OZEMPIC (0.25 OR 0.5 MG/DOSE) 2 MG/3ML ~~LOC~~ SOPN: 0.5000 mg | PEN_INJECTOR | SUBCUTANEOUS | 0 refills | Status: DC

## 2024-09-01 NOTE — Assessment & Plan Note (Signed)
 Tolerating current dose of Ozempic  at 0.5 mg weekly.  She denies any side effects but is still experiencing better control in terms of food intake and food choices with this medication.  Will refill Ozempic  at current dose.

## 2024-09-01 NOTE — Assessment & Plan Note (Signed)
 Patient doing well at remembering to take vitamin D  more frequently.  Needs a refill of prescription strength vitamin D  today.  No nausea, vomiting, or muscle weakness reported.

## 2024-09-01 NOTE — Assessment & Plan Note (Signed)
 Patient reports she has not been taking prenatal vitamin as she thought that her vitamin D  was what she was ultimately taking her multivitamin for.  She will restart prenatal vitamin and we will repeat labs at next appointment to assess whether or not patient may need to go to hematology for further iron  supplementation.

## 2024-09-01 NOTE — Progress Notes (Signed)
 SUBJECTIVE:  Chief Complaint: Obesity  Interim History: Patient has been consistent with her exercise.  She wasn't as on plan with her intake as she knows she needed to be.  She celebrated her birthday and her son celebrated his birthday and they went to [facility] and ate out while there.  The next month she has upcoming surgery on October 16th that will leave her limited for 1 week.  She otherwise is not expecting much when it comes to events or activities.  She mentions the category plan will be the easiest to stay consistent with.   Candice Kane is here to discuss her progress with her obesity treatment plan. She is on the Category 3 Plan and states she is following her eating plan approximately 60 % of the time. She states she is exercising 45 minutes 4 times per week.   OBJECTIVE: Visit Diagnoses: Problem List Items Addressed This Visit       Endocrine   Type 2 diabetes mellitus with hyperglycemia (HCC)   Relevant Medications   Semaglutide ,0.25 or 0.5MG /DOS, (OZEMPIC , 0.25 OR 0.5 MG/DOSE,) 2 MG/3ML SOPN     Other   Microcytic anemia - Primary   Vitamin D  deficiency   Relevant Medications   Vitamin D , Ergocalciferol , (DRISDOL ) 1.25 MG (50000 UNIT) CAPS capsule   Morbid obesity (HCC)   Relevant Medications   Semaglutide ,0.25 or 0.5MG /DOS, (OZEMPIC , 0.25 OR 0.5 MG/DOSE,) 2 MG/3ML SOPN   Other Visit Diagnoses       BMI 39.0-39.9,adult           Vitals Temp: 97.8 F (36.6 C) BP: 108/70 Pulse Rate: 68 SpO2: 99 %   Anthropometric Measurements Height: 6' 1 (1.854 m) Weight: (!) 301 lb (136.5 kg) BMI (Calculated): 39.72 Weight at Last Visit: 297 lb Weight Lost Since Last Visit: 0 Weight Gained Since Last Visit: 4 Starting Weight: 326 lb Total Weight Loss (lbs): 25 lb (11.3 kg)   Body Composition  Body Fat %: 50 % Fat Mass (lbs): 150.8 lbs Muscle Mass (lbs): 143 lbs Total Body Water (lbs): 114.6 lbs Visceral Fat Rating : 13   Other Clinical  Data Fasting: yes Labs: no Today's Visit #: 13 Starting Date: 10/01/23 Comments: Cat 3     ASSESSMENT AND PLAN: Assessment & Plan Microcytic anemia Patient reports she has not been taking prenatal vitamin as she thought that her vitamin D  was what she was ultimately taking her multivitamin for.  She will restart prenatal vitamin and we will repeat labs at next appointment to assess whether or not patient may need to go to hematology for further iron  supplementation. Type 2 diabetes mellitus with hyperglycemia, without long-term current use of insulin  (HCC) Tolerating current dose of Ozempic  at 0.5 mg weekly.  She denies any side effects but is still experiencing better control in terms of food intake and food choices with this medication.  Will refill Ozempic  at current dose. Vitamin D  deficiency Patient doing well at remembering to take vitamin D  more frequently.  Needs a refill of prescription strength vitamin D  today.  No nausea, vomiting, or muscle weakness reported. Obesity with starting BMI of 43.1  BMI 39.0-39.9,adult    Diet: Consetta is currently in the action stage of change. As such, her goal is to continue with weight loss efforts and has agreed to the Category 3 Plan.   Exercise:  For substantial health benefits, adults should do at least 150 minutes (2 hours and 30 minutes) a week of moderate-intensity, or 75  minutes (1 hour and 15 minutes) a week of vigorous-intensity aerobic physical activity, or an equivalent combination of moderate- and vigorous-intensity aerobic activity. Aerobic activity should be performed in episodes of at least 10 minutes, and preferably, it should be spread throughout the week.  Behavior Modification:  We discussed the following Behavioral Modification Strategies today: increasing lean protein intake, decreasing simple carbohydrates, increasing vegetables, meal planning and cooking strategies, and planning for success.   Return in about 5  weeks (around 10/06/2024).   She was informed of the importance of frequent follow up visits to maximize her success with intensive lifestyle modifications for her multiple health conditions.  Attestation Statements:   Reviewed by clinician on day of visit: allergies, medications, problem list, medical history, surgical history, family history, social history, and previous encounter notes.   [name], MD

## 2024-09-07 ENCOUNTER — Other Ambulatory Visit: Payer: Self-pay | Admitting: *Deleted

## 2024-09-07 ENCOUNTER — Encounter: Payer: Self-pay | Admitting: *Deleted

## 2024-09-07 MED ORDER — LORAZEPAM 1 MG PO TABS: ORAL_TABLET | ORAL | 0 refills | Status: DC

## 2024-09-15 ENCOUNTER — Encounter: Payer: Self-pay | Admitting: Vascular Surgery

## 2024-09-15 ENCOUNTER — Ambulatory Visit: Attending: Vascular Surgery | Admitting: Vascular Surgery

## 2024-09-15 VITALS — BP 107/77 | HR 80 | Temp 97.7°F | Resp 16 | Ht 73.0 in | Wt 297.0 lb

## 2024-09-15 DIAGNOSIS — I83891 Varicose veins of right lower extremities with other complications: Secondary | ICD-10-CM | POA: Diagnosis not present

## 2024-09-15 HISTORY — PX: LASER ABLATION: SHX1947

## 2024-09-15 NOTE — Progress Notes (Signed)
 Patient name: Candice Kane MRN: [id number] DOB: 09/24/1986 Sex: female  REASON FOR VISIT: Treatment of right lower extremity chronic venous insufficiency with symptomatic varicose veins and swelling  HPI: Candice Kane is a 38 y.o. 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  She has been compliant with thigh-high compression stockings and presents today for treatment including saphenous vein ablation and stab phlebectomy of greater than 20.  Current Outpatient Medications  Medication Sig Dispense Refill   ibuprofen  (ADVIL ) 600 MG tablet Take 1 tablet (600 mg total) by mouth every 6 (six) hours. 30 tablet 0   Insulin  Pen Needle (BD PEN NEEDLE NANO 2ND GEN) 32G X 4 MM MISC 1 Package by Does not apply route 2 (two) times daily. 100 each 0   LORazepam (ATIVAN) 1 MG tablet Take 2 tablets 30 to 60 minutes prior to leaving house on day of office surgery.  Bring third tablet with you to office on day of office surgery. 3 tablet 0   Prenatal Vit-Fe Fumarate-FA (PRENATAL MULTIVITAMIN) TABS tablet Take 1 tablet by mouth daily at 12 noon.     Semaglutide ,0.25 or 0.5MG /DOS, (OZEMPIC , 0.25 OR 0.5 MG/DOSE,) 2 MG/3ML SOPN Inject 0.5 mg into the skin once a week. 3 mL 0   VENTOLIN  HFA 108 (90 Base) MCG/ACT inhaler Inhale into the lungs.     Vitamin D , Ergocalciferol , (DRISDOL ) 1.25 MG (50000 UNIT) CAPS capsule Take 1 capsule (50,000 Units total) by mouth every 7 (seven) days. 12 capsule 0   No current facility-administered medications for this visit.    PHYSICAL EXAM: Vitals:   09/15/24 1118  BP: 107/77  Pulse: 80  Resp: 16  Temp: 97.7 F (36.5 C)  SpO2: 97%   Awake alert and oriented Nonlabored respirations Right lower extremity varicosities marked with the patient in the standing position  PROCEDURE: Right greater saphenous vein ablation totaling 19 cm and stab phlebectomy greater than 20 sites right lateral and medial thigh and right lateral and medial  leg  TECHNIQUE: Candice Kane was taken the procedure room where she was initially evaluated in the standing position and varicosities of the right lower extremity were marked.  She was then laid supine and sterilely prepped and draped in the right lower extremity in a circumferential fashion.  The great saphenous vein was identified just above the knee and the area was anesthetized and the vein was cannulated with a micropuncture needle followed by wire and a sheath.  A J-wire was then placed 3 cm shy of the saphenofemoral junction and the laser reducer sheath was then placed until the J-wire straightened.  The laser was then introduced.  Tumescent anesthesia was instilled along the great saphenous vein.  The vein was then ablated for a total of 19 cm and at completion the common femoral vein was noted to be patent and compressible.  Attention was then turned to the multiple areas of varicosities.  Local anesthesia was instilled with 1% lidocaine  and tumescent anesthesia followed.  We then made greater than 20 stab microphlebectomy sites of the medial and lateral thigh and leg and the veins were removed using combination of crochet hook and hemostat.  At completion Steri-Strips were placed and a sterile compressive dressing was applied.  She tolerated the procedure well intermittent complication.  Plan will be for follow-up in 2 weeks with postablation duplex and we can also discuss treatment of left lower extremity symptomatic varicosities.  [place] [facility] [phone]

## 2024-09-15 NOTE — Progress Notes (Signed)
     Laser Ablation Procedure    Date: 09/15/2024 Candice Kane DOB:Jan 03, 1986 Consent signed: Yes     Surgeon: Armando [place] Procedure: Laser Ablation: right Greater Saphenous Vein BP 107/77 (BP Location: Left Arm, Patient Position: Sitting, Cuff Size: Normal)   Pulse 80   Temp 97.7 F (36.5 C) (Temporal)   Resp 16   Ht 6' 1 (1.854 m)   Wt 297 lb (134.7 kg)   SpO2 97%   BMI 39.18 kg/m   Tumescent Anesthesia: 700 cc 0.9% NaCl with 50 cc Lidocaine  HCL 1%  and 15 cc 8.4% NaHCO3 Local Anesthesia: 15 cc Lidocaine  HCL and NaHCO3 (ratio 2:1) 7 watts continuous mode    Total energy: 1456.5 joules    Total time: 208 seconds Treatment Length 29 cm Laser Fiber Ref. # [id number]      Lot # [id number] Stab Phlebectomy: >20 Sites: Thigh and Calf  Patient tolerated procedure well  Notes:  All staff members wore facial masks. Pt had 2 tables of ativan at 09:15 am and another 1 mg of ativan at 10:30 am, Making a total of 3 mg of ativan prior to the surgical procedure   Description of Procedure:  After marking the course of the secondary varicosities, the patient was placed on the operating table in the supine position, and the right leg was prepped and draped in sterile fashion.   Local anesthetic was administered and under ultrasound guidance the saphenous vein was accessed with a micro needle and guide wire; then the mirco puncture sheath was placed.  A guide wire was inserted saphenofemoral junction , followed by a 5 french sheath.  The position of the sheath and then the laser fiber below the junction was confirmed using the ultrasound.  Tumescent anesthesia was administered along the course of the saphenous vein using ultrasound guidance. The patient was placed in Trendelenburg position and protective laser glasses were placed on patient and staff, and the laser was fired at 7 watts continuous mode for a total of 1456.5 joules.  For stab phlebectomies, local anesthetic was administered at the  previously marked varicosities, and tumescent anesthesia was administered around the vessels.  Greater than 20 stab wounds were made using the tip of an 11 blade. And using the vein hook, the phlebectomies were performed using a hemostat to avulse the varicosities.  Adequate hemostasis was achieved.   Steri strips were applied to the stab wounds and ABD pads and thigh high compression stockings were applied.  Ace wrap bandages were applied over the phlebectomy sites and at the top of the saphenofemoral junction. Blood loss was less than 15 cc.  Discharge instructions reviewed with patient and hardcopy of discharge instructions given to patient to take home. The patient was wheeled out of the operating room having tolerated the procedure well.

## 2024-09-29 ENCOUNTER — Ambulatory Visit (HOSPITAL_COMMUNITY)
Admission: RE | Admit: 2024-09-29 | Discharge: 2024-09-29 | Disposition: A | Discharge status: Home / Self Care | Source: Ambulatory Visit | Attending: Vascular Surgery | Admitting: Vascular Surgery

## 2024-09-29 ENCOUNTER — Ambulatory Visit (INDEPENDENT_AMBULATORY_CARE_PROVIDER_SITE_OTHER): Admitting: Vascular Surgery

## 2024-09-29 VITALS — BP 128/74 | HR 79 | Temp 97.8°F

## 2024-09-29 DIAGNOSIS — I83891 Varicose veins of right lower extremities with other complications: Secondary | ICD-10-CM | POA: Diagnosis not present

## 2024-09-29 NOTE — Progress Notes (Signed)
     Subjective:     Patient ID: Candice Kane, female   DOB: 18-Dec-1985, 38 y.o.   MRN: [id number]  HPI 38 year old female status post right greater saphenous vein ablation and stab phlebectomy of multiple varicosities.  She states that her leg is pain-free but she does have some knots around the phlebectomy sites that she is otherwise recovering well and continues with thigh-high compression stockings bilaterally.   Review of Systems As above    Objective:   Physical Exam Vitals:   09/29/24 1031  BP: 128/74  Pulse: 79  Temp: 97.8 F (36.6 C)  SpO2: 98%     Venous Reflux Times  +---------+---------+------+-----------+------------+--------+  RIGHT   Reflux NoRefluxReflux TimeDiameter cmsComments                     Yes                                   +---------+---------+------+-----------+------------+--------+  CFV                                           Patent    +---------+---------+------+-----------+------------+--------+  FV prox                                        Patent    +---------+---------+------+-----------+------------+--------+  FV mid                                         Patent    +---------+---------+------+-----------+------------+--------+  FV dist                                        Patent    +---------+---------+------+-----------+------------+--------+  Popliteal                                     Patent    +---------+---------+------+-----------+------------+--------+         Summary:  Right:  - No evidence of deep vein thrombosis seen in the right lower extremity,  from the common femoral through the popliteal veins.  - Successful GSV ablation from the distal thigh to within 3.02cm of the  SFJ.     Assessment:     38 year old female status post satisfactory ablation of her great saphenous vein and stab phlebectomy of multiple varicosities now recovering well.    Plan:      Continue bilateral thigh-high compression stockings  She will call to follow-up for evaluation of left lower extremity symptomatic varicosities and will need reflux testing prior to evaluation.     Atha Mcbain C. Sheree, MD [facility] Office: [phone] Pager: [phone]

## 2024-10-06 ENCOUNTER — Ambulatory Visit (INDEPENDENT_AMBULATORY_CARE_PROVIDER_SITE_OTHER): Payer: Self-pay | Admitting: Family Medicine

## 2024-10-06 VITALS — BP 120/77 | HR 87 | Temp 98.1°F | Ht 73.0 in | Wt 289.0 lb

## 2024-10-06 DIAGNOSIS — E669 Obesity, unspecified: Secondary | ICD-10-CM

## 2024-10-06 DIAGNOSIS — E1169 Type 2 diabetes mellitus with other specified complication: Secondary | ICD-10-CM | POA: Diagnosis not present

## 2024-10-06 DIAGNOSIS — E1165 Type 2 diabetes mellitus with hyperglycemia: Secondary | ICD-10-CM

## 2024-10-06 DIAGNOSIS — E785 Hyperlipidemia, unspecified: Secondary | ICD-10-CM | POA: Diagnosis not present

## 2024-10-06 DIAGNOSIS — Z6838 Body mass index (BMI) 38.0-38.9, adult: Secondary | ICD-10-CM

## 2024-10-06 DIAGNOSIS — Z7985 Long-term (current) use of injectable non-insulin antidiabetic drugs: Secondary | ICD-10-CM

## 2024-10-06 DIAGNOSIS — E559 Vitamin D deficiency, unspecified: Secondary | ICD-10-CM

## 2024-10-06 MED ORDER — VITAMIN D (ERGOCALCIFEROL) 1.25 MG (50000 UNIT) PO CAPS: 50000.0000 [IU] | ORAL_CAPSULE | ORAL | 0 refills | Status: DC

## 2024-10-06 MED ORDER — OZEMPIC (0.25 OR 0.5 MG/DOSE) 2 MG/3ML ~~LOC~~ SOPN: 0.5000 mg | PEN_INJECTOR | SUBCUTANEOUS | 1 refills | Status: DC

## 2024-10-06 NOTE — Progress Notes (Signed)
 SUBJECTIVE:  Chief Complaint: Obesity  Interim History: Patient has been following category meal plan more consistently since last appointment.  She got venous ablation on October 16th- bit nervous day of but overall everything went well.  She is contemplating going to her uncle's house for Thanksgiving but isn't sure if she will go.  Over the next 4-5 weeks she doesn't have much planned aside from working.    Candice Kane is here to discuss her progress with her obesity treatment plan. She is on the Category 3 Plan and states she is following her eating plan approximately 90 % of the time. She states she is not exercising as much.   OBJECTIVE: Visit Diagnoses: Problem List Items Addressed This Visit       Endocrine   Hyperlipidemia associated with type 2 diabetes mellitus (HCC) - Primary   Relevant Medications   Semaglutide ,0.25 or 0.5MG /DOS, (OZEMPIC , 0.25 OR 0.5 MG/DOSE,) 2 MG/3ML SOPN   Type 2 diabetes mellitus with hyperglycemia (HCC)   Relevant Medications   Semaglutide ,0.25 or 0.5MG /DOS, (OZEMPIC , 0.25 OR 0.5 MG/DOSE,) 2 MG/3ML SOPN     Other   Vitamin D  deficiency   Relevant Medications   Vitamin D , Ergocalciferol , (DRISDOL ) 1.25 MG (50000 UNIT) CAPS capsule   Morbid obesity (HCC)   Relevant Medications   Semaglutide ,0.25 or 0.5MG /DOS, (OZEMPIC , 0.25 OR 0.5 MG/DOSE,) 2 MG/3ML SOPN   Other Visit Diagnoses       BMI 38.0-38.9,adult           Vitals Temp: (!) 97.3 F (36.3 C) BP: 124/68 Pulse Rate: -- (unable to obtain due to nails.) SpO2: -- (unable to obtain due to nails)     ASSESSMENT AND PLAN: Assessment & Plan Hyperlipidemia associated with type 2 diabetes mellitus (HCC) Continues to work on limiting saturated fat intake daily.  Focusing on protein intake.  Not currently on medication for cholesterol management.  Needs repeat labs in the next 1 to 2 months. Type 2 diabetes mellitus with hyperglycemia, without long-term current use of insulin   (HCC) Tolerating semaglutide  at current dose.  No changes in treatment plan at this time.  Needs refill of semaglutide . Vitamin D  deficiency Needs refill of prescription strength vitamin D  at this time.  No nausea, vomiting, or muscle weakness reported. Obesity with starting BMI of 43.1 Other Clinical Data Today's Visit #: 14 Starting Date: 10/01/23 Comments: Cat 3 Anthropometric Measurements Height: 6' 1 (1.854 m) Weight: 289 lb (131.1 kg) BMI (Calculated): 38.14 Weight at Last Visit: 301 lb Weight Lost Since Last Visit: 12 lb Weight Gained Since Last Visit: 0 Starting Weight: 326 lb Total Weight Loss (lbs): 37 lb (16.8 kg)  BMI 38.0-38.9,adult    Diet: Candice Kane is currently in the action stage of change. As such, her goal is to continue with weight loss efforts and has agreed to the Category 3 Plan.   Exercise:  For substantial health benefits, adults should do at least 150 minutes (2 hours and 30 minutes) a week of moderate-intensity, or 75 minutes (1 hour and 15 minutes) a week of vigorous-intensity aerobic physical activity, or an equivalent combination of moderate- and vigorous-intensity aerobic activity. Aerobic activity should be performed in episodes of at least 10 minutes, and preferably, it should be spread throughout the week.  Behavior Modification:  We discussed the following Behavioral Modification Strategies today: increasing lean protein intake, decreasing simple carbohydrates, increasing vegetables, meal planning and cooking strategies, and planning for success.   Return in about 5 weeks (around 11/10/2024).  She was informed of the importance of frequent follow up visits to maximize her success with intensive lifestyle modifications for her multiple health conditions.  Attestation Statements:   Reviewed by clinician on day of visit: allergies, medications, problem list, medical history, surgical history, family history, social history, and previous encounter  notes.     Candice Cho, MD

## 2024-10-13 ENCOUNTER — Encounter: Payer: Self-pay | Admitting: Family Medicine

## 2024-10-13 ENCOUNTER — Ambulatory Visit: Payer: Self-pay

## 2024-10-13 ENCOUNTER — Ambulatory Visit (INDEPENDENT_AMBULATORY_CARE_PROVIDER_SITE_OTHER): Admitting: Family Medicine

## 2024-10-13 VITALS — BP 124/68 | Temp 97.3°F | Ht 73.0 in | Wt 294.4 lb

## 2024-10-13 DIAGNOSIS — M542 Cervicalgia: Secondary | ICD-10-CM | POA: Diagnosis not present

## 2024-10-13 DIAGNOSIS — M25511 Pain in right shoulder: Secondary | ICD-10-CM | POA: Diagnosis not present

## 2024-10-13 MED ORDER — MELOXICAM 15 MG PO TABS: 15.0000 mg | ORAL_TABLET | Freq: Every day | ORAL | 0 refills | Status: DC

## 2024-10-13 NOTE — Progress Notes (Signed)
 Phone [phone] In person visit   Subjective:   Candice Kane is a 38 y.o. year old very pleasant female patient who presents for/with See problem oriented charting Chief Complaint  Patient presents with   Shoulder Pain    Shoulder pain starter about two weeks ago. Wakes her up. Pain causes her to be unable raise shoulder and sometimes it shoots down her arm. Was using Motrin  but doesn't seem to be helping anymore. No known injury. Pain 0-10 is 10. Heat in the shower does help.   Patient needs to schedule annual before leaving. Last seen 08/2023.     Past Medical History-  Patient Active Problem List   Diagnosis Date Noted   Morbid obesity (HCC) 06/04/2024   Microcytic anemia 10/15/2023   Hyperlipidemia associated with type 2 diabetes mellitus (HCC) 10/15/2023   Vitamin D  deficiency 10/15/2023   Abnormal TSH 10/15/2023   Type 2 diabetes mellitus with hyperglycemia (HCC) 10/15/2023   Polyphagia 08/17/2023   History of gestational diabetes 02/10/2023   Alpha thalassemia silent carrier 06/15/2022   Obesity, Class III, BMI 40-49.9 (morbid obesity) (HCC) 06/05/2022   Varicose 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 complications 03/10/2013   Lipoma of back 03/10/2013    Medications- reviewed and updated Current Outpatient Medications  Medication Sig Dispense Refill   Insulin  Pen Needle (BD PEN NEEDLE NANO 2ND GEN) 32G X 4 MM MISC 1 Package by Does not apply route 2 (two) times daily. 100 each 0   meloxicam  (MOBIC ) 15 MG tablet Take 1 tablet (15 mg total) by mouth daily. 10 tablet 0   Prenatal Vit-Fe Fumarate-FA (PRENATAL MULTIVITAMIN) TABS tablet Take 1 tablet by mouth daily at 12 noon.     Semaglutide ,0.25 or 0.5MG /DOS, (OZEMPIC , 0.25 OR 0.5 MG/DOSE,) 2 MG/3ML SOPN Inject 0.5 mg into the skin once a week. 3 mL 1   VENTOLIN  HFA 108 (90 Base) MCG/ACT inhaler Inhale into the lungs.     Vitamin D , Ergocalciferol , (DRISDOL ) 1.25 MG (50000 UNIT) CAPS capsule Take 1 capsule  (50,000 Units total) by mouth every 7 (seven) days. 12 capsule 0   No current facility-administered medications for this visit.     Objective:  BP 124/68   Temp (!) 97.3 F (36.3 C) (Temporal)   Ht 6' 1 (1.854 m)   Wt 294 lb 6.4 oz (133.5 kg)   LMP 10/09/2024   BMI 38.84 kg/m  Gen: NAD, resting comfortably CV: RRR no murmurs rubs or gallops Lungs: CTAB no crackles, wheeze, rhonchi Abdomen: soft/nontender/nondistended/normal bowel sounds. No rebound or guarding.  Ext: no edema Skin: warm, dry Shoulder: Inspection reveals no abnormalities, atrophy or asymmetry. Palpation is normal with no tenderness over AC joint-she does have pain over the occipital groove ROM is full in all planes but visibly painful. Rotator cuff strength mildly diminished Positive signs of impingement with a little Neer and Hawkin's tests, empty can. No painful arc and no drop arm sign.  Spurling test does produce some right neck and upper back pain though no pain into the shoulder or arm     Assessment and Plan   # Right shoulder pain S:2 weeks of pain. Can wake her up at night if she rolls onto that side.  Difficulty raising the arm as a result- occasionally pain goes down into arm. Motrin  helpful at first but not anymore. No known injury. Pain up to 10/10 when triggered. Heat in shower helps . Never had shoulder issues before.   Hurts into upper back, shoulder  but also under arm. Feels like a strin. Can feel it into her fingertips a tingling sensation. No rash on skin- ibuprofen  will dull it after 1.5 to 2 hours.   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  A/P: Patient with right shoulder pain for 2 weeks without fall or injury - Signs of impingement make me concern for rotator cuff tendinitis or bursitis - Offered sports medicine consult versus trial of anti-inflammatory with either prednisone or meloxicam  as well as physical therapy - She has had a really good experience with physical therapy  in the past and reports tolerating walks In the past so opted for that plus wanted to avoid any potential weight gain with prednisone  She also seems to have some neck and upper back pain-I wonder if she could be compensating to protect the shoulder.  I did not like that she has some pain radiating down into her arm but cannot reproduce with Spurling's.  I think it is worth a trial of physical therapy but we discussed there is a possibility of disc bulge in the neck contributing to symptoms but I still think bursitis/rotator cuff tendinitis most likely -she knows I'm more than happy to place sports medicine referral for failure to improve or worsening- she will reach out  Recommended follow up: Return for as needed for new, worsening, persistent symptoms. Future Appointments  Date Time Provider Department Center  11/14/2024  3:20 PM Berkeley Adelita PENNER, MD [facility] None  12/12/2024  1:40 PM Job Lukes, PA [facility]   Lab/Order associations:   ICD-10-CM   1. Acute pain of right shoulder  M25.511 Ambulatory referral to Physical Therapy    2. Neck pain  M54.2 Ambulatory referral to Physical Therapy      Meds ordered this encounter  Medications   meloxicam  (MOBIC ) 15 MG tablet    Sig: Take 1 tablet (15 mg total) by mouth daily.    Dispense:  10 tablet    Refill:  0    Return precautions advised.  Garnette Lukes, MD

## 2024-10-13 NOTE — Telephone Encounter (Signed)
 FYI Only or Action Required?: FYI only for provider: appointment scheduled on 10/13/2024.  Patient was last seen in primary care on 10/06/2024 by Berkeley Adelita PENNER, MD.  Called Nurse Triage reporting Shoulder Pain.  Symptoms began several weeks ago.  Interventions attempted: OTC medications: Ibuprofen .  Symptoms are: gradually worsening.  Triage Disposition: See HCP Within 4 Hours (Or PCP Triage)  Patient/caregiver understands and will follow disposition?: Yes     Copied from CRM #[id number]. Topic: Clinical - Red Word Triage >> Oct 13, 2024  2:50 PM Candice Kane wrote: [facility] that prompted transfer to Nurse Triage: Patient states she is having bad right shoulder pain, Reason for Disposition  Weakness (i.e., loss of strength) in hand or fingers  (Exception: Not truly weak; hand feels weak because of pain.)  Answer Assessment - Initial Assessment Questions 1. ONSET: When did the pain start?     2 weeks ago  2. LOCATION: Where is the pain located?     R shoulder  3. PAIN: How bad is the pain? (Scale 1-10; or mild, moderate, severe)     Severe  4. CAUSE: What do you think is causing the shoulder pain?     Fells like sciatic nerve pain 5. OTHER SYMPTOMS: Do you have any other symptoms? (e.g., neck pain, swelling, rash, fever, numbness, weakness)     Weakness   Pt states she is unable to lift her arm over her shoulder due to weakness. Pain radiates to the lower part of her neck to her back . Took ibuprofen  for symptoms.  Protocols used: Shoulder Pain-A-AH

## 2024-10-13 NOTE — Patient Instructions (Addendum)
 Trial meloxicam  for 10 days- take with food. No ibuprofen  with this  I think you have either strained your rotator cuff or have bursitis- lets try physical therapy but if not improving or worsens let me refer you to sports medicine with [facility]   Recommended follow up: Return for as needed for new, worsening, persistent symptoms. Also want you to schedule with [facility] for yearly before you leave

## 2024-10-14 NOTE — Assessment & Plan Note (Signed)
 Tolerating semaglutide  at current dose.  No changes in treatment plan at this time.  Needs refill of semaglutide .

## 2024-10-14 NOTE — Assessment & Plan Note (Signed)
 Continues to work on limiting saturated fat intake daily.  Focusing on protein intake.  Not currently on medication for cholesterol management.  Needs repeat labs in the next 1 to 2 months.

## 2024-10-14 NOTE — Telephone Encounter (Signed)
Noted, pt was seen. 

## 2024-10-14 NOTE — Assessment & Plan Note (Signed)
 Needs refill of prescription strength vitamin D  at this time.  No nausea, vomiting, or muscle weakness reported.

## 2024-10-14 NOTE — Assessment & Plan Note (Signed)
 Other Clinical Data Today's Visit #: 14 Starting Date: 10/01/23 Comments: Cat 3 Anthropometric Measurements Height: 6' 1 (1.854 m) Weight: 289 lb (131.1 kg) BMI (Calculated): 38.14 Weight at Last Visit: 301 lb Weight Lost Since Last Visit: 12 lb Weight Gained Since Last Visit: 0 Starting Weight: 326 lb Total Weight Loss (lbs): 37 lb (16.8 kg)

## 2024-10-24 ENCOUNTER — Ambulatory Visit: Payer: Self-pay

## 2024-10-24 DIAGNOSIS — M25511 Pain in right shoulder: Secondary | ICD-10-CM

## 2024-10-24 DIAGNOSIS — M542 Cervicalgia: Secondary | ICD-10-CM

## 2024-10-24 NOTE — Telephone Encounter (Signed)
 Spoke to pt told her I placed a referral for Sports Medicine and someone will contact you to schedule an appt. Pt verbalized understanding. Referral placed in Epic.

## 2024-10-24 NOTE — Telephone Encounter (Signed)
 Please see Triage note and advise if okay to place referral to Sports Medicine?

## 2024-10-24 NOTE — Telephone Encounter (Signed)
 FYI Only or Action Required?: Action required by provider: referral request.to sports med  Patient was last seen in primary care on 10/13/2024 by Katrinka Garnette KIDD, MD.  Called Nurse Triage reporting Arm Pain.  Symptoms began several weeks ago.  Interventions attempted: OTC medications: Tyleno, Prescription medications: Mobic , and Rest, hydration, or home remedies.  Symptoms are: gradually worsening.  Triage Disposition: See PCP When Office is Open (Within 3 Days)  Patient/caregiver understands and will follow disposition?: No, wishes to speak with PCP   Copied from CRM #[id number]. Topic: Clinical - Red Word Triage >> Oct 24, 2024  3:35 PM Taleah C wrote: Red Word that prompted transfer to Nurse Triage: arm pain getting worse, burning sensation, difficulty sleeping, sporadic pain out of nowhere, meds that were prescribed is not working.. Reason for Disposition  [1] MODERATE pain (e.g., interferes with normal activities) AND [2] present > 3 days  Answer Assessment - Initial Assessment Questions Additional info: 1) Evaluated on 10/13/24 for shoulder and arm pain, prescribed Mobic  and referred to physical therapy.   2) Mobic  is not helping, Tylenol  not helping, PT starting mid December. She would like referral to orthopedic/sports medication.    1. ONSET: When did the pain start?     Several weeks 2. LOCATION: Where is the pain located?     Right arm and shoulder  3. PAIN: How bad is the pain? (Scale 0-10; or none, mild, moderate, severe)     Becoming more frequent, intensity not increasing 4. WORK OR EXERCISE: Has there been any recent work or exercise that involved this part of the body?     Starting to effect her ability to work 5. CAUSE: What do you think is causing the arm pain?     Unsure  6. OTHER SYMPTOMS: Do you have any other symptoms? (e.g., neck pain, swelling, rash, fever, numbness, weakness)     Difficulty sleeping due to pain.  7. PREGNANCY: Is there  any chance you are pregnant? When was your last menstrual period?  Protocols used: Arm Pain-A-AH

## 2024-10-24 NOTE — Addendum Note (Signed)
 Addended by: THURMON ARLAND PARAS on: 10/24/2024 04:34 PM   Modules accepted: Orders

## 2024-10-25 ENCOUNTER — Encounter: Payer: Self-pay | Admitting: Family Medicine

## 2024-10-25 MED ORDER — TRAMADOL HCL 50 MG PO TABS: 50.0000 mg | ORAL_TABLET | Freq: Three times a day (TID) | ORAL | 0 refills | Status: DC | PRN

## 2024-10-31 NOTE — Progress Notes (Unsigned)
 Ben Tucker Minter D.[facility]MENT[facility] [address] [place] [address] Phone: [phone]   Assessment and Plan:     1. Acute pain of right shoulder (Primary) 2. Subacromial bursitis of right shoulder joint -Acute, initial visit - 2 to 4 weeks of right shoulder pain most consistent with subacromial bursitis likely flared from side sleeping and physical activity - No significant relief with intermittent Motrin  and meloxicam  use.  May continue to use meloxicam  as needed.  Recommend only using NSAIDs once per week to decrease long-term side effects - Patient elected for subacromial CSI.  Tolerated well per note below.  CSI temporarily increase blood glucose in patient with past medical history of DM type II - Start HEP for rotator cuff  Procedure: Subacromial Injection Side: Right  Risks explained and consent was given verbally. The site was cleaned with alcohol prep. A steroid injection was performed from posterior approach using 2mL of 1% lidocaine  without epinephrine  and 1mL of kenalog 40mg /ml. This was well tolerated.  Needle was removed, hemostasis achieved, and post injection instructions were explained.   Pt was advised to call or return to clinic if these symptoms worsen or fail to improve as anticipated.   Pertinent previous records reviewed include family medicine note 10/13/2024   Follow Up: 4 weeks for reevaluation.  If no improvement or worsening of symptoms, could consider physical therapy versus prednisone course versus ultrasound   Subjective:   I, Claretha Schimke am a scribe for Dr. Leonce.   Chief Complaint: right shoulder pain   HPI:   11/01/2024 Patient is a 38 year old female presenting to [facility] for right shoulder pain. Patient saw Dr. Katrinka on 10/13/2024 for this issue stating 2 weeks of pain. Can wake her up at night if she rolls onto that side. Difficulty raising the arm as a result- occasionally pain goes down  into arm. Motrin  helpful at first but not anymore. No known injury. Pain up to 10/10 when triggered. Heat in shower helps . Never had shoulder issues before. Hurts into upper back, shoulder but also under arm. Feels like a strin. Can feel it into her fingertips a tingling sensation. No rash on skin- ibuprofen  will dull it after 1.5 to 2 hours. Patient was given Meloxicam  that did not help and was also given tramadol  and referred to sports medicine and PT. Has not started PT yet but is scheduled   Today patient states pain does wake her up out of her sleep. No injury to cause pain.   Relevant Historical Information: Vitamin D  and Iron  deficiency, DM type II  Additional pertinent review of systems negative.   Current Outpatient Medications:    Insulin  Pen Needle (BD PEN NEEDLE NANO 2ND GEN) 32G X 4 MM MISC, 1 Package by Does not apply route 2 (two) times daily., Disp: 100 each, Rfl: 0   meloxicam  (MOBIC ) 15 MG tablet, Take 1 tablet (15 mg total) by mouth daily., Disp: 10 tablet, Rfl: 0   Prenatal Vit-Fe Fumarate-FA (PRENATAL MULTIVITAMIN) TABS tablet, Take 1 tablet by mouth daily at 12 noon., Disp: , Rfl:    Semaglutide ,0.25 or 0.5MG /DOS, (OZEMPIC , 0.25 OR 0.5 MG/DOSE,) 2 MG/3ML SOPN, Inject 0.5 mg into the skin once a week., Disp: 3 mL, Rfl: 1   traMADol  (ULTRAM ) 50 MG tablet, Take 1 tablet (50 mg total) by mouth every 8 (eight) hours as needed for moderate pain (pain score 4-6) or severe pain (pain score 7-10) (do not drive  for 8 hours after taking)., Disp: 15 tablet, Rfl: 0   VENTOLIN  HFA 108 (90 Base) MCG/ACT inhaler, Inhale into the lungs., Disp: , Rfl:    Vitamin D , Ergocalciferol , (DRISDOL ) 1.25 MG (50000 UNIT) CAPS capsule, Take 1 capsule (50,000 Units total) by mouth every 7 (seven) days., Disp: 12 capsule, Rfl: 0   Objective:     Vitals:   11/01/24 1511  BP: 114/60  Pulse: 62  Weight: 297 lb (134.7 kg)  Height: 6' 1 (1.854 m)      Body mass index is 39.18 kg/m.    Physical  Exam:    Gen: Appears well, nad, nontoxic and pleasant Neuro:sensation intact, strength is 5/5, muscle tone wnl Skin: no suspicious lesion or defmority Psych: A&O, appropriate mood and affect  Right shoulder:  No deformity, swelling or muscle wasting No scapular winging FF 180, abd 180, int 15, ext 90 NTTP over the [place], clavicle, ac, coracoid, biceps groove, humerus, deltoid, trapezius, cervical spine Positive empty can, O'Brien's, Hawkins Neg neer,  crossarm, subscap liftoff, speeds Neg ant drawer, sulcus sign, apprehension Negative Spurling's test bilat FROM of neck    Electronically signed by:  Odis Mace D.[facility] 3:32 PM 11/01/24

## 2024-11-01 ENCOUNTER — Ambulatory Visit: Admitting: Sports Medicine

## 2024-11-01 VITALS — BP 114/60 | HR 62 | Ht 73.0 in | Wt 297.0 lb

## 2024-11-01 DIAGNOSIS — M7551 Bursitis of right shoulder: Secondary | ICD-10-CM

## 2024-11-01 DIAGNOSIS — M25511 Pain in right shoulder: Secondary | ICD-10-CM

## 2024-11-01 NOTE — Patient Instructions (Addendum)
 Injection in shoulder today. Shoulder HEP. Follow up in 4 weeks.

## 2024-11-14 ENCOUNTER — Ambulatory Visit (INDEPENDENT_AMBULATORY_CARE_PROVIDER_SITE_OTHER): Admitting: Family Medicine

## 2024-11-14 ENCOUNTER — Encounter (INDEPENDENT_AMBULATORY_CARE_PROVIDER_SITE_OTHER): Payer: Self-pay | Admitting: Family Medicine

## 2024-11-14 DIAGNOSIS — Z6838 Body mass index (BMI) 38.0-38.9, adult: Secondary | ICD-10-CM

## 2024-11-14 DIAGNOSIS — E1165 Type 2 diabetes mellitus with hyperglycemia: Secondary | ICD-10-CM

## 2024-11-14 DIAGNOSIS — Z7985 Long-term (current) use of injectable non-insulin antidiabetic drugs: Secondary | ICD-10-CM | POA: Diagnosis not present

## 2024-11-14 DIAGNOSIS — E669 Obesity, unspecified: Secondary | ICD-10-CM

## 2024-11-14 MED ORDER — OZEMPIC (0.25 OR 0.5 MG/DOSE) 2 MG/3ML ~~LOC~~ SOPN: 0.5000 mg | PEN_INJECTOR | SUBCUTANEOUS | 1 refills | Status: DC

## 2024-11-14 NOTE — Progress Notes (Signed)
" ° °  SUBJECTIVE:  Chief Complaint: Obesity  Interim History: Patient had a good Thanksgiving.  She got a cortisone shot 2 weeks ago due to shoulder pain.  She missed her menstrual cycle after her steroid injection.  Her food intake has been more after the steroid injection.  She has been exercising more consistently on the weekends but not so much during the week. Planning to go to [facility] next Monday and drive home Christmas Day.  This is her first time celebrating Christmas not at home.    Candice Kane is here to discuss her progress with her obesity treatment plan. She is on the Category 3 Plan and states she is following her eating plan approximately 65 % of the time. She states she is exercising 45 minutes 5 times per week.   OBJECTIVE: Visit Diagnoses: Problem List Items Addressed This Visit       Endocrine   Type 2 diabetes mellitus with hyperglycemia (HCC)   Relevant Medications   Semaglutide ,0.25 or 0.5MG /DOS, (OZEMPIC , 0.25 OR 0.5 MG/DOSE,) 2 MG/3ML SOPN    Vitals Temp: 98.6 F (37 C) BP: 111/72 Pulse Rate: 77 SpO2: 96 %   Anthropometric Measurements Height: 6' 1 (1.854 m) Weight: 288 lb (130.6 kg) BMI (Calculated): 38.01 Weight at Last Visit: 289 lb Weight Lost Since Last Visit: 1 Weight Gained Since Last Visit: 0 Starting Weight: 326 lb Total Weight Loss (lbs): 38 lb (17.2 kg)   Body Composition  Body Fat %: 49 % Fat Mass (lbs): 141.4 lbs Muscle Mass (lbs): 139.8 lbs Total Body Water (lbs): 111 lbs Visceral Fat Rating : 12   Other Clinical Data Today's Visit #: 15 Starting Date: 10/01/23 Comments: Cat 3     ASSESSMENT AND PLAN: Assessment & Plan Type 2 diabetes mellitus with hyperglycemia, without long-term current use of insulin  (HCC) Patient is tolerating 0.5 mg dose of Ozempic  with no side effects mention.  She does find that this dose has been helping her during the holidays with her indulgent eating.  Did not help much after receiving steroid  injection.  Will continue on's Ozempic  0.5 mg dose and refill sent to pharmacy today. BMI 38.0-38.9,adult  Obesity with starting BMI of 43.1    Diet: Candice Kane is currently in the action stage of change. As such, her goal is to continue with weight loss efforts and has agreed to the Category 3 Plan.   Exercise:  All adults should avoid inactivity. Some activity is better than none, and adults who participate in any amount of physical activity, gain some health benefits.  Behavior Modification:  We discussed the following Behavioral Modification Strategies today: increasing lean protein intake, decreasing simple carbohydrates, meal planning and cooking strategies, travel eating strategies, and holiday eating strategies.   Return in about 5 weeks (around 12/19/2024).   She was informed of the importance of frequent follow up visits to maximize her success with intensive lifestyle modifications for her multiple health conditions.  Attestation Statements:   Reviewed by clinician on day of visit: allergies, medications, problem list, medical history, surgical history, family history, social history, and previous encounter notes.  Candice Cho, MD "

## 2024-11-25 ENCOUNTER — Ambulatory Visit: Payer: Self-pay

## 2024-11-25 NOTE — Telephone Encounter (Signed)
 Reviewed

## 2024-11-25 NOTE — Telephone Encounter (Signed)
 FYI Only or Action Required?: FYI only for provider: appointment scheduled on 12.29.25.  Patient was last seen in primary care on 11/14/2024 by Berkeley Adelita PENNER, MD.  Called Nurse Triage reporting Menstrual Problem.  Symptoms began a week ago.  Interventions attempted: Nothing.  Symptoms are: unchanged.  Triage Disposition: See PCP When Office is Open (Within 3 Days)  Patient/caregiver understands and will follow disposition?: Yes

## 2024-11-25 NOTE — Telephone Encounter (Signed)
" °  Reason for Disposition  [1] Heavier than normal periods (e.g., doubling up on pads to prevent leaking, needing to change pads overnight, unable to do normal activities) AND [2] last > 7 days  Answer Assessment - Initial Assessment Questions Pt states has very regular periods. She states that she was supposed to start 12/4 but didn't starte until 12/15 and has been on it since. She is having clots, pads/tampons every 2-3 hours. States she thought it was easing up then went back heavy again the next day. She denies any pain. States she did have one episode of dizziness but thinks it was from standing up too fast, none since then.   She states the only thing different is she had a cortisone injection on 12/3.    1. BLEEDING SEVERITY: Describe the bleeding that you are having. How much bleeding is there?      Some times clots, going through a pad or tampon every 2-3 hours 2. ONSET: When did the bleeding begin? Is it continuing now?     11/14/24 continues today 3. MENSTRUAL PERIOD: When was the last normal menstrual period? How is this different than your period?     November- very regular and not this heavy 4. REGULARITY: How regular are your periods?     very 5. ABDOMEN PAIN: Do you have any pain? How bad is the pain?  (e.g., Scale 0-10; none, mild, moderate, or severe)     denies 6. PREGNANCY: Is there any chance you are pregnant? When was your last menstrual period?     Currently on it  8. HORMONE MEDICINES: Are you taking any hormone medicines, prescription or over-the-counter? (e.g., birth control pills, estrogen)     Was supposed to be on prenatals for iron  supplement 9. BLOOD THINNER MEDICINES: Do you take any blood thinners? (e.g., Coumadin / warfarin, Pradaxa / dabigatran, aspirin )     denies 10. CAUSE: What do you think is causing the bleeding? (e.g., recent gyn surgery, recent gyn procedure; known bleeding disorder, cervical cancer, polycystic ovarian  disease, fibroids)         unknown 11. HEMODYNAMIC STATUS: Are you weak or feeling lightheaded? If Yes, ask: Can you stand and walk normally?        Denies problems with this.  12. OTHER SYMPTOMS: What other symptoms are you having with the bleeding? (e.g., passed tissue, vaginal discharge, fever, menstrual-type cramps)       Has passed some clots she states  Protocols used: Vaginal Bleeding - Abnormal-A-AH  "

## 2024-11-25 NOTE — Telephone Encounter (Signed)
 Message from [place] C sent at 11/25/2024  1:35 PM EST  Summary: Question about abnormal period   Reason for Triage: Patient had got a cortisone shot and her cycle was supposed to start the very next day. Her cycle was delayed and it finally came on the 15th and she has been bleeding since.   [phone] (M)         2nd attempt to contact patient-no answer. Left a voicemail for patient to call back to Nurse Triage.

## 2024-11-25 NOTE — Telephone Encounter (Signed)
 Message from [place] C sent at 11/25/2024  1:35 PM EST  Summary: Question about abnormal period   Reason for Triage: Patient had got a cortisone shot and her cycle was supposed to start the very next day. Her cycle was delayed and it finally came on the 15th and she has been bleeding since.          1st attempt to contact patient-no answer. Voicemail left for patient to call back to Nurse Triage.

## 2024-11-28 ENCOUNTER — Ambulatory Visit: Admitting: Family

## 2024-11-28 ENCOUNTER — Encounter: Payer: Self-pay | Admitting: Family

## 2024-11-28 VITALS — BP 120/82 | HR 92 | Temp 97.3°F | Ht 73.0 in | Wt 298.4 lb

## 2024-11-28 DIAGNOSIS — N921 Excessive and frequent menstruation with irregular cycle: Secondary | ICD-10-CM

## 2024-11-28 MED ORDER — TRANEXAMIC ACID 650 MG PO TABS: 1300.0000 mg | ORAL_TABLET | Freq: Three times a day (TID) | ORAL | 0 refills | Status: DC

## 2024-11-28 NOTE — Assessment & Plan Note (Signed)
 Patient is tolerating 0.5 mg dose of Ozempic  with no side effects mention.  She does find that this dose has been helping her during the holidays with her indulgent eating.  Did not help much after receiving steroid injection.  Will continue on's Ozempic  0.5 mg dose and refill sent to pharmacy today.

## 2024-11-28 NOTE — Progress Notes (Signed)
 "  Patient ID: Candice Kane, female    DOB: Apr 26, 1986, 38 y.o.   MRN: [id number]  Chief Complaint  Patient presents with   Menstrual Problem    Pt c/o heavier than normal menstrual cycle with blood clots, present since December 15. Pt would like to discuss options  Discussed the use of AI scribe software for clinical note transcription with the patient, who gave verbal consent to proceed.  History of Present Illness Candice Kane is a 38 year old female who presents with excessive menstrual bleeding.  Her typically regular menses, expected on December 4th or 5th, were delayed until December 15th after a cortisone shoulder injection on December 3rd. Since then she has had continuous heavy bleeding with clots, initially light but very heavy by the following Tuesday, now somewhat improved but still bright red, using about three pads per day. She reports lightheadedness, dizziness on standing, fatigue, and intermittent cramping. She has prior low iron  and currently takes a prenatal vitamin with iron . She denies known fibroids, blood clots, and recent birth control use. She has been on a GLP-1 injection since February with no recent dose changes.  Assessment & Plan Menorrhagia with irregular cycle Menorrhagia since December 14th with heavy bleeding and clots. Cycle delayed. Possible hormonal changes or perimenopausal symptoms. Fatigue and dizziness likely from blood loss. Unlikely related to recent cortisone injection. No fibroids or thrombosis history. - Started tranexamic acid  650 mg, two pills three times a day for up to five days or until bleeding stops. - Continue prenatal vitamins with iron . - Monitor for tranexamic acid  side effects: headache, back pain, abdominal pain, muscle aches. - Consider gynecologist referral if bleeding persists for further evaluation, including ultrasound to rule out fibroids.  Subjective:    Outpatient Medications Prior to Visit  Medication Sig Dispense Refill    Insulin  Pen Needle (BD PEN NEEDLE NANO 2ND GEN) 32G X 4 MM MISC 1 Package by Does not apply route 2 (two) times daily. 100 each 0   meloxicam  (MOBIC ) 15 MG tablet Take 1 tablet (15 mg total) by mouth daily. 10 tablet 0   Prenatal Vit-Fe Fumarate-FA (PRENATAL MULTIVITAMIN) TABS tablet Take 1 tablet by mouth daily at 12 noon.     Semaglutide ,0.25 or 0.5MG /DOS, (OZEMPIC , 0.25 OR 0.5 MG/DOSE,) 2 MG/3ML SOPN Inject 0.5 mg into the skin once a week. 3 mL 1   traMADol  (ULTRAM ) 50 MG tablet Take 1 tablet (50 mg total) by mouth every 8 (eight) hours as needed for moderate pain (pain score 4-6) or severe pain (pain score 7-10) (do not drive for 8 hours after taking). 15 tablet 0   VENTOLIN  HFA 108 (90 Base) MCG/ACT inhaler Inhale into the lungs. (Patient taking differently: Inhale into the lungs as needed.)     Vitamin D , Ergocalciferol , (DRISDOL ) 1.25 MG (50000 UNIT) CAPS capsule Take 1 capsule (50,000 Units total) by mouth every 7 (seven) days. 12 capsule 0   No facility-administered medications prior to visit.   Past Medical History:  Diagnosis Date   Alpha thalassemia silent carrier 06/15/2022   Anemia    Back pain    Chest pain    Gestational diabetes    History of chlamydia 12/01/2009   Lactose intolerance    Sciatica of right side    SOB (shortness of breath)    Vaginal delivery    2012, 2023   Varicose veins    Past Surgical History:  Procedure Laterality Date   ADENOIDECTOMY     BACK SURGERY  08/14/2021   LASER ABLATION Right 09/15/2024   Lasers ablation of the right greater saphenous vein with >20 stab phlebectomies   TONSILLECTOMY     Allergies[1]    Objective:    Physical Exam Vitals and nursing note reviewed.  Constitutional:      Appearance: Normal appearance. She is obese.  Cardiovascular:     Rate and Rhythm: Normal rate and regular rhythm.  Pulmonary:     Effort: Pulmonary effort is normal.     Breath sounds: Normal breath sounds.   Musculoskeletal:        General: Normal range of motion.  Skin:    General: Skin is warm and dry.  Neurological:     Mental Status: She is alert.  Psychiatric:        Mood and Affect: Mood normal.        Behavior: Behavior normal.    BP 120/82 (BP Location: Left Arm, Patient Position: Sitting, Cuff Size: Large)   Pulse 92   Temp (!) 97.3 F (36.3 C) (Temporal)   Ht 6' 1 (1.854 m)   Wt 298 lb 6 oz (135.3 kg)   LMP 11/14/2024 (Exact Date)   SpO2 98%   BMI 39.37 kg/m  Wt Readings from Last 3 Encounters:  11/28/24 298 lb 6 oz (135.3 kg)  11/14/24 288 lb (130.6 kg)  11/01/24 297 lb (134.7 kg)      Caleb Decock, NP       [1] No Known Allergies "

## 2024-11-29 NOTE — Progress Notes (Signed)
 "               Candice Kane D.[facility] [address] [place] [address] Phone: [phone]   Assessment and Plan:     1. Acute pain of right shoulder (Primary) 2. Subacromial bursitis of right shoulder joint -Acute, significant improvement, subsequent visit - Significant improvement in right shoulder pain after subacromial CSI on 11/01/2024 - May continue HEP - No further workup or treatment necessary at this time - Of note, patient has experienced a heavy menstrual period since injection on 11/01/2024.  We discussed that it would be unlikely that a subacromial CSI could affect hormonal levels significant enough to cause this event    Pertinent previous records reviewed include  ER note 12/04/2024   Follow Up: As needed if no improvement or worsening of symptoms.  Could discuss repeat injection versus prednisone course versus physical therapy versus ultrasound   Subjective:   I, Candice Kane, am serving as a neurosurgeon for Candice Kane   Chief Complaint: right shoulder pain    HPI:    11/01/2024 Patient is a 38 year old female presenting to [facility] for right shoulder pain. Patient saw Dr. Katrinka on 10/13/2024 for this issue stating 2 weeks of pain. Can wake her up at night if she rolls onto that side. Difficulty raising the arm as a result- occasionally pain goes down into arm. Motrin  helpful at first but not anymore. No known injury. Pain up to 10/10 when triggered. Heat in shower helps . Never had shoulder issues before. Hurts into upper back, shoulder but also under arm. Feels like a strin. Can feel it into her fingertips a tingling sensation. No rash on skin- ibuprofen  will dull it after 1.5 to 2 hours. Patient was given Meloxicam  that did not help and was also given tramadol  and referred to sports medicine and PT. Has not started PT yet but is scheduled    Today patient states pain does wake her up out of her sleep. No injury to  cause pain.   12/05/2024 Patient states arm is perfect.    Relevant Historical Information: Vitamin D  and Iron  deficiency, DM type II  Additional pertinent review of systems negative.  Current Medications[1]   Objective:     Vitals:   12/05/24 1540  Pulse: 78  SpO2: 98%  Weight: 298 lb (135.2 kg)  Height: 6' 1 (1.854 m)      Body mass index is 39.32 kg/m.    Physical Exam:    Gen: Appears well, nad, nontoxic and pleasant Neuro:sensation intact, strength is 5/5, muscle tone wnl Skin: no suspicious lesion or defmority Psych: A&O, appropriate mood and affect  Right shoulder:  No deformity, swelling or muscle wasting No scapular winging FF 180, abd 180, int 0, ext 90 NTTP over the [place], clavicle, ac, coracoid, biceps groove, humerus, deltoid, trapezius, cervical spine Neg neer, hawkins, empty can, obriens, crossarm, subscap liftoff, speeds Neg ant drawer, sulcus sign, apprehension Negative Spurling's test bilat FROM of neck    Electronically signed by:  Candice Kane D.[facility] 3:47 PM 12/05/2024     [1]  Current Outpatient Medications:    Insulin  Pen Needle (BD PEN NEEDLE NANO 2ND GEN) 32G X 4 MM MISC, 1 Package by Does not apply route 2 (two) times daily., Disp: 100 each, Rfl: 0   norgestimate -ethinyl estradiol  (ORTHO-CYCLEN) 0.25-35 MG-MCG tablet, Take 1 tablet by mouth daily., Disp: 30 tablet, Rfl: 11  Prenatal Vit-Fe Fumarate-FA (PRENATAL MULTIVITAMIN) TABS tablet, Take 1 tablet by mouth daily at 12 noon., Disp: , Rfl:    Semaglutide ,0.25 or 0.5MG /DOS, (OZEMPIC , 0.25 OR 0.5 MG/DOSE,) 2 MG/3ML SOPN, Inject 0.5 mg into the skin once a week., Disp: 3 mL, Rfl: 1   VENTOLIN  HFA 108 (90 Base) MCG/ACT inhaler, Inhale into the lungs. (Patient taking differently: Inhale into the lungs as needed.), Disp: , Rfl:    Vitamin D , Ergocalciferol , (DRISDOL ) 1.25 MG (50000 UNIT) CAPS capsule, Take 1 capsule (50,000 Units total) by mouth every 7 (seven) days.,  Disp: 12 capsule, Rfl: 0  "

## 2024-11-30 ENCOUNTER — Telehealth (INDEPENDENT_AMBULATORY_CARE_PROVIDER_SITE_OTHER): Payer: Self-pay

## 2024-11-30 NOTE — Telephone Encounter (Signed)
 Candice Kane (Key: [id number]) Ozempic  (0.25 or 0.5 MG/DOSE) 2MG /3ML pen-injectors

## 2024-11-30 NOTE — Telephone Encounter (Signed)
 Request Reference Number: [id number] OZEMPIC  INJ 2MG Candice Kane is approved through 11/30/2025.  Authorization Expiration Date: November 30, 2025.

## 2024-12-04 ENCOUNTER — Other Ambulatory Visit: Payer: Self-pay

## 2024-12-04 ENCOUNTER — Inpatient Hospital Stay (HOSPITAL_COMMUNITY)
Admission: EM | Admit: 2024-12-04 | Discharge: 2024-12-04 | Disposition: A | Discharge status: Home / Self Care | Attending: Obstetrics and Gynecology | Admitting: Obstetrics and Gynecology

## 2024-12-04 ENCOUNTER — Inpatient Hospital Stay (HOSPITAL_COMMUNITY)

## 2024-12-04 ENCOUNTER — Encounter (HOSPITAL_COMMUNITY): Payer: Self-pay

## 2024-12-04 DIAGNOSIS — D259 Leiomyoma of uterus, unspecified: Secondary | ICD-10-CM | POA: Diagnosis not present

## 2024-12-04 DIAGNOSIS — D219 Benign neoplasm of connective and other soft tissue, unspecified: Secondary | ICD-10-CM | POA: Diagnosis not present

## 2024-12-04 DIAGNOSIS — N921 Excessive and frequent menstruation with irregular cycle: Secondary | ICD-10-CM | POA: Diagnosis not present

## 2024-12-04 DIAGNOSIS — N939 Abnormal uterine and vaginal bleeding, unspecified: Secondary | ICD-10-CM | POA: Diagnosis present

## 2024-12-04 LAB — URINALYSIS, ROUTINE W REFLEX MICROSCOPIC
Bacteria, UA: NONE SEEN
Bilirubin Urine: NEGATIVE
Glucose, UA: NEGATIVE mg/dL
Ketones, ur: NEGATIVE mg/dL
Nitrite: NEGATIVE
Protein, ur: >=300 mg/dL — AB
RBC / HPF: >50 RBC/hpf (ref 0–5)
Specific Gravity, Urine: 1.03 (ref 1.005–1.030)
pH: 6 (ref 5.0–8.0)

## 2024-12-04 LAB — CBC
HCT: 30.5 % — ABNORMAL LOW (ref 36.0–46.0)
Hemoglobin: 9.3 g/dL — ABNORMAL LOW (ref 12.0–15.0)
MCH: 23.4 pg — ABNORMAL LOW (ref 26.0–34.0)
MCHC: 30.5 g/dL (ref 30.0–36.0)
MCV: 76.6 fL — ABNORMAL LOW (ref 80.0–100.0)
Platelets: 584 K/uL — ABNORMAL HIGH (ref 150–400)
RBC: 3.98 MIL/uL (ref 3.87–5.11)
RDW: 18.7 % — ABNORMAL HIGH (ref 11.5–15.5)
WBC: 9.5 K/uL (ref 4.0–10.5)
nRBC: 0 % (ref 0.0–0.2)

## 2024-12-04 LAB — HCG, QUANTITATIVE, PREGNANCY: hCG, Beta Chain, Quant, S: <1 m[IU]/mL

## 2024-12-04 MED ORDER — NORGESTIMATE-ETH ESTRADIOL 0.25-35 MG-MCG PO TABS: 1.0000 | ORAL_TABLET | Freq: Every day | ORAL | 11 refills | Status: AC

## 2024-12-04 NOTE — ED Triage Notes (Addendum)
 PT arrives via POV. Pt reports she has been experiencing heavy vaginal bleeding since 12/15. PT states she has saturated two large pads today. PT reports associated dizziness. She is AxOx4. She was started on 650mg  tablets of TXA on 12/29, which she reports has not helped. Vital Signs are stable during initial triage.

## 2024-12-04 NOTE — MAU Note (Signed)
 Candice Kane is a 39 y.o. at Unknown here in MAU reporting: not pregnant. Vaginal bleeding that started on 12/15- changing a pad Q 2-4 hrs. Had TXA on 12/29 with no improvement. Patient reports very minimal cramping. States bleeding is bright red with small clots present today.   Onset of complaint: 12/15 Vitals:   12/04/24 1244  BP: 116/83  Pulse: 96  Resp: 18  Temp: 99.5 F (37.5 C)  SpO2: 94%     Lab orders placed from triage:

## 2024-12-04 NOTE — ED Triage Notes (Signed)
Pt denies soaking more than one pad an hour

## 2024-12-04 NOTE — ED Provider Triage Note (Signed)
 Emergency Medicine Provider Triage Evaluation Note  Candice Kane , a 39 y.o. female  was evaluated in triage.  Pt complains of heavy vaginal bleeding since December 15.  Reports going through about 1 large pad in about 2 to 3 hours.  Reports she was started on TXA about a week ago by her PCP without improvement in symptoms.  Currently feeling dizzy and weak from the ongoing bleeding.  Review of Systems  Positive: As above Negative: As above  Physical Exam  BP 116/83   Pulse 96   Temp 99.5 F (37.5 C)   Resp 18   LMP 11/14/2024 (Exact Date)   SpO2 94%  Gen:   Awake, no distress   Resp:  Normal effort  MSK:   Moves extremities without difficulty    Medical Decision Making  Medically screening exam initiated at 1:00 PM.  Appropriate orders placed.  Lakira Ogando was informed that the remainder of the evaluation will be completed by another provider, this initial triage assessment does not replace that evaluation, and the importance of remaining in the ED until their evaluation is complete.     Veta Palma, PA-C 12/04/24 1300

## 2024-12-04 NOTE — MAU Provider Note (Signed)
 Chief Complaint:  Vaginal Bleeding and Dizziness   HPI   Event Date/Time   First Provider Initiated Contact with Patient 12/04/24 1417      Candice Kane is a 39 y.o. [id number] at Unknown who presents to maternity admissions reporting heavy vaginal bleeding. Menstrual cycle started December 15th and hasn't stopped. Received a hydrocortisone  injection on December 3rd. Cycle was supposed to start on December 4th or 5th. Denies history of fibroids or irregular menstrual cycles. Had an appointment at PCP office on December 29th - prescribed TXA to help with bleeding. Started at that time but has not been helpful. Last dose this morning. Does have dizziness and lightheadedness. Denies recent pregnancy, but does say that she is unable to rule out pregnancy.   Past Medical History:  Diagnosis Date   Alpha thalassemia silent carrier 06/15/2022   Anemia    Back pain    Chest pain    Gestational diabetes    History of chlamydia 12/01/2009   Lactose intolerance    Sciatica of right side    SOB (shortness of breath)    Vaginal delivery    2012, 2023   Varicose veins    OB History  Gravida Para Term Preterm AB Living  4 2 2  2 2   SAB IAB Ectopic Multiple Live Births  1   0 2    # Outcome Date GA Lbr Len/2nd Weight Sex Type Anes PTL Lv  4 Term 06/15/22 [redacted]w[redacted]d 17:13 / 00:12 3125 g M Vag-Spont EPI  LIV  3 SAB 01/20/21     SAB     2 AB 2020     TAB     1 Term 08/19/11 [redacted]w[redacted]d 05:22 / 00:28 3269 g M Vag-Spont EPI     Past Surgical History:  Procedure Laterality Date   ADENOIDECTOMY     BACK SURGERY  08/14/2021   LASER ABLATION Right 09/15/2024   Lasers ablation of the right greater saphenous vein with >20 stab phlebectomies   TONSILLECTOMY     Family History  Problem Relation Age of Onset   Cancer Mother        breast   Kidney failure Mother    Stroke Mother    Stroke Father    Diabetes Father    Hyperlipidemia Father    Hypertension Father    Drug abuse Father    Colon cancer  Maternal Grandmother        colon   COPD Maternal Grandfather    Social History[1] Allergies[2] No medications prior to admission.    I have reviewed patient's Past Medical Hx, Surgical Hx, Family Hx, Social Hx, medications and allergies.   ROS  Pertinent items noted in HPI and remainder of comprehensive ROS otherwise negative.   PHYSICAL EXAM  Patient Vitals for the past 24 hrs:  BP Temp Pulse Resp SpO2  12/04/24 1736 (!) 111/93 -- 79 -- 97 %  12/04/24 1340 131/83 -- 88 -- 100 %  12/04/24 1244 116/83 99.5 F (37.5 C) 96 18 94 %    Constitutional: Well-developed, well-nourished female in no acute distress.  Cardiovascular: normal rate & rhythm, warm and well-perfused Respiratory: normal effort, no problems with respiration noted GI: Abd soft, non-tender in all quadrants, non-distended MS: Extremities nontender, no edema, normal ROM Neurologic: Alert and oriented x 4.     Labs: Results for orders placed or performed during the hospital encounter of 12/04/24 (from the past 24 hours)  CBC     Status: Abnormal  Collection Time: 12/04/24  2:35 PM  Result Value Ref Range   WBC 9.5 4.0 - 10.5 K/uL   RBC 3.98 3.87 - 5.11 MIL/uL   Hemoglobin 9.3 (L) 12.0 - 15.0 g/dL   HCT 69.4 (L) 63.9 - 53.9 %   MCV 76.6 (L) 80.0 - 100.0 fL   MCH 23.4 (L) 26.0 - 34.0 pg   MCHC 30.5 30.0 - 36.0 g/dL   RDW 81.2 (H) 88.4 - 84.4 %   Platelets 584 (H) 150 - 400 K/uL   nRBC 0.0 0.0 - 0.2 %  hCG, quantitative, pregnancy     Status: None   Collection Time: 12/04/24  2:35 PM  Result Value Ref Range   hCG, Beta Chain, Quant, S <1 <5 mIU/mL  Urinalysis, Routine w reflex microscopic -Urine, Clean Catch     Status: Abnormal   Collection Time: 12/04/24  3:05 PM  Result Value Ref Range   Color, Urine AMBER (A) YELLOW   APPearance CLOUDY (A) CLEAR   Specific Gravity, Urine 1.030 1.005 - 1.030   pH 6.0 5.0 - 8.0   Glucose, UA NEGATIVE NEGATIVE mg/dL   Hgb urine dipstick LARGE (A) NEGATIVE    Bilirubin Urine NEGATIVE NEGATIVE   Ketones, ur NEGATIVE NEGATIVE mg/dL   Protein, ur >=699 (A) NEGATIVE mg/dL   Nitrite NEGATIVE NEGATIVE   Leukocytes,Ua SMALL (A) NEGATIVE   RBC / HPF >50 0 - 5 RBC/hpf   WBC, UA 0-5 0 - 5 WBC/hpf   Bacteria, UA NONE SEEN NONE SEEN   Squamous Epithelial / HPF 11-20 0 - 5 /HPF   Mucus PRESENT     Imaging:  US  PELVIC COMPLETE WITH TRANSVAGINAL Result Date: 12/04/2024 CLINICAL DATA:  Vaginal bleeding EXAM: TRANSABDOMINAL AND TRANSVAGINAL ULTRASOUND OF PELVIS TECHNIQUE: Both transabdominal and transvaginal ultrasound examinations of the pelvis were performed. Transabdominal technique was performed for global imaging of the pelvis including uterus, ovaries, adnexal regions, and pelvic cul-de-sac. It was necessary to proceed with endovaginal exam following the transabdominal exam to visualize the endometrium and ovaries. COMPARISON:  None Available. FINDINGS: Uterus Measurements: 8.8 x 5.7 x 5.4 cm = volume: 143 mL. Two uterine fibroids are noted, the largest measuring 2.8 x 2.2 x 1.9 cm. Endometrium Thickness: 2 mm which is within normal limits. No focal abnormality visualized. Right ovary Measurements: 3.1 x 2.1 x 2.0 cm = volume: 7 mL. Normal appearance/no adnexal mass. Left ovary Measurements: 4.7 x 3.1 x 1.6 cm = volume: 13 mL. Normal appearance/no adnexal mass. Other findings Trace free fluid is noted which most likely is physiologic. IMPRESSION: Two uterine fibroids are noted, the largest measuring 2.8 cm. No other abnormality seen in the pelvis. If bleeding remains unresponsive to hormonal or medical therapy, sonohysterogram should be considered for focal lesion work-up. (Ref: Radiological Reasoning: Algorithmic Workup of Abnormal Vaginal Bleeding with Endovaginal Sonography and Sonohysterography. AJR 2008; [id number]:D31-26). Electronically Signed   By: Lynwood Landy Raddle M.D.   On: 12/04/2024 16:34    MDM & MAU COURSE  MDM: Moderate  MAU Course: Orders Placed This  Encounter  Procedures   US  PELVIC COMPLETE WITH TRANSVAGINAL   CBC   Urinalysis, Routine w reflex microscopic -Urine, Clean Catch   hCG, quantitative, pregnancy   Discharge patient   Meds ordered this encounter  Medications   norgestimate -ethinyl estradiol  (ORTHO-CYCLEN) 0.25-35 MG-MCG tablet    Sig: Take 1 tablet by mouth daily.    Dispense:  30 tablet    Refill:  11   VSS on  arrival. Exam unremarkable. Plan for CBC, hCG and ultrasound. Patient comfortable with plan. Declined pain medications at this time.  5p - Ultrasound showing 2 fibroids, seen at bedside to discuss results.  CBC stable. hCG negative. Discussed medication options for menorrhagia, would like to start birth control pills but would like an IUD long-term. Birth control pills sent to preferred pharmacy, advised to follow up with PCP for IUD and continued management of menorrhagia. All questions answered prior to discharge.  ASSESSMENT   1. Fibroid   2. Menorrhagia with irregular cycle     PLAN  Discharge home in stable condition with return precautions.  Follow up with PCP.    Allergies as of 12/04/2024   No Known Allergies      Medication List     STOP taking these medications    tranexamic acid  650 MG Tabs tablet Commonly known as: LYSTEDA        TAKE these medications    BD Pen Needle Nano 2nd Gen 32G X 4 MM Misc Generic drug: Insulin  Pen Needle 1 Package by Does not apply route 2 (two) times daily.   norgestimate -ethinyl estradiol  0.25-35 MG-MCG tablet Commonly known as: ORTHO-CYCLEN Take 1 tablet by mouth daily.   Ozempic  (0.25 or 0.5 MG/DOSE) 2 MG/3ML Sopn Generic drug: Semaglutide (0.25 or 0.5MG /DOS) Inject 0.5 mg into the skin once a week.   prenatal multivitamin Tabs tablet Take 1 tablet by mouth daily at 12 noon.   Ventolin  HFA 108 (90 Base) MCG/ACT inhaler Generic drug: albuterol  Inhale into the lungs. What changed:  when to take this reasons to take this   Vitamin D   (Ergocalciferol ) 1.25 MG (50000 UNIT) Caps capsule Commonly known as: DRISDOL  Take 1 capsule (50,000 Units total) by mouth every 7 (seven) days.        Charlie Courts, MD  Family Medicine - Obstetrics Fellow        [1]  Social History Tobacco Use   Smoking status: Former    Types: Cigars   Smokeless tobacco: Never  Vaping Use   Vaping status: Some Days   Substances: THC  Substance Use Topics   Alcohol use: Yes    Comment: Occasionally   Drug use: Not Currently    Types: Marijuana  [2] No Known Allergies

## 2024-12-04 NOTE — Discharge Instructions (Signed)
 You came into the hospital with vaginal bleeding. We did bloodwork which showed lower blood levels but not to the point of needing a blood transfusion. We also did an ultrasound which showed fibroids. Please stop taking TXA and start taking Spryntec - 1 pill daily. Please follow up with your primary care doctor to talk about placing an IUD.

## 2024-12-05 ENCOUNTER — Ambulatory Visit (INDEPENDENT_AMBULATORY_CARE_PROVIDER_SITE_OTHER): Admitting: Sports Medicine

## 2024-12-05 VITALS — HR 78 | Ht 73.0 in | Wt 298.0 lb

## 2024-12-05 DIAGNOSIS — M25511 Pain in right shoulder: Secondary | ICD-10-CM | POA: Diagnosis not present

## 2024-12-05 DIAGNOSIS — M7551 Bursitis of right shoulder: Secondary | ICD-10-CM

## 2024-12-12 ENCOUNTER — Encounter: Payer: Self-pay | Admitting: Physician Assistant

## 2024-12-12 ENCOUNTER — Ambulatory Visit: Admitting: Physician Assistant

## 2024-12-12 VITALS — BP 116/78 | HR 89 | Temp 98.2°F | Resp 16 | Ht 73.0 in | Wt 294.2 lb

## 2024-12-12 DIAGNOSIS — E1165 Type 2 diabetes mellitus with hyperglycemia: Secondary | ICD-10-CM | POA: Diagnosis not present

## 2024-12-12 DIAGNOSIS — N921 Excessive and frequent menstruation with irregular cycle: Secondary | ICD-10-CM

## 2024-12-12 DIAGNOSIS — Z0001 Encounter for general adult medical examination with abnormal findings: Secondary | ICD-10-CM | POA: Diagnosis not present

## 2024-12-12 DIAGNOSIS — L989 Disorder of the skin and subcutaneous tissue, unspecified: Secondary | ICD-10-CM

## 2024-12-12 DIAGNOSIS — J4520 Mild intermittent asthma, uncomplicated: Secondary | ICD-10-CM

## 2024-12-12 DIAGNOSIS — Z0000 Encounter for general adult medical examination without abnormal findings: Secondary | ICD-10-CM

## 2024-12-12 DIAGNOSIS — Z7985 Long-term (current) use of injectable non-insulin antidiabetic drugs: Secondary | ICD-10-CM

## 2024-12-12 DIAGNOSIS — Z124 Encounter for screening for malignant neoplasm of cervix: Secondary | ICD-10-CM

## 2024-12-12 MED ORDER — VENTOLIN HFA 108 (90 BASE) MCG/ACT IN AERS: 2.0000 | INHALATION_SPRAY | RESPIRATORY_TRACT | 1 refills | Status: AC | PRN

## 2024-12-12 MED ORDER — CLINDAMYCIN PHOSPHATE 1 % EX SWAB: CUTANEOUS | 1 refills | Status: AC

## 2024-12-12 NOTE — Progress Notes (Signed)
 "  Subjective:    Candice Kane is a 39 y.o. female and is here for a comprehensive physical exam.  HPI  There are no preventive care reminders to display for this patient.  Discussed the use of AI scribe software for clinical note transcription with the patient, who gave verbal consent to proceed.  History of Present Illness   Candice Kane is a 39 year old female with diabetes who presents for a follow-up visit.  She underwent a venous ablation on her right leg without complications. On January 4 she went to the emergency room for prolonged menstrual bleeding that started December 15. Ultrasound and labs showed two small fibroids and she was restarted on birth control, which stopped the bleeding.  She has a painful knot in the panty line area that she thinks may be a cyst or boil. She has boils in the groin area two to three times per year that usually resolve without treatment, but this lesion is more painful than prior episodes.  She takes Ozempic  0.5 mg for diabetes. Her last A1c was 6.0. This is managed by medical weight management.  She exercises with her 75 year old son by going to the gym or walking three to four times weekly and has reduced fried foods. She has difficulty falling asleep but sleeps well once asleep. She does not drink alcohol regularly and does not smoke.  She had headaches that she associates with a medication used to stop bleeding (Tranexamic acid ) which resolved after stopping that medication. She denies chest pain or shortness of breath.      Health Maintenance: Immunizations -- UpToDate  Colonoscopy -- N/A  Mammogram -- N/A  PAP -- UpToDate  Bone Density -- n/a Diet -- working on healthy diet Exercise -- going to gym regularly  Sleep habits -- overall stable Mood -- well balanced  UTD with dentist? - yes UTD with eye doctor? - yes  Weight history: Wt Readings from Last 10 Encounters:  12/12/24 294 lb 3.2 oz (133.4 kg)  12/05/24 298 lb (135.2 kg)   11/28/24 298 lb 6 oz (135.3 kg)  11/14/24 288 lb (130.6 kg)  11/01/24 297 lb (134.7 kg)  10/13/24 294 lb 6.4 oz (133.5 kg)  10/06/24 289 lb (131.1 kg)  09/15/24 297 lb (134.7 kg)  09/01/24 (!) 301 lb (136.5 kg)  07/26/24 297 lb (134.7 kg)   Body mass index is 38.82 kg/m. Patient's last menstrual period was 11/14/2024 (exact date).  Alcohol use:  reports current alcohol use.  Tobacco use:  Tobacco Use: Medium Risk (12/04/2024)   Patient History    Smoking Tobacco Use: Former    Smokeless Tobacco Use: Never    Passive Exposure: Not on file   Eligible for lung cancer screening? no     12/12/2024    1:51 PM  Depression screen PHQ 2/9  Decreased Interest 0  Down, Depressed, Hopeless 0  PHQ - 2 Score 0     Other providers/specialists: Patient Care Team: Job Lukes, [place] as PCP - General (Physician Assistant) Rudy Carlin LABOR, [place] as PCP - OBGYN (Obstetrics and Gynecology)    PMHx, SurgHx, SocialHx, Medications, and Allergies were reviewed in the Visit Navigator and updated as appropriate.   Past Medical History:  Diagnosis Date   Alpha thalassemia silent carrier 06/15/2022   Anemia    Back pain    Chest pain    Gestational diabetes    History of chlamydia 12/01/2009   Lactose intolerance    Sciatica of right  side    SOB (shortness of breath)    Vaginal delivery    2012, 2023   Varicose veins      Past Surgical History:  Procedure Laterality Date   ADENOIDECTOMY     BACK SURGERY  08/14/2021   LASER ABLATION Right 09/15/2024   Lasers ablation of the right greater saphenous vein with >20 stab phlebectomies   TONSILLECTOMY       Family History  Problem Relation Age of Onset   Cancer Mother        breast   Kidney failure Mother    Stroke Mother    Stroke Father    Diabetes Father    Hyperlipidemia Father    Hypertension Father    Drug abuse Father    Colon cancer Maternal Grandmother        colon   COPD Maternal Grandfather     Social  History[1]  Review of Systems:   Review of Systems  Constitutional:  Negative for chills, fever, malaise/fatigue and weight loss.  HENT:  Negative for hearing loss, sinus pain and sore throat.   Respiratory:  Negative for cough and hemoptysis.   Cardiovascular:  Negative for chest pain, palpitations, leg swelling and PND.  Gastrointestinal:  Negative for abdominal pain, constipation, diarrhea, heartburn, nausea and vomiting.  Genitourinary:  Negative for dysuria, frequency and urgency.  Musculoskeletal:  Negative for back pain, myalgias and neck pain.  Skin:  Negative for itching and rash.  Neurological:  Negative for dizziness, tingling, seizures and headaches.  Endo/Heme/Allergies:  Negative for polydipsia.  Psychiatric/Behavioral:  Negative for depression. The patient is not nervous/anxious.     Objective:   BP 116/78   Pulse 89   Temp 98.2 F (36.8 C) (Temporal)   Resp 16   Ht 6' 1 (1.854 m)   Wt 294 lb 3.2 oz (133.4 kg)   LMP 11/14/2024 (Exact Date)   BMI 38.82 kg/m  Body mass index is 38.82 kg/m.   General Appearance:    Alert, cooperative, no distress, appears stated age  Head:    Normocephalic, without obvious abnormality, atraumatic  Eyes:    PERRL, conjunctiva/corneas clear, EOM's intact, fundi    benign, both eyes  Ears:    Normal TM's and external ear canals, both ears  Nose:   Nares normal, septum midline, mucosa normal, no drainage    or sinus tenderness  Throat:   Lips, mucosa, and tongue normal; teeth and gums normal  Neck:   Supple, symmetrical, trachea midline, no adenopathy;    thyroid :  no enlargement/tenderness/nodules; no carotid   bruit or JVD  Back:     Symmetric, no curvature, ROM normal, no CVA tenderness  Lungs:     Clear to auscultation bilaterally, respirations unlabored  Chest Wall:    No tenderness or deformity   Heart:    Regular rate and rhythm, S1 and S2 normal, no murmur, rub or gallop  Breast Exam:    Deferred   Abdomen:     Soft,  non-tender, bowel sounds active all four quadrants,    no masses, no organomegaly  Genitalia:    Deferred  Extremities:   Extremities normal, atraumatic, no cyanosis or edema  Pulses:   2+ and symmetric all extremities  Skin:   Skin color, texture, turgor normal, no rashes or lesions  Lymph nodes:   Cervical, supraclavicular, and axillary nodes normal  Neurologic:   CNII-XII intact, normal strength, sensation and reflexes    throughout  Assessment/Plan:   Assessment and Plan    General Health Maintenance Discussed exercise, diet, and screenings. Addressed family history of colon cancer. - Encouraged continued exercise and healthy diet. - Discussed colon cancer screening based on family history.   Type 2 diabetes mellitus with hyperglycemia, without long-term current use of insulin  (HCC)  A1c of 6.6. Considering Ozempic  dose increase for weight loss. - Consider increasing Ozempic  dose to 1 mg for weight loss, pending discussion with weight management specialist. - Performed foot exam for diabetic screening. - Referred to eye doctor for diabetic eye exam.  Skin lesion  Recurrent painful boils in groin. Suspect hidradenitis suppurativa. Discussed treatment options. - Prescribed topical antibiotic pads for use at the onset of lesions. - Recommended Penoxyl wash for affected areas.  Menorrhagia with irregular cycle  Bleeding resolved with birth control. Discussed potential surgical intervention if bleeding escalates. - Referred to gynecology for further evaluation and management of fibroids.  Mild intermittent asthma without complication  Requires inhaler refill. - Refilled inhaler prescription.   Lucie Buttner, PA-C [facility]     [1]  Social History Tobacco Use   Smoking status: Former    Types: Cigars   Smokeless tobacco: Never  Vaping Use   Vaping status: Some Days   Substances: THC  Substance Use Topics   Alcohol use: Yes    Comment:  Occasionally   Drug use: Not Currently    Types: Marijuana   "

## 2024-12-12 NOTE — Patient Instructions (Addendum)
 Candice Kane

## 2024-12-22 ENCOUNTER — Ambulatory Visit (INDEPENDENT_AMBULATORY_CARE_PROVIDER_SITE_OTHER): Admitting: Family Medicine

## 2024-12-22 VITALS — BP 123/77 | HR 72 | Temp 98.8°F | Ht 73.0 in | Wt 288.0 lb

## 2024-12-22 DIAGNOSIS — E1165 Type 2 diabetes mellitus with hyperglycemia: Secondary | ICD-10-CM | POA: Diagnosis not present

## 2024-12-22 DIAGNOSIS — E66813 Obesity, class 3: Secondary | ICD-10-CM | POA: Diagnosis not present

## 2024-12-22 DIAGNOSIS — Z7985 Long-term (current) use of injectable non-insulin antidiabetic drugs: Secondary | ICD-10-CM | POA: Diagnosis not present

## 2024-12-22 DIAGNOSIS — Z6838 Body mass index (BMI) 38.0-38.9, adult: Secondary | ICD-10-CM

## 2024-12-22 DIAGNOSIS — D509 Iron deficiency anemia, unspecified: Secondary | ICD-10-CM | POA: Diagnosis not present

## 2024-12-22 DIAGNOSIS — E785 Hyperlipidemia, unspecified: Secondary | ICD-10-CM

## 2024-12-22 DIAGNOSIS — E1169 Type 2 diabetes mellitus with other specified complication: Secondary | ICD-10-CM

## 2024-12-22 DIAGNOSIS — E559 Vitamin D deficiency, unspecified: Secondary | ICD-10-CM | POA: Diagnosis not present

## 2024-12-22 MED ORDER — OZEMPIC (0.25 OR 0.5 MG/DOSE) 2 MG/3ML ~~LOC~~ SOPN: 0.5000 mg | PEN_INJECTOR | SUBCUTANEOUS | 1 refills | Status: AC

## 2024-12-22 MED ORDER — VITAMIN D (ERGOCALCIFEROL) 1.25 MG (50000 UNIT) PO CAPS: 50000.0000 [IU] | ORAL_CAPSULE | ORAL | 0 refills | Status: AC

## 2024-12-23 LAB — CBC WITH DIFFERENTIAL/PLATELET
Basophils Absolute: 0.1 x10E3/uL (ref 0.0–0.2)
Basos: 1 %
EOS (ABSOLUTE): 0 x10E3/uL (ref 0.0–0.4)
Eos: 0 %
Hematocrit: 28.6 % — ABNORMAL LOW (ref 34.0–46.6)
Hemoglobin: 8.7 g/dL — ABNORMAL LOW (ref 11.1–15.9)
Immature Grans (Abs): 0 x10E3/uL (ref 0.0–0.1)
Immature Granulocytes: 0 %
Lymphocytes Absolute: 2.7 x10E3/uL (ref 0.7–3.1)
Lymphs: 27 %
MCH: 23.6 pg — ABNORMAL LOW (ref 26.6–33.0)
MCHC: 30.4 g/dL — ABNORMAL LOW (ref 31.5–35.7)
MCV: 78 fL — ABNORMAL LOW (ref 79–97)
Monocytes Absolute: 0.5 x10E3/uL (ref 0.1–0.9)
Monocytes: 5 %
Neutrophils Absolute: 6.8 x10E3/uL (ref 1.4–7.0)
Neutrophils: 67 %
Platelets: 573 x10E3/uL — ABNORMAL HIGH (ref 150–450)
RBC: 3.69 x10E6/uL — ABNORMAL LOW (ref 3.77–5.28)
RDW: 17.4 % — ABNORMAL HIGH (ref 11.7–15.4)
WBC: 10.1 x10E3/uL (ref 3.4–10.8)

## 2024-12-23 LAB — LIPID PANEL WITH LDL/HDL RATIO
Cholesterol, Total: 213 mg/dL — ABNORMAL HIGH (ref 100–199)
HDL: 52 mg/dL
LDL Chol Calc (NIH): 138 mg/dL — ABNORMAL HIGH (ref 0–99)
LDL/HDL Ratio: 2.7 ratio (ref 0.0–3.2)
Triglycerides: 128 mg/dL (ref 0–149)
VLDL Cholesterol Cal: 23 mg/dL (ref 5–40)

## 2024-12-23 LAB — COMPREHENSIVE METABOLIC PANEL WITH GFR
ALT: 7 IU/L (ref 0–32)
AST: 6 IU/L (ref 0–40)
Albumin: 3.9 g/dL (ref 3.9–4.9)
Alkaline Phosphatase: 92 IU/L (ref 41–116)
BUN/Creatinine Ratio: 11 (ref 9–23)
BUN: 9 mg/dL (ref 6–20)
Bilirubin Total: <0.2 mg/dL (ref 0.0–1.2)
CO2: 22 mmol/L (ref 20–29)
Calcium: 8.8 mg/dL (ref 8.7–10.2)
Chloride: 101 mmol/L (ref 96–106)
Creatinine, Ser: 0.83 mg/dL (ref 0.57–1.00)
Globulin, Total: 2.9 g/dL (ref 1.5–4.5)
Glucose: 99 mg/dL (ref 70–99)
Potassium: 3.9 mmol/L (ref 3.5–5.2)
Sodium: 139 mmol/L (ref 134–144)
Total Protein: 6.8 g/dL (ref 6.0–8.5)
eGFR: 92 mL/min/1.73

## 2024-12-23 LAB — VITAMIN D 25 HYDROXY (VIT D DEFICIENCY, FRACTURES): Vit D, 25-Hydroxy: 35.5 ng/mL (ref 30.0–100.0)

## 2024-12-23 LAB — FE+TIBC+FER+B12+FOLIC+RETIC
Ferritin: 7 ng/mL — ABNORMAL LOW (ref 15–150)
Folate: 13.9 ng/mL
Iron Saturation: 5 % — CL (ref 15–55)
Iron: 23 ug/dL — ABNORMAL LOW (ref 27–159)
Retic Ct Pct: 1.9 % (ref 0.6–2.6)
Total Iron Binding Capacity: 433 ug/dL (ref 250–450)
UIBC: 410 ug/dL (ref 131–425)
Vitamin B-12: 245 pg/mL (ref 232–1245)

## 2024-12-23 LAB — HEMOGLOBIN A1C
Est. average glucose Bld gHb Est-mCnc: 126 mg/dL
Hgb A1c MFr Bld: 6 % — ABNORMAL HIGH (ref 4.8–5.6)

## 2024-12-23 LAB — INSULIN, RANDOM: INSULIN: 17.1 u[IU]/mL (ref 2.6–24.9)

## 2025-01-01 NOTE — Assessment & Plan Note (Signed)
 Significant history of iron  deficiency and microcytic anemia.  Will order CBC and anemia panel today and discuss lab results with patient at next appointment.  Still having consistent symptoms of fatigue.

## 2025-01-01 NOTE — Assessment & Plan Note (Signed)
 Patient has been tolerating prescription strength vitamin D  and needs a refill today.  Will assess response to supplementation with repeating vitamin D  level today.

## 2025-01-01 NOTE — Assessment & Plan Note (Signed)
 Patient has been working on dietary changes for the last few months.  Will repeat fasting lipid panel today and discuss results with patient at next appointment.  Patient's LDL goal is lower given her history of diabetes.

## 2025-01-01 NOTE — Assessment & Plan Note (Signed)
 Prior A1c still not at goal.  Patient has been on GLP-1 injectable therapy since starting program.  Will repeat hemoglobin A1c and fasting insulin  level today.  Of note A1c may be slightly elevated given patient's history of anemia.

## 2025-01-04 ENCOUNTER — Telehealth: Payer: Self-pay | Admitting: Physician Assistant

## 2025-01-04 NOTE — Telephone Encounter (Signed)
 Copied from CRM [id number]. Topic: Referral - Status >> Jan 04, 2025 10:57 AM Mesmerise C wrote: Reason for CRM: Patient stated she called [facility] regarding her referral to schedule an appt and they stated the never received one and to call back the clinic to have it sent over because they don't have any opening until August, but patient stated seen the referral was sent in MyChart and double checked with the location and phone number as well

## 2025-01-23 ENCOUNTER — Ambulatory Visit (INDEPENDENT_AMBULATORY_CARE_PROVIDER_SITE_OTHER): Admitting: Family Medicine
